# Patient Record
Sex: Female | Born: 1935
Health system: Southern US, Community
[De-identification: ages and names within clinical notes are randomized; demographics above are authoritative.]

## PROBLEM LIST (undated history)

## (undated) DIAGNOSIS — L661 Lichen planopilaris, unspecified: Secondary | ICD-10-CM

## (undated) DIAGNOSIS — I10 Essential (primary) hypertension: Secondary | ICD-10-CM

## (undated) DIAGNOSIS — Z8489 Family history of other specified conditions: Secondary | ICD-10-CM

## (undated) DIAGNOSIS — Z9889 Other specified postprocedural states: Secondary | ICD-10-CM

## (undated) DIAGNOSIS — B977 Papillomavirus as the cause of diseases classified elsewhere: Secondary | ICD-10-CM

## (undated) DIAGNOSIS — T8859XA Other complications of anesthesia, initial encounter: Secondary | ICD-10-CM

## (undated) DIAGNOSIS — Z9189 Other specified personal risk factors, not elsewhere classified: Secondary | ICD-10-CM

## (undated) DIAGNOSIS — K5909 Other constipation: Secondary | ICD-10-CM

## (undated) DIAGNOSIS — K5190 Ulcerative colitis, unspecified, without complications: Secondary | ICD-10-CM

## (undated) DIAGNOSIS — K529 Noninfective gastroenteritis and colitis, unspecified: Secondary | ICD-10-CM

## (undated) DIAGNOSIS — M549 Dorsalgia, unspecified: Secondary | ICD-10-CM

## (undated) DIAGNOSIS — R112 Nausea with vomiting, unspecified: Secondary | ICD-10-CM

## (undated) DIAGNOSIS — T4145XA Adverse effect of unspecified anesthetic, initial encounter: Secondary | ICD-10-CM

## (undated) DIAGNOSIS — M1990 Unspecified osteoarthritis, unspecified site: Secondary | ICD-10-CM

## (undated) DIAGNOSIS — M797 Fibromyalgia: Secondary | ICD-10-CM

## (undated) DIAGNOSIS — K219 Gastro-esophageal reflux disease without esophagitis: Secondary | ICD-10-CM

## (undated) HISTORY — PX: OOPHORECTOMY: SHX86

## (undated) HISTORY — DX: Essential (primary) hypertension: I10

## (undated) HISTORY — DX: Noninfective gastroenteritis and colitis, unspecified: K52.9

## (undated) HISTORY — DX: Lichen planopilaris, unspecified: L66.10

## (undated) HISTORY — PX: VAGINAL HYSTERECTOMY: SUR661

## (undated) HISTORY — PX: PELVIC LAPAROSCOPY: SHX162

## (undated) HISTORY — DX: Other specified personal risk factors, not elsewhere classified: Z91.89

## (undated) HISTORY — PX: BREAST SURGERY: SHX581

## (undated) HISTORY — DX: Dorsalgia, unspecified: M54.9

## (undated) HISTORY — PX: CATARACT EXTRACTION: SUR2

## (undated) HISTORY — PX: ABDOMINAL SURGERY: SHX537

## (undated) HISTORY — DX: Papillomavirus as the cause of diseases classified elsewhere: B97.7

## (undated) HISTORY — PX: OTHER SURGICAL HISTORY: SHX169

## (undated) HISTORY — DX: Unspecified osteoarthritis, unspecified site: M19.90

## (undated) HISTORY — DX: Ulcerative colitis, unspecified, without complications: K51.90

## (undated) HISTORY — DX: Lichen planopilaris: L66.1

---

## 1998-09-18 ENCOUNTER — Other Ambulatory Visit: Admission: RE | Admit: 1998-09-18 | Discharge: 1998-09-18 | Payer: Self-pay | Admitting: Obstetrics and Gynecology

## 2001-09-30 ENCOUNTER — Other Ambulatory Visit: Admission: RE | Admit: 2001-09-30 | Discharge: 2001-09-30 | Payer: Self-pay | Admitting: Obstetrics and Gynecology

## 2002-10-22 ENCOUNTER — Other Ambulatory Visit: Admission: RE | Admit: 2002-10-22 | Discharge: 2002-10-22 | Payer: Self-pay | Admitting: Obstetrics and Gynecology

## 2003-11-04 ENCOUNTER — Other Ambulatory Visit: Admission: RE | Admit: 2003-11-04 | Discharge: 2003-11-04 | Payer: Self-pay | Admitting: Obstetrics and Gynecology

## 2004-11-06 ENCOUNTER — Other Ambulatory Visit: Admission: RE | Admit: 2004-11-06 | Discharge: 2004-11-06 | Payer: Self-pay | Admitting: Obstetrics and Gynecology

## 2005-11-07 ENCOUNTER — Other Ambulatory Visit: Admission: RE | Admit: 2005-11-07 | Discharge: 2005-11-07 | Payer: Self-pay | Admitting: Obstetrics and Gynecology

## 2006-03-07 ENCOUNTER — Encounter: Admission: RE | Admit: 2006-03-07 | Discharge: 2006-03-07 | Payer: Self-pay | Admitting: Otolaryngology

## 2006-04-26 ENCOUNTER — Emergency Department (HOSPITAL_COMMUNITY): Admission: EM | Admit: 2006-04-26 | Discharge: 2006-04-26 | Payer: Self-pay | Admitting: Emergency Medicine

## 2006-11-10 ENCOUNTER — Other Ambulatory Visit: Admission: RE | Admit: 2006-11-10 | Discharge: 2006-11-10 | Payer: Self-pay | Admitting: Obstetrics and Gynecology

## 2007-11-18 ENCOUNTER — Other Ambulatory Visit: Admission: RE | Admit: 2007-11-18 | Discharge: 2007-11-18 | Payer: Self-pay | Admitting: Obstetrics and Gynecology

## 2008-11-21 ENCOUNTER — Ambulatory Visit: Payer: Self-pay | Admitting: Obstetrics and Gynecology

## 2008-11-21 ENCOUNTER — Other Ambulatory Visit: Admission: RE | Admit: 2008-11-21 | Discharge: 2008-11-21 | Payer: Self-pay | Admitting: Obstetrics and Gynecology

## 2008-11-21 ENCOUNTER — Encounter: Payer: Self-pay | Admitting: Obstetrics and Gynecology

## 2009-11-23 ENCOUNTER — Other Ambulatory Visit: Admission: RE | Admit: 2009-11-23 | Discharge: 2009-11-23 | Payer: Self-pay | Admitting: Obstetrics and Gynecology

## 2009-11-23 ENCOUNTER — Ambulatory Visit: Payer: Self-pay | Admitting: Obstetrics and Gynecology

## 2010-11-27 ENCOUNTER — Other Ambulatory Visit: Payer: Self-pay | Admitting: Obstetrics and Gynecology

## 2010-11-27 ENCOUNTER — Encounter (INDEPENDENT_AMBULATORY_CARE_PROVIDER_SITE_OTHER): Payer: Medicare Other | Admitting: Obstetrics and Gynecology

## 2010-11-27 ENCOUNTER — Other Ambulatory Visit (HOSPITAL_COMMUNITY)
Admission: RE | Admit: 2010-11-27 | Discharge: 2010-11-27 | Disposition: A | Discharge status: Home / Self Care | Payer: Medicare Other | Source: Ambulatory Visit | Attending: Obstetrics and Gynecology | Admitting: Obstetrics and Gynecology

## 2010-11-27 DIAGNOSIS — N766 Ulceration of vulva: Secondary | ICD-10-CM

## 2010-11-27 DIAGNOSIS — N393 Stress incontinence (female) (male): Secondary | ICD-10-CM

## 2010-11-27 DIAGNOSIS — N952 Postmenopausal atrophic vaginitis: Secondary | ICD-10-CM

## 2010-11-27 DIAGNOSIS — Z124 Encounter for screening for malignant neoplasm of cervix: Secondary | ICD-10-CM | POA: Insufficient documentation

## 2010-11-27 DIAGNOSIS — R82998 Other abnormal findings in urine: Secondary | ICD-10-CM

## 2010-12-26 ENCOUNTER — Other Ambulatory Visit: Payer: Self-pay | Admitting: Obstetrics and Gynecology

## 2011-05-16 ENCOUNTER — Other Ambulatory Visit: Payer: Self-pay | Admitting: Dermatology

## 2011-05-16 DIAGNOSIS — L659 Nonscarring hair loss, unspecified: Secondary | ICD-10-CM | POA: Diagnosis not present

## 2011-05-16 DIAGNOSIS — L253 Unspecified contact dermatitis due to other chemical products: Secondary | ICD-10-CM | POA: Diagnosis not present

## 2011-05-16 DIAGNOSIS — L259 Unspecified contact dermatitis, unspecified cause: Secondary | ICD-10-CM | POA: Diagnosis not present

## 2011-05-16 DIAGNOSIS — L219 Seborrheic dermatitis, unspecified: Secondary | ICD-10-CM | POA: Diagnosis not present

## 2011-05-30 DIAGNOSIS — L439 Lichen planus, unspecified: Secondary | ICD-10-CM | POA: Diagnosis not present

## 2011-05-30 DIAGNOSIS — Z79899 Other long term (current) drug therapy: Secondary | ICD-10-CM | POA: Diagnosis not present

## 2011-06-18 DIAGNOSIS — I8310 Varicose veins of unspecified lower extremity with inflammation: Secondary | ICD-10-CM | POA: Diagnosis not present

## 2011-08-15 DIAGNOSIS — H40019 Open angle with borderline findings, low risk, unspecified eye: Secondary | ICD-10-CM | POA: Diagnosis not present

## 2011-08-15 DIAGNOSIS — H179 Unspecified corneal scar and opacity: Secondary | ICD-10-CM | POA: Diagnosis not present

## 2011-08-15 DIAGNOSIS — H353190 Nonexudative age-related macular degeneration, unspecified eye, stage unspecified: Secondary | ICD-10-CM | POA: Diagnosis not present

## 2011-08-15 DIAGNOSIS — H43399 Other vitreous opacities, unspecified eye: Secondary | ICD-10-CM | POA: Diagnosis not present

## 2011-08-15 DIAGNOSIS — H2510 Age-related nuclear cataract, unspecified eye: Secondary | ICD-10-CM | POA: Diagnosis not present

## 2011-08-15 DIAGNOSIS — Z961 Presence of intraocular lens: Secondary | ICD-10-CM | POA: Diagnosis not present

## 2011-08-19 DIAGNOSIS — L439 Lichen planus, unspecified: Secondary | ICD-10-CM | POA: Diagnosis not present

## 2011-08-19 DIAGNOSIS — L299 Pruritus, unspecified: Secondary | ICD-10-CM | POA: Diagnosis not present

## 2011-09-04 DIAGNOSIS — M1710 Unilateral primary osteoarthritis, unspecified knee: Secondary | ICD-10-CM | POA: Diagnosis not present

## 2011-09-04 DIAGNOSIS — M76899 Other specified enthesopathies of unspecified lower limb, excluding foot: Secondary | ICD-10-CM | POA: Diagnosis not present

## 2011-11-18 DIAGNOSIS — L439 Lichen planus, unspecified: Secondary | ICD-10-CM | POA: Diagnosis not present

## 2011-11-19 ENCOUNTER — Encounter: Payer: Self-pay | Admitting: Gynecology

## 2011-11-19 DIAGNOSIS — M1990 Unspecified osteoarthritis, unspecified site: Secondary | ICD-10-CM | POA: Insufficient documentation

## 2011-11-28 DIAGNOSIS — H1045 Other chronic allergic conjunctivitis: Secondary | ICD-10-CM | POA: Diagnosis not present

## 2011-11-28 DIAGNOSIS — H40019 Open angle with borderline findings, low risk, unspecified eye: Secondary | ICD-10-CM | POA: Diagnosis not present

## 2011-11-28 DIAGNOSIS — H179 Unspecified corneal scar and opacity: Secondary | ICD-10-CM | POA: Diagnosis not present

## 2011-12-03 DIAGNOSIS — M1710 Unilateral primary osteoarthritis, unspecified knee: Secondary | ICD-10-CM | POA: Diagnosis not present

## 2011-12-09 DIAGNOSIS — Z1231 Encounter for screening mammogram for malignant neoplasm of breast: Secondary | ICD-10-CM | POA: Diagnosis not present

## 2011-12-12 ENCOUNTER — Encounter: Payer: Self-pay | Admitting: Obstetrics and Gynecology

## 2011-12-12 ENCOUNTER — Ambulatory Visit (INDEPENDENT_AMBULATORY_CARE_PROVIDER_SITE_OTHER): Payer: Medicare Other | Admitting: Obstetrics and Gynecology

## 2011-12-12 VITALS — BP 156/98 | Ht 62.0 in | Wt 174.0 lb

## 2011-12-12 DIAGNOSIS — N760 Acute vaginitis: Secondary | ICD-10-CM | POA: Diagnosis not present

## 2011-12-12 DIAGNOSIS — N9089 Other specified noninflammatory disorders of vulva and perineum: Secondary | ICD-10-CM | POA: Diagnosis not present

## 2011-12-12 DIAGNOSIS — L661 Lichen planopilaris: Secondary | ICD-10-CM | POA: Insufficient documentation

## 2011-12-12 DIAGNOSIS — N393 Stress incontinence (female) (male): Secondary | ICD-10-CM

## 2011-12-12 DIAGNOSIS — I10 Essential (primary) hypertension: Secondary | ICD-10-CM | POA: Insufficient documentation

## 2011-12-12 DIAGNOSIS — N952 Postmenopausal atrophic vaginitis: Secondary | ICD-10-CM | POA: Diagnosis not present

## 2011-12-12 MED ORDER — CLOBETASOL PROPIONATE 0.05 % EX CREA: TOPICAL_CREAM | Freq: Two times a day (BID) | CUTANEOUS | Status: DC

## 2011-12-12 NOTE — Progress Notes (Signed)
Patient came to see me today for further followup. She has recurrent episodes of vulvitis and uses Temovate ointment as needed. She is having no vaginal bleeding. She is having no pelvic pain. She is having trouble with her blood pressure and was slightly elevated today. She is working with Dr. Valentina Lucks. She does have atrophic vaginitis but does well without medication. She is not sexually active. She does have loss of urine with coughing and sneezing but not enough to require surgical intervention. She has done Kegel exercises. She took DES when she was pregnant with her daughter. She has never had an abnormal Pap smear. She had a vaginal hysterectomy for dysfunctional uterine bleeding. She has had normal mammogram. She has had normal bone densities. She is getting ready to have knee replacement surgery due to osteoarthritis.  ROS: 12 system review done. Pertinent positives above.  HEENT: Within normal limits.Kennon Portela present. Neck: No masses. Supraclavicular lymph nodes: Not enlarged. Breasts: Examined in both sitting and lying position. Symmetrical without skin changes or masses. Abdomen: Soft no masses guarding or rebound. No hernias. Pelvic: External within normal limits. BUS within normal limits. Vaginal examination shows poor  estrogen effect, no cystocele enterocele or rectocele. Cervix and uterus absent. Adnexa within normal limits. Rectovaginal confirmatory. Extremities within normal limits.  Assessment: #1. Vulvitis #2. Hypertension #3. Urinary stress incontinence #4. Atrophic vaginitis #5. DES exposure during pregnancy.  Plan: Continue Temovate ointment. Continue yearly mammogram. Hypertensive followup with Dr. Valentina Lucks.

## 2011-12-13 ENCOUNTER — Encounter: Payer: Self-pay | Admitting: Obstetrics and Gynecology

## 2011-12-30 DIAGNOSIS — I8310 Varicose veins of unspecified lower extremity with inflammation: Secondary | ICD-10-CM | POA: Diagnosis not present

## 2012-01-16 DIAGNOSIS — I8310 Varicose veins of unspecified lower extremity with inflammation: Secondary | ICD-10-CM | POA: Diagnosis not present

## 2012-01-16 DIAGNOSIS — M79609 Pain in unspecified limb: Secondary | ICD-10-CM | POA: Diagnosis not present

## 2012-01-31 DIAGNOSIS — M79609 Pain in unspecified limb: Secondary | ICD-10-CM | POA: Diagnosis not present

## 2012-02-25 DIAGNOSIS — Z23 Encounter for immunization: Secondary | ICD-10-CM | POA: Diagnosis not present

## 2012-02-27 DIAGNOSIS — M79609 Pain in unspecified limb: Secondary | ICD-10-CM | POA: Diagnosis not present

## 2012-02-27 DIAGNOSIS — I8310 Varicose veins of unspecified lower extremity with inflammation: Secondary | ICD-10-CM | POA: Diagnosis not present

## 2012-03-12 DIAGNOSIS — M79609 Pain in unspecified limb: Secondary | ICD-10-CM | POA: Diagnosis not present

## 2012-03-12 DIAGNOSIS — I8310 Varicose veins of unspecified lower extremity with inflammation: Secondary | ICD-10-CM | POA: Diagnosis not present

## 2012-03-12 DIAGNOSIS — M7981 Nontraumatic hematoma of soft tissue: Secondary | ICD-10-CM | POA: Diagnosis not present

## 2012-03-26 ENCOUNTER — Other Ambulatory Visit: Payer: Self-pay | Admitting: Orthopedic Surgery

## 2012-03-26 MED ORDER — DEXAMETHASONE SODIUM PHOSPHATE 10 MG/ML IJ SOLN: 10.0000 mg | Freq: Once | INTRAMUSCULAR | Status: DC

## 2012-03-26 MED ORDER — BUPIVACAINE 0.25 % ON-Q PUMP SINGLE CATH 300ML: 300.0000 mL | INJECTION | Status: DC

## 2012-03-26 NOTE — Progress Notes (Signed)
Preoperative surgical orders have been place into the Epic hospital system for Rock Nephew on 03/26/2012, 11:40 AM  by Patrica Duel for surgery on 05/25/12.  Preop Total Knee orders including Bupivacaine On-Q pump, IV Tylenol, and IV Decadron as long as there are no contraindications to the above medications. Avel Peace, PA-C

## 2012-04-02 DIAGNOSIS — E559 Vitamin D deficiency, unspecified: Secondary | ICD-10-CM | POA: Diagnosis not present

## 2012-04-02 DIAGNOSIS — Z0000 Encounter for general adult medical examination without abnormal findings: Secondary | ICD-10-CM | POA: Diagnosis not present

## 2012-04-02 DIAGNOSIS — K219 Gastro-esophageal reflux disease without esophagitis: Secondary | ICD-10-CM | POA: Diagnosis not present

## 2012-04-02 DIAGNOSIS — I10 Essential (primary) hypertension: Secondary | ICD-10-CM | POA: Diagnosis not present

## 2012-05-11 ENCOUNTER — Other Ambulatory Visit: Payer: Self-pay | Admitting: Orthopedic Surgery

## 2012-05-11 NOTE — H&P (Signed)
Crystal Ramsey  DOB: 10/06/1935 Married / Language: English / Race: White Female  Date of Admission:  05/25/2012  Chief Complaint:  Right Knee Pain  History of Present Illness The patient is a 76 year old female who comes in for a preoperative History and Physical. The patient is scheduled for a right total knee arthroplasty to be performed by Dr. Frank V. Aluisio, MD at [facility] on 05/25/2012. The patient is a 76 year old female who presents for follow up of their knee. The patient is being followed for their right osteoarthritis. They are now 4 month(s) out from last injection. Symptoms reported today include: pain. The patient feels that they are doing poorly and report their pain level to be mild to moderate. The patient has reported improvement of their symptoms with: conservative measures and Cortisone injections. Note for "Follow-up Knee": She is scheduled for knee replacement by Dr. Aluisio. She states that she's had multiple injections in the past with Crystal Ramsey. She's had an injection every couple of months. She said the knee is not having a tremendous amount of pain but is leading to significant dysfunction. Prior to the previous cortisone injections, she did have more pain. She's at a stage now where the knee is definitely limiting what she can and can't do. She is ready to get it fixed. They have been treated conservatively in the past for the above stated problem and despite conservative measures, they continue to have progressive pain and severe functional limitations and dysfunction. They have failed non-operative management including home exercise, medications, and injections. It is felt that they would benefit from undergoing total joint replacement. Risks and benefits of the procedure have been discussed with the patient and they elect to proceed with surgery. There are no active contraindications to surgery such as ongoing infection or rapidly  progressive neurological disease.   Problem List Osteoarthrosis NOS, lower leg (715.96). 07/13/1987   Allergies Dyes. eye exam dyes Demerol *ANALGESICS - OPIOID* Sulfa Drugs Zithromax *MACROLIDES* Dilaudid *ANALGESICS - OPIOID* Penicillins   Family History Osteoporosis. sister Cancer. mother, sister and brother Osteoarthritis. father, sister, brother and grandmother mothers side Hypertension. father and sister   Social History Living situation. live with spouse Illicit drug use. no Previously in rehab. no Pain Contract. no Exercise. Exercises rarely Children. 1 Alcohol use. current drinker; drinks wine; 8-14 per week Drug/Alcohol Rehab (Currently). no Current work status. retired Tobacco / smoke exposure. yes outdoors only Number of flights of stairs before winded. less than 1 Marital status. married Tobacco use. former smoker; smoke(d) 3 or more pack(s) per day Post-Surgical Plans. Plan is to look into skilled rehab facility. Advance Directives. Living Will, Healthcare POA   Medication History Amiloride-Hydrochlorothiazide (5-50MG Tablet, Oral) Active. Aspirin Buffered (325MG Tablet, Oral) Active. PriLOSEC (20MG Capsule DR, Oral) Active. Benadryl Allergy (12.5MG/5ML Liquid, Oral) Active. Potassium (99MG Tablet, Oral) Active. Vitamin D (1000UNIT Capsule, Oral) Active. Multivital ( Oral) Active. Senna ( Oral) Active. Tums ( Oral) Specific dose unknown - Active. Patanol (0.1% Solution, Ophthalmic) Active.   Pregnancy / Birth History Pregnant. no   Past Surgical History Breast Biopsy. right Dilation and Curettage of Uterus Cataract Surgery. left Hysterectomy. partial (non-cancerous) Tonsillectomy   Medical History Osteoarthritis Chronic Pain Autoimmune disorder Gastroesophageal Reflux Disease High blood pressure Fibromyalgia Depression Impaired Hearing Cataract Varicose veins Menopause Mumps   Review of  Systems General:Not Present- Chills, Fever, Night Sweats, Fatigue, Weight Gain, Weight Loss and Memory Loss. Skin:Not Present- Hives, Itching, Rash, Eczema and   Lesions. HEENT:Present- Hearing Loss. Not Present- Tinnitus, Headache, Double Vision, Visual Loss and Dentures. Respiratory:Present- Shortness of breath with exertion. Not Present- Shortness of breath at rest, Allergies, Coughing up blood and Chronic Cough. Cardiovascular:Not Present- Chest Pain, Racing/skipping heartbeats, Difficulty Breathing Lying Down, Murmur, Swelling and Palpitations. Gastrointestinal:Present- Heartburn and Constipation. Not Present- Bloody Stool, Abdominal Pain, Vomiting, Nausea, Diarrhea, Difficulty Swallowing, Jaundice and Loss of appetitie. Female Genitourinary:Not Present- Blood in Urine, Urinary frequency, Weak urinary stream, Discharge, Flank Pain, Incontinence, Painful Urination, Urgency, Urinary Retention and Urinating at Night. Musculoskeletal:Present- Muscle Pain, Joint Swelling, Joint Pain, Back Pain and Morning Stiffness. Not Present- Muscle Weakness and Spasms. Neurological:Not Present- Tremor, Dizziness, Blackout spells, Paralysis, Difficulty with balance and Weakness. Psychiatric:Not Present- Insomnia.   Vitals Weight: 165 lb Height: 62.5 in Body Surface Area: 1.82 m Body Mass Index: 29.7 kg/m Pulse: 72 (Regular) Resp.: 12 (Unlabored) BP: 182/88 (Sitting, Left Arm, Standard)    Physical Exam The physical exam findings are as follows:  Note: Patient is a 76 year old female with continued knee pain.   General Mental Status - Alert, cooperative and good historian. General Appearance- pleasant. Not in acute distress. Orientation- Oriented X3. Build & Nutrition- Well nourished and Well developed.   Head and Neck Head- normocephalic, atraumatic . Neck Global Assessment- supple. no bruit auscultated on the right and no bruit auscultated on the  left.   Eye Pupil- Bilateral- Regular and Round. Note: wears glasses Motion- Bilateral- EOMI.   Chest and Lung Exam Auscultation: Breath sounds:- clear at anterior chest wall and - clear at posterior chest wall. Adventitious sounds:- No Adventitious sounds.   Cardiovascular Auscultation:Rhythm- Regular rate and rhythm. Heart Sounds- S1 WNL and S2 WNL. Murmurs & Other Heart Sounds:Auscultation of the heart reveals - No Murmurs.   Abdomen Palpation/Percussion:Tenderness- Abdomen is non-tender to palpation. Rigidity (guarding)- Abdomen is soft. Auscultation:Auscultation of the abdomen reveals - Bowel sounds normal.   Female Genitourinary Not done, not pertinent to present illness  Musculoskeletal On exam, well-developed female, alert and oriented, in no apparent distress. Both hips have normal ROM with no discomfort. Her left knee has no effusion. Range is about 0-135 but no swelling, tenderness or instability.  Right knee: No effusion. Range is 10-120. Marked crepitus on ROM. Varus deformity. Tender medial greater than lateral with no instability.  Pulses, sensation and motor are intact, both lower extremities.  RADIOGRAPHS: Radiographs are reviewed from last December, AP of both knees and lateral of the right, showing severe end stage arthritis in the medial and patellofemoral compartments of the right knee.  Assessment & Plan Osteoarthrosis NOS, lower leg (715.96) Impression: Left Knee  Note: Plan is for a Right Total Knee Repalcement by Dr. Aluisio.  Plan is to look into Rehab.  PCP - Dr. John Griffin - Patient has been seen preoperatively and felt to be stable for surgery.  Signed electronically by DREW L PERKINS, PA-C  

## 2012-05-12 ENCOUNTER — Encounter (HOSPITAL_COMMUNITY): Payer: Self-pay | Admitting: Pharmacy Technician

## 2012-05-15 ENCOUNTER — Ambulatory Visit (HOSPITAL_COMMUNITY)
Admission: RE | Admit: 2012-05-15 | Discharge: 2012-05-15 | Disposition: A | Discharge status: Home / Self Care | Payer: Medicare Other | Source: Ambulatory Visit | Attending: Orthopedic Surgery | Admitting: Orthopedic Surgery

## 2012-05-15 ENCOUNTER — Encounter (HOSPITAL_COMMUNITY): Payer: Self-pay

## 2012-05-15 ENCOUNTER — Encounter (HOSPITAL_COMMUNITY)
Admission: RE | Admit: 2012-05-15 | Discharge: 2012-05-15 | Disposition: A | Discharge status: Home / Self Care | Payer: Medicare Other | Source: Ambulatory Visit | Attending: Orthopedic Surgery | Admitting: Orthopedic Surgery

## 2012-05-15 DIAGNOSIS — R918 Other nonspecific abnormal finding of lung field: Secondary | ICD-10-CM | POA: Diagnosis not present

## 2012-05-15 DIAGNOSIS — R9431 Abnormal electrocardiogram [ECG] [EKG]: Secondary | ICD-10-CM | POA: Diagnosis not present

## 2012-05-15 DIAGNOSIS — Z01810 Encounter for preprocedural cardiovascular examination: Secondary | ICD-10-CM | POA: Diagnosis not present

## 2012-05-15 DIAGNOSIS — I10 Essential (primary) hypertension: Secondary | ICD-10-CM | POA: Diagnosis not present

## 2012-05-15 DIAGNOSIS — Z87891 Personal history of nicotine dependence: Secondary | ICD-10-CM | POA: Diagnosis not present

## 2012-05-15 DIAGNOSIS — Z01818 Encounter for other preprocedural examination: Secondary | ICD-10-CM | POA: Insufficient documentation

## 2012-05-15 DIAGNOSIS — I517 Cardiomegaly: Secondary | ICD-10-CM | POA: Insufficient documentation

## 2012-05-15 DIAGNOSIS — Z01812 Encounter for preprocedural laboratory examination: Secondary | ICD-10-CM | POA: Insufficient documentation

## 2012-05-15 HISTORY — DX: Other constipation: K59.09

## 2012-05-15 HISTORY — DX: Other specified postprocedural states: R11.2

## 2012-05-15 HISTORY — DX: Adverse effect of unspecified anesthetic, initial encounter: T41.45XA

## 2012-05-15 HISTORY — DX: Gastro-esophageal reflux disease without esophagitis: K21.9

## 2012-05-15 HISTORY — DX: Other complications of anesthesia, initial encounter: T88.59XA

## 2012-05-15 HISTORY — DX: Other specified postprocedural states: Z98.890

## 2012-05-15 HISTORY — DX: Fibromyalgia: M79.7

## 2012-05-15 LAB — URINALYSIS, ROUTINE W REFLEX MICROSCOPIC
Ketones, ur: NEGATIVE mg/dL
Nitrite: NEGATIVE
Protein, ur: NEGATIVE mg/dL
Urobilinogen, UA: 0.2 mg/dL (ref 0.0–1.0)
pH: 5.5 (ref 5.0–8.0)

## 2012-05-15 LAB — CBC
MCH: 31.7 pg (ref 26.0–34.0)
MCV: 94.3 fL (ref 78.0–100.0)
Platelets: 272 10*3/uL (ref 150–400)
RBC: 4.35 MIL/uL (ref 3.87–5.11)
RDW: 13.3 % (ref 11.5–15.5)

## 2012-05-15 LAB — COMPREHENSIVE METABOLIC PANEL
AST: 20 U/L (ref 0–37)
CO2: 31 mEq/L (ref 19–32)
Calcium: 9.7 mg/dL (ref 8.4–10.5)
Creatinine, Ser: 0.65 mg/dL (ref 0.50–1.10)
GFR calc non Af Amer: 85 mL/min — ABNORMAL LOW (ref >90)

## 2012-05-15 LAB — URINE MICROSCOPIC-ADD ON

## 2012-05-15 LAB — PROTIME-INR: INR: 0.97 (ref 0.00–1.49)

## 2012-05-15 NOTE — Patient Instructions (Addendum)
Crystal Ramsey  05/15/2012                           YOUR PROCEDURE IS SCHEDULED ON:  05/25/12               PLEASE REPORT TO SHORT STAY CENTER AT :  5:15 AM               CALL THIS NUMBER IF ANY PROBLEMS THE DAY OF SURGERY :               [phone]                      REMEMBER:   Do not eat food or drink liquids AFTER MIDNIGHT   Take these medicines the morning of surgery with A SIP OF WATER:  PRILOSEC   Do not wear jewelry, make-up   Do not wear lotions, powders, or perfumes.   Do not shave legs or underarms 12 hrs. before surgery (men may shave face)  Do not bring valuables to the hospital.  Contacts, dentures or bridgework may not be worn into surgery.  Leave suitcase in the car. After surgery it may be brought to your room.  For patients admitted to the hospital more than one night, checkout time is 11:00                          The day of discharge.   Patients discharged the day of surgery will not be allowed to drive home                             If going home same day of surgery, must have someone stay with you first                           24 hrs at home and arrange for some one to drive you home from hospital.    Special Instructions:   Please read over the following fact sheets that you were given:               1. MRSA  INFORMATION                      2. [name] PREPARING FOR SURGERY SHEET               3. INCENTIVE SPIROMETER                                                X_____________________________________________________________________        Failure to follow these instructions may result in cancellation of your surgery

## 2012-05-15 NOTE — Progress Notes (Signed)
05/15/12 1128  OBSTRUCTIVE SLEEP APNEA  Have you ever been diagnosed with sleep apnea through a sleep study? No  Do you snore loudly (loud enough to be heard through closed doors)?  0  Do you often feel tired, fatigued, or sleepy during the daytime? 1  Has anyone observed you stop breathing during your sleep? 1  Do you have, or are you being treated for high blood pressure? 1  BMI more than 35 kg/m2? 0  Age over 75 years old? 1  Neck circumference greater than 40 cm/18 inches? 0  Gender: 0  Obstructive Sleep Apnea Score 4   Score 4 or greater  Results sent to PCP

## 2012-05-15 NOTE — Progress Notes (Signed)
CXR and UA faxed to Dr. Lequita Halt - confirmation recieved

## 2012-05-18 DIAGNOSIS — I10 Essential (primary) hypertension: Secondary | ICD-10-CM | POA: Diagnosis not present

## 2012-05-20 NOTE — Progress Notes (Signed)
Received order from Dr. Lequita Halt to repeat CXR the morning of surgery. Order placed into EPIC

## 2012-05-25 ENCOUNTER — Encounter (HOSPITAL_COMMUNITY)
Admission: RE | Disposition: A | Discharge status: Skilled Nursing Facility | Payer: Self-pay | Source: Ambulatory Visit | Attending: Orthopedic Surgery

## 2012-05-25 ENCOUNTER — Encounter (HOSPITAL_COMMUNITY): Payer: Self-pay | Admitting: Orthopedic Surgery

## 2012-05-25 ENCOUNTER — Inpatient Hospital Stay (HOSPITAL_COMMUNITY)
Admission: RE | Admit: 2012-05-25 | Discharge: 2012-05-28 | DRG: 470 | Disposition: A | Discharge status: Skilled Nursing Facility | Payer: Medicare Other | Source: Ambulatory Visit | Attending: Orthopedic Surgery | Admitting: Orthopedic Surgery

## 2012-05-25 ENCOUNTER — Encounter (HOSPITAL_COMMUNITY): Payer: Self-pay | Admitting: *Deleted

## 2012-05-25 ENCOUNTER — Inpatient Hospital Stay (HOSPITAL_COMMUNITY): Payer: Medicare Other

## 2012-05-25 ENCOUNTER — Inpatient Hospital Stay (HOSPITAL_COMMUNITY): Payer: Medicare Other | Admitting: *Deleted

## 2012-05-25 DIAGNOSIS — F329 Major depressive disorder, single episode, unspecified: Secondary | ICD-10-CM | POA: Diagnosis present

## 2012-05-25 DIAGNOSIS — Z471 Aftercare following joint replacement surgery: Secondary | ICD-10-CM | POA: Diagnosis not present

## 2012-05-25 DIAGNOSIS — Z79899 Other long term (current) drug therapy: Secondary | ICD-10-CM

## 2012-05-25 DIAGNOSIS — K219 Gastro-esophageal reflux disease without esophagitis: Secondary | ICD-10-CM | POA: Diagnosis present

## 2012-05-25 DIAGNOSIS — IMO0002 Reserved for concepts with insufficient information to code with codable children: Secondary | ICD-10-CM | POA: Diagnosis not present

## 2012-05-25 DIAGNOSIS — M359 Systemic involvement of connective tissue, unspecified: Secondary | ICD-10-CM | POA: Diagnosis present

## 2012-05-25 DIAGNOSIS — E871 Hypo-osmolality and hyponatremia: Secondary | ICD-10-CM

## 2012-05-25 DIAGNOSIS — Z880 Allergy status to penicillin: Secondary | ICD-10-CM | POA: Diagnosis not present

## 2012-05-25 DIAGNOSIS — R269 Unspecified abnormalities of gait and mobility: Secondary | ICD-10-CM | POA: Diagnosis not present

## 2012-05-25 DIAGNOSIS — IMO0001 Reserved for inherently not codable concepts without codable children: Secondary | ICD-10-CM | POA: Diagnosis present

## 2012-05-25 DIAGNOSIS — L439 Lichen planus, unspecified: Secondary | ICD-10-CM | POA: Diagnosis present

## 2012-05-25 DIAGNOSIS — G8929 Other chronic pain: Secondary | ICD-10-CM | POA: Diagnosis present

## 2012-05-25 DIAGNOSIS — J984 Other disorders of lung: Secondary | ICD-10-CM | POA: Diagnosis not present

## 2012-05-25 DIAGNOSIS — M179 Osteoarthritis of knee, unspecified: Secondary | ICD-10-CM

## 2012-05-25 DIAGNOSIS — M1990 Unspecified osteoarthritis, unspecified site: Secondary | ICD-10-CM | POA: Diagnosis not present

## 2012-05-25 DIAGNOSIS — D62 Acute posthemorrhagic anemia: Secondary | ICD-10-CM

## 2012-05-25 DIAGNOSIS — I10 Essential (primary) hypertension: Secondary | ICD-10-CM | POA: Diagnosis not present

## 2012-05-25 DIAGNOSIS — Z96659 Presence of unspecified artificial knee joint: Secondary | ICD-10-CM | POA: Diagnosis not present

## 2012-05-25 DIAGNOSIS — M1710 Unilateral primary osteoarthritis, unspecified knee: Secondary | ICD-10-CM | POA: Diagnosis not present

## 2012-05-25 DIAGNOSIS — E876 Hypokalemia: Secondary | ICD-10-CM

## 2012-05-25 DIAGNOSIS — Z01818 Encounter for other preprocedural examination: Secondary | ICD-10-CM | POA: Diagnosis not present

## 2012-05-25 DIAGNOSIS — M6281 Muscle weakness (generalized): Secondary | ICD-10-CM | POA: Diagnosis not present

## 2012-05-25 DIAGNOSIS — M25569 Pain in unspecified knee: Secondary | ICD-10-CM | POA: Diagnosis not present

## 2012-05-25 DIAGNOSIS — K2100 Gastro-esophageal reflux disease with esophagitis, without bleeding: Secondary | ICD-10-CM | POA: Diagnosis not present

## 2012-05-25 DIAGNOSIS — F3289 Other specified depressive episodes: Secondary | ICD-10-CM | POA: Diagnosis present

## 2012-05-25 HISTORY — PX: TOTAL KNEE ARTHROPLASTY: SHX125

## 2012-05-25 LAB — ABO/RH: ABO/RH(D): O POS

## 2012-05-25 SURGERY — ARTHROPLASTY, KNEE, TOTAL
Anesthesia: Spinal | Site: Knee | Laterality: Right | Wound class: Clean

## 2012-05-25 MED ORDER — ONDANSETRON HCL 4 MG/2ML IJ SOLN
4.0000 mg | Freq: Four times a day (QID) | INTRAMUSCULAR | Status: DC | PRN
  Administered 2012-05-25 – 2012-05-26 (×3): 4 mg via INTRAVENOUS
  Filled 2012-05-25 (×3): qty 2

## 2012-05-25 MED ORDER — DOCUSATE SODIUM 100 MG PO CAPS
100.0000 mg | ORAL_CAPSULE | Freq: Two times a day (BID) | ORAL | Status: DC
  Administered 2012-05-25 – 2012-05-28 (×5): 100 mg via ORAL

## 2012-05-25 MED ORDER — OXYCODONE HCL 5 MG PO TABS
5.0000 mg | ORAL_TABLET | ORAL | Status: DC | PRN
  Administered 2012-05-25 – 2012-05-26 (×4): 10 mg via ORAL
  Filled 2012-05-25 (×4): qty 2

## 2012-05-25 MED ORDER — RIVAROXABAN 10 MG PO TABS
10.0000 mg | ORAL_TABLET | Freq: Every day | ORAL | Status: DC
  Administered 2012-05-26 – 2012-05-28 (×3): 10 mg via ORAL
  Filled 2012-05-25 (×5): qty 1

## 2012-05-25 MED ORDER — BUPIVACAINE LIPOSOME 1.3 % IJ SUSP
20.0000 mL | Freq: Once | INTRAMUSCULAR | Status: AC
  Administered 2012-05-25: 20 mL
  Filled 2012-05-25: qty 20

## 2012-05-25 MED ORDER — PROMETHAZINE HCL 25 MG/ML IJ SOLN: 6.2500 mg | INTRAMUSCULAR | Status: DC | PRN

## 2012-05-25 MED ORDER — DEXAMETHASONE SODIUM PHOSPHATE 10 MG/ML IJ SOLN: 10.0000 mg | Freq: Once | INTRAMUSCULAR | Status: AC

## 2012-05-25 MED ORDER — METOCLOPRAMIDE HCL 10 MG PO TABS: 5.0000 mg | ORAL_TABLET | Freq: Three times a day (TID) | ORAL | Status: DC | PRN

## 2012-05-25 MED ORDER — CLOBETASOL PROPIONATE 0.05 % EX CREA
TOPICAL_CREAM | Freq: Two times a day (BID) | CUTANEOUS | Status: DC
  Filled 2012-05-25: qty 15

## 2012-05-25 MED ORDER — FENTANYL CITRATE 0.05 MG/ML IJ SOLN
INTRAMUSCULAR | Status: DC | PRN
  Administered 2012-05-25 (×2): 50 ug via INTRAVENOUS

## 2012-05-25 MED ORDER — OLOPATADINE HCL 0.1 % OP SOLN
1.0000 [drp] | Freq: Two times a day (BID) | OPHTHALMIC | Status: DC
  Filled 2012-05-25: qty 5

## 2012-05-25 MED ORDER — POLYETHYLENE GLYCOL 3350 17 G PO PACK: 17.0000 g | PACK | Freq: Every day | ORAL | Status: DC | PRN

## 2012-05-25 MED ORDER — MORPHINE SULFATE 2 MG/ML IJ SOLN
INTRAMUSCULAR | Status: AC
  Administered 2012-05-25: 1 mg via INTRAVENOUS
  Filled 2012-05-25: qty 1

## 2012-05-25 MED ORDER — LACTATED RINGERS IV SOLN: INTRAVENOUS | Status: DC

## 2012-05-25 MED ORDER — MORPHINE SULFATE 2 MG/ML IJ SOLN
1.0000 mg | INTRAMUSCULAR | Status: DC | PRN
  Administered 2012-05-25: 1 mg via INTRAVENOUS
  Administered 2012-05-25: 2 mg via INTRAVENOUS
  Filled 2012-05-25: qty 1

## 2012-05-25 MED ORDER — CHLORHEXIDINE GLUCONATE 4 % EX LIQD
60.0000 mL | Freq: Once | CUTANEOUS | Status: DC
  Filled 2012-05-25: qty 60

## 2012-05-25 MED ORDER — KCL IN DEXTROSE-NACL 20-5-0.9 MEQ/L-%-% IV SOLN
INTRAVENOUS | Status: DC
  Administered 2012-05-25 – 2012-05-26 (×2): via INTRAVENOUS
  Filled 2012-05-25 (×3): qty 1000

## 2012-05-25 MED ORDER — OMEPRAZOLE MAGNESIUM 20 MG PO TBEC: 20.0000 mg | DELAYED_RELEASE_TABLET | ORAL | Status: DC

## 2012-05-25 MED ORDER — BISACODYL 10 MG RE SUPP: 10.0000 mg | Freq: Every day | RECTAL | Status: DC | PRN

## 2012-05-25 MED ORDER — DEXAMETHASONE 6 MG PO TABS
10.0000 mg | ORAL_TABLET | Freq: Once | ORAL | Status: AC
  Administered 2012-05-26: 10 mg via ORAL
  Filled 2012-05-25: qty 1

## 2012-05-25 MED ORDER — METHOCARBAMOL 500 MG PO TABS
500.0000 mg | ORAL_TABLET | Freq: Four times a day (QID) | ORAL | Status: DC | PRN
  Administered 2012-05-25 – 2012-05-26 (×3): 500 mg via ORAL
  Filled 2012-05-25 (×5): qty 1

## 2012-05-25 MED ORDER — ACETAMINOPHEN 650 MG RE SUPP: 650.0000 mg | Freq: Four times a day (QID) | RECTAL | Status: DC | PRN

## 2012-05-25 MED ORDER — POTASSIUM CHLORIDE 20 MEQ PO PACK: 20.0000 meq | PACK | Freq: Every day | ORAL | Status: DC

## 2012-05-25 MED ORDER — SODIUM CHLORIDE 0.9 % IR SOLN
Status: DC | PRN
  Administered 2012-05-25: 1000 mL

## 2012-05-25 MED ORDER — ACETAMINOPHEN 10 MG/ML IV SOLN
1000.0000 mg | Freq: Four times a day (QID) | INTRAVENOUS | Status: AC
  Administered 2012-05-25 – 2012-05-26 (×4): 1000 mg via INTRAVENOUS
  Filled 2012-05-25 (×8): qty 100

## 2012-05-25 MED ORDER — POTASSIUM CHLORIDE CRYS ER 20 MEQ PO TBCR
20.0000 meq | EXTENDED_RELEASE_TABLET | Freq: Every day | ORAL | Status: DC
  Administered 2012-05-28: 20 meq via ORAL
  Filled 2012-05-25 (×4): qty 1

## 2012-05-25 MED ORDER — AMILORIDE-HYDROCHLOROTHIAZIDE 5-50 MG PO TABS
0.5000 | ORAL_TABLET | Freq: Every day | ORAL | Status: DC
  Administered 2012-05-26 – 2012-05-27 (×2): 0.5 via ORAL

## 2012-05-25 MED ORDER — METOCLOPRAMIDE HCL 5 MG/ML IJ SOLN
5.0000 mg | Freq: Three times a day (TID) | INTRAMUSCULAR | Status: DC | PRN
  Administered 2012-05-25: 10 mg via INTRAVENOUS
  Filled 2012-05-25: qty 2

## 2012-05-25 MED ORDER — ONDANSETRON HCL 4 MG/2ML IJ SOLN
INTRAMUSCULAR | Status: DC | PRN
  Administered 2012-05-25: 4 mg via INTRAVENOUS

## 2012-05-25 MED ORDER — VANCOMYCIN HCL IN DEXTROSE 1-5 GM/200ML-% IV SOLN
1000.0000 mg | Freq: Once | INTRAVENOUS | Status: AC
  Administered 2012-05-25: 1000 mg via INTRAVENOUS
  Filled 2012-05-25: qty 200

## 2012-05-25 MED ORDER — ACETAMINOPHEN 10 MG/ML IV SOLN: INTRAVENOUS | Status: DC | PRN

## 2012-05-25 MED ORDER — FLEET ENEMA 7-19 GM/118ML RE ENEM: 1.0000 | ENEMA | Freq: Once | RECTAL | Status: AC | PRN

## 2012-05-25 MED ORDER — ACETAMINOPHEN 10 MG/ML IV SOLN
1000.0000 mg | Freq: Once | INTRAVENOUS | Status: AC
  Administered 2012-05-25: 1000 mg via INTRAVENOUS

## 2012-05-25 MED ORDER — ONDANSETRON HCL 4 MG PO TABS: 4.0000 mg | ORAL_TABLET | Freq: Four times a day (QID) | ORAL | Status: DC | PRN

## 2012-05-25 MED ORDER — METHOCARBAMOL 100 MG/ML IJ SOLN
500.0000 mg | Freq: Four times a day (QID) | INTRAVENOUS | Status: DC | PRN
  Administered 2012-05-25: 500 mg via INTRAVENOUS
  Filled 2012-05-25: qty 5

## 2012-05-25 MED ORDER — DIPHENHYDRAMINE HCL 12.5 MG/5ML PO ELIX: 12.5000 mg | ORAL_SOLUTION | ORAL | Status: DC | PRN

## 2012-05-25 MED ORDER — BUPIVACAINE IN DEXTROSE 0.75-8.25 % IT SOLN
INTRATHECAL | Status: DC | PRN
  Administered 2012-05-25: 1.5 mL via INTRATHECAL

## 2012-05-25 MED ORDER — SODIUM CHLORIDE 0.9 % IV SOLN: INTRAVENOUS | Status: DC

## 2012-05-25 MED ORDER — MIDAZOLAM HCL 5 MG/5ML IJ SOLN
INTRAMUSCULAR | Status: DC | PRN
  Administered 2012-05-25: 2 mg via INTRAVENOUS

## 2012-05-25 MED ORDER — LACTATED RINGERS IV SOLN
INTRAVENOUS | Status: DC | PRN
  Administered 2012-05-25 (×2): via INTRAVENOUS

## 2012-05-25 MED ORDER — SODIUM CHLORIDE 0.9 % IV SOLN
1500.0000 mg | INTRAVENOUS | Status: AC
  Administered 2012-05-25: 1500 mg via INTRAVENOUS
  Filled 2012-05-25: qty 1500

## 2012-05-25 MED ORDER — TRIAMTERENE-HCTZ 75-50 MG PO TABS
1.0000 | ORAL_TABLET | Freq: Every day | ORAL | Status: DC
  Filled 2012-05-25: qty 1

## 2012-05-25 MED ORDER — TRAMADOL HCL 50 MG PO TABS
50.0000 mg | ORAL_TABLET | Freq: Four times a day (QID) | ORAL | Status: DC | PRN
  Administered 2012-05-26 (×2): 50 mg via ORAL
  Filled 2012-05-25 (×3): qty 1

## 2012-05-25 MED ORDER — ACETAMINOPHEN 325 MG PO TABS: 650.0000 mg | ORAL_TABLET | Freq: Four times a day (QID) | ORAL | Status: DC | PRN

## 2012-05-25 MED ORDER — SODIUM CHLORIDE 0.9 % IJ SOLN
INTRAMUSCULAR | Status: DC | PRN
  Administered 2012-05-25: 50 mL

## 2012-05-25 MED ORDER — BUPIVACAINE ON-Q PAIN PUMP (FOR ORDER SET NO CHG): INJECTION | Status: DC

## 2012-05-25 MED ORDER — PROPOFOL 10 MG/ML IV EMUL
INTRAVENOUS | Status: DC | PRN
  Administered 2012-05-25: 100 ug/kg/min via INTRAVENOUS

## 2012-05-25 MED ORDER — KETAMINE HCL 10 MG/ML IJ SOLN
INTRAMUSCULAR | Status: DC | PRN
  Administered 2012-05-25 (×2): 10 mg via INTRAVENOUS

## 2012-05-25 MED ORDER — PANTOPRAZOLE SODIUM 40 MG PO TBEC
40.0000 mg | DELAYED_RELEASE_TABLET | Freq: Every day | ORAL | Status: DC
  Filled 2012-05-25 (×2): qty 1

## 2012-05-25 MED ORDER — MENTHOL 3 MG MT LOZG: 1.0000 | LOZENGE | OROMUCOSAL | Status: DC | PRN

## 2012-05-25 MED ORDER — 0.9 % SODIUM CHLORIDE (POUR BTL) OPTIME
TOPICAL | Status: DC | PRN
  Administered 2012-05-25: 1000 mL

## 2012-05-25 MED ORDER — PHENOL 1.4 % MT LIQD: 1.0000 | OROMUCOSAL | Status: DC | PRN

## 2012-05-25 SURGICAL SUPPLY — 59 items
BAG SPEC THK2 15X12 ZIP CLS (MISCELLANEOUS) ×1
BAG ZIPLOCK 12X15 (MISCELLANEOUS) ×2 IMPLANT
BANDAGE ELASTIC 6 VELCRO ST LF (GAUZE/BANDAGES/DRESSINGS) ×2 IMPLANT
BANDAGE ESMARK 6X9 LF (GAUZE/BANDAGES/DRESSINGS) ×1 IMPLANT
BLADE SAG 18X100X1.27 (BLADE) ×2 IMPLANT
BLADE SAW SGTL 11.0X1.19X90.0M (BLADE) ×2 IMPLANT
BNDG CMPR 9X6 STRL LF SNTH (GAUZE/BANDAGES/DRESSINGS) ×1
BNDG ESMARK 6X9 LF (GAUZE/BANDAGES/DRESSINGS) ×2
BOWL SMART MIX CTS (DISPOSABLE) ×1 IMPLANT
CATH KIT ON-Q SILVERSOAK 5 (CATHETERS) ×1 IMPLANT
CATH KIT ON-Q SILVERSOAK 5IN (CATHETERS) IMPLANT
CEMENT HV SMART SET (Cement) ×4 IMPLANT
CLOTH BEACON ORANGE TIMEOUT ST (SAFETY) ×2 IMPLANT
CUFF TOURN SGL QUICK 34 (TOURNIQUET CUFF) ×2
CUFF TRNQT CYL 34X4X40X1 (TOURNIQUET CUFF) ×1 IMPLANT
DRAPE EXTREMITY T 121X128X90 (DRAPE) ×2 IMPLANT
DRAPE POUCH INSTRU U-SHP 10X18 (DRAPES) ×2 IMPLANT
DRAPE U-SHAPE 47X51 STRL (DRAPES) ×2 IMPLANT
DRSG ADAPTIC 3X8 NADH LF (GAUZE/BANDAGES/DRESSINGS) ×2 IMPLANT
DURAPREP 26ML APPLICATOR (WOUND CARE) ×2 IMPLANT
ELECT REM PT RETURN 9FT ADLT (ELECTROSURGICAL) ×2
ELECTRODE REM PT RTRN 9FT ADLT (ELECTROSURGICAL) ×1 IMPLANT
EVACUATOR 1/8 PVC DRAIN (DRAIN) ×2 IMPLANT
FACESHIELD LNG OPTICON STERILE (SAFETY) ×10 IMPLANT
GLOVE BIO SURGEON STRL SZ8 (GLOVE) ×2 IMPLANT
GLOVE BIOGEL PI IND STRL 6.5 (GLOVE) IMPLANT
GLOVE BIOGEL PI IND STRL 8 (GLOVE) ×2 IMPLANT
GLOVE BIOGEL PI INDICATOR 6.5 (GLOVE) ×2
GLOVE BIOGEL PI INDICATOR 8 (GLOVE) ×1
GLOVE ECLIPSE 8.0 STRL XLNG CF (GLOVE) ×1 IMPLANT
GLOVE SURG SS PI 6.5 STRL IVOR (GLOVE) ×6 IMPLANT
GOWN STRL NON-REIN LRG LVL3 (GOWN DISPOSABLE) ×4 IMPLANT
GOWN STRL REIN XL XLG (GOWN DISPOSABLE) ×2 IMPLANT
HANDPIECE INTERPULSE COAX TIP (DISPOSABLE) ×2
IMMOBILIZER KNEE 20 (SOFTGOODS) ×2
IMMOBILIZER KNEE 20 THIGH 36 (SOFTGOODS) ×1 IMPLANT
KIT BASIN OR (CUSTOM PROCEDURE TRAY) ×2 IMPLANT
MANIFOLD NEPTUNE II (INSTRUMENTS) ×2 IMPLANT
NEEDLE HYPO 22GX1.5 SAFETY (NEEDLE) ×1 IMPLANT
NS IRRIG 1000ML POUR BTL (IV SOLUTION) ×2 IMPLANT
PACK TOTAL JOINT (CUSTOM PROCEDURE TRAY) ×2 IMPLANT
PAD ABD 7.5X8 STRL (GAUZE/BANDAGES/DRESSINGS) ×2 IMPLANT
PAD CAST 4YDX4 CTTN HI CHSV (CAST SUPPLIES) IMPLANT
PADDING CAST COTTON 4X4 STRL (CAST SUPPLIES) ×6
PADDING CAST COTTON 6X4 STRL (CAST SUPPLIES) ×3 IMPLANT
POSITIONER SURGICAL ARM (MISCELLANEOUS) ×2 IMPLANT
SET HNDPC FAN SPRY TIP SCT (DISPOSABLE) ×1 IMPLANT
SPONGE GAUZE 4X4 12PLY (GAUZE/BANDAGES/DRESSINGS) ×2 IMPLANT
STRIP CLOSURE SKIN 1/2X4 (GAUZE/BANDAGES/DRESSINGS) ×3 IMPLANT
SUCTION FRAZIER 12FR DISP (SUCTIONS) ×2 IMPLANT
SUT MNCRL AB 4-0 PS2 18 (SUTURE) ×2 IMPLANT
SUT VIC AB 2-0 CT1 27 (SUTURE) ×6
SUT VIC AB 2-0 CT1 TAPERPNT 27 (SUTURE) ×3 IMPLANT
SUT VLOC 180 0 24IN GS25 (SUTURE) ×2 IMPLANT
SYR 20CC LL (SYRINGE) ×1 IMPLANT
TOWEL OR 17X26 10 PK STRL BLUE (TOWEL DISPOSABLE) ×4 IMPLANT
TRAY FOLEY CATH 14FRSI W/METER (CATHETERS) ×2 IMPLANT
WATER STERILE IRR 1500ML POUR (IV SOLUTION) ×3 IMPLANT
WRAP KNEE MAXI GEL POST OP (GAUZE/BANDAGES/DRESSINGS) ×3 IMPLANT

## 2012-05-25 NOTE — Interval H&P Note (Signed)
History and Physical Interval Note:  05/25/2012 6:59 AM  Crystal Ramsey  has presented today for surgery, with the diagnosis of Osteoarthritis of the Right knee  The various methods of treatment have been discussed with the patient and family. After consideration of risks, benefits and other options for treatment, the patient has consented to  Procedure(s) (LRB) with comments: TOTAL KNEE ARTHROPLASTY (Right) as a surgical intervention .  The patient's history has been reviewed, patient examined, no change in status, stable for surgery.  I have reviewed the patient's chart and labs.  Questions were answered to the patient's satisfaction.     Loanne Drilling

## 2012-05-25 NOTE — Anesthesia Preprocedure Evaluation (Signed)
Anesthesia Evaluation  Patient identified by MRN, date of birth, ID band Patient awake    Reviewed: Allergy & Precautions, H&P , NPO status , Patient's Chart, lab work & pertinent test results  Airway Mallampati: II TM Distance: >3 FB Neck ROM: Full    Dental No notable dental hx.    Pulmonary neg pulmonary ROS,  breath sounds clear to auscultation  Pulmonary exam normal       Cardiovascular hypertension, Pt. on medications Rhythm:Regular Rate:Normal     Neuro/Psych negative neurological ROS  negative psych ROS   GI/Hepatic Neg liver ROS, GERD-  Medicated,  Endo/Other  negative endocrine ROS  Renal/GU negative Renal ROS  negative genitourinary   Musculoskeletal negative musculoskeletal ROS (+)   Abdominal   Peds negative pediatric ROS (+)  Hematology negative hematology ROS (+)   Anesthesia Other Findings   Reproductive/Obstetrics negative OB ROS                           Anesthesia Physical Anesthesia Plan  ASA: II  Anesthesia Plan: Spinal   Post-op Pain Management:    Induction:   Airway Management Planned: Simple Face Mask  Additional Equipment:   Intra-op Plan:   Post-operative Plan:   Informed Consent: I have reviewed the patients History and Physical, chart, labs and discussed the procedure including the risks, benefits and alternatives for the proposed anesthesia with the patient or authorized representative who has indicated his/her understanding and acceptance.     Plan Discussed with: CRNA and Surgeon  Anesthesia Plan Comments:         Anesthesia Quick Evaluation

## 2012-05-25 NOTE — Evaluation (Signed)
Physical Therapy Evaluation Patient Details Name: Crystal Ramsey MRN: [id number] DOB: 1935/07/01 Today's Date: 05/25/2012 Time: 0865-7846 PT Time Calculation (min): 26 min  PT Assessment / Plan / Recommendation Clinical Impression  pt is s/p right TKA POD # 0 today and will benefit from continued PT to maximize independence for transition to skilled rehab post acute    PT Assessment  Patient needs continued PT services    Follow Up Recommendations  SNF    Does the patient have the potential to tolerate intense rehabilitation      Barriers to Discharge        Equipment Recommendations  Rolling walker with 5" wheels    Recommendations for Other Services     Frequency 7X/week    Precautions / Restrictions Precautions Precautions: Knee Required Braces or Orthoses: Knee Immobilizer - Right Knee Immobilizer - Right: Discontinue once straight leg raise with < 10 degree lag Restrictions RLE Weight Bearing: Weight bearing as tolerated   Pertinent Vitals/Pain       Mobility  Bed Mobility Bed Mobility: Supine to Sit;Sit to Supine;Sitting - Scoot to Edge of Bed Supine to Sit: 4: Min assist Sitting - Scoot to [place] of Bed: 4: Min assist Sit to Supine: 4: Min assist Details for Bed Mobility Assistance: pt with n/v on EOB; uable to transfer to chair;  Transfers Transfers: Not assessed (due to n/v)    Shoulder Instructions     Exercises Total Joint Exercises Ankle Circles/Pumps: AROM;Both;10 reps Quad Sets: AROM;Right;5 reps   PT Diagnosis: Difficulty walking  PT Problem List: Decreased strength;Decreased range of motion;Decreased activity tolerance;Decreased mobility;Decreased knowledge of use of DME PT Treatment Interventions: DME instruction;Gait training;Functional mobility training;Therapeutic activities;Therapeutic exercise;Patient/family education   PT Goals Acute Rehab PT Goals PT Goal Formulation: With patient Time For Goal Achievement: 06/01/12 Potential to  Achieve Goals: Good Pt will go Supine/Side to Sit: with supervision PT Goal: Supine/Side to Sit - Progress: Goal set today Pt will go Sit to Supine/Side: with supervision PT Goal: Sit to Supine/Side - Progress: Goal set today Pt will go Sit to Stand: with supervision PT Goal: Sit to Stand - Progress: Goal set today Pt will go Stand to Sit: with supervision Pt will Ambulate: 51 - 150 feet;with supervision;with rolling walker PT Goal: Ambulate - Progress: Goal set today Pt will Perform Home Exercise Program: with supervision, verbal cues required/provided PT Goal: Perform Home Exercise Program - Progress: Goal set today  Visit Information  Last PT Received On: 05/25/12 Assistance Needed: +1    Subjective Data  Subjective: i ambetter Patient Stated Goal: to [employer]   Prior Functioning  Home Living Available Help at Discharge: Skilled Nursing Facility Home Adaptive Equipment: None Prior Function Level of Independence: Independent Driving: Yes    Cognition  Overall Cognitive Status: Appears within functional limits for tasks assessed/performed Arousal/Alertness: Awake/alert Orientation Level: Appears intact for tasks assessed Behavior During Session: [facility] for tasks performed    Extremity/Trunk Assessment Right Upper Extremity Assessment RUE ROM/Strength/Tone: [facility] for tasks assessed Left Upper Extremity Assessment LUE ROM/Strength/Tone: [facility] for tasks assessed Right Lower Extremity Assessment RLE ROM/Strength/Tone: Deficits RLE ROM/Strength/Tone Deficits: able to assist with SLR; ankle grossly WFL; knee flexed to ~35 degrees; encouraaged ext and ankle pumps Left Lower Extremity Assessment LLE ROM/Strength/Tone: [facility] for tasks assessed   Balance Static Sitting Balance Static Sitting - Balance Support: No upper extremity supported;Feet supported Static Sitting - Level of Assistance: 5: Stand by assistance  End of Session PT - End of  Session Equipment Utilized During  Treatment: Right knee immobilizer Activity Tolerance: Treatment limited secondary to medical complications (Comment) Patient left: in bed;with call bell/phone within reach;with nursing in room Nurse Communication: Other (comment) (n/v)  GP     [facility] 05/25/2012, 5:12 PM

## 2012-05-25 NOTE — Addendum Note (Signed)
Addendum  created 05/25/12 1610 by Bevelyn Buckles, CRNA   Modules edited:Charges VN

## 2012-05-25 NOTE — Anesthesia Postprocedure Evaluation (Signed)
  Anesthesia Post-op Note  Patient: Crystal Ramsey  Procedure(s) Performed: Procedure(s) (LRB): TOTAL KNEE ARTHROPLASTY (Right)  Patient Location: PACU  Anesthesia Type: Spinal  Level of Consciousness: awake and alert   Airway and Oxygen Therapy: Patient Spontanous Breathing  Post-op Pain: mild  Post-op Assessment: Post-op Vital signs reviewed, Patient's Cardiovascular Status Stable, Respiratory Function Stable, Patent Airway and No signs of Nausea or vomiting  Last Vitals:  Filed Vitals:   05/25/12 0522  BP: 169/73  Pulse: 93  Temp: 36.8 C  Resp: 18    Post-op Vital Signs: stable   Complications: No apparent anesthesia complications

## 2012-05-25 NOTE — Progress Notes (Signed)
Pt became itchy this morning from CHG soap and scratched her Rt knee. Note posted on front of chart for Dr. Lequita Halt to examine in holding room

## 2012-05-25 NOTE — Transfer of Care (Signed)
Immediate Anesthesia Transfer of Care Note  Patient: Crystal Ramsey  Procedure(s) Performed: Procedure(s) (LRB) with comments: TOTAL KNEE ARTHROPLASTY (Right)  Patient Location: PACU  Anesthesia Type:Regional  Level of Consciousness: awake, alert  and oriented  Airway & Oxygen Therapy: Patient Spontanous Breathing and Patient connected to face mask oxygen  Post-op Assessment: Report given to PACU RN and Post -op Vital signs reviewed and stable  Post vital signs: Reviewed and stable  Complications: No apparent anesthesia complications

## 2012-05-25 NOTE — Progress Notes (Signed)
Clinical Social Work Department BRIEF PSYCHOSOCIAL ASSESSMENT 05/25/2012  Patient:  Crystal Ramsey, Crystal Ramsey     Account Number:  [id number]     Admit date:  05/25/2012  Clinical Social Worker: Cori Razor, CLINICAL SOCIAL WORKER  Date/Time:  05/25/2012 04:03 PM  Referred by:  Physician  Date Referred:  05/25/2012 Referred for  SNF Placement   Other Referral:   Interview type:  Patient Other interview type:    PSYCHOSOCIAL DATA Living Status:  HUSBAND Admitted from facility:   Level of care:  Skilled Nursing Facility Primary support name:  Sammantha Mehlhaff Primary support relationship to patient:  SPOUSE Degree of support available:   supportive    CURRENT CONCERNS Current Concerns  Post-Acute Placement   Other Concerns:    SOCIAL WORK ASSESSMENT / PLAN Pt is a 77 yr old female living at home prior to hospitalization. CSW met with pt and spouse to assist with d/c planning. Pt has made prior arrangements to have ST Rehab at [facility] following hospital d/c. CSW contacted SNF and d/c plan has been confirmed. Will follow to assist with d/c planning to SNF.   Assessment/plan status:  Psychosocial Support/Ongoing Assessment of Needs Other assessment/ plan:   Information/referral to community resources:   None needed at this time.    PATIENT'S/FAMILY'S RESPONSE TO PLAN OF CARE: Pt is looking forward to having ST Rehab at [facility].   Cori Razor LCSW [phone]

## 2012-05-25 NOTE — Anesthesia Procedure Notes (Signed)
Spinal  Patient location during procedure: OR End time: 05/25/2012 7:15 AM Staffing CRNA/Resident: Enriqueta Shutter Performed by: anesthesiologist and resident/CRNA  Preanesthetic Checklist Completed: patient identified, site marked, surgical consent, pre-op evaluation, timeout performed, IV checked, risks and benefits discussed and monitors and equipment checked Spinal Block Patient position: sitting Prep: Betadine Patient monitoring: heart rate, continuous pulse ox and blood pressure Approach: midline Location: L3-4 Injection technique: single-shot Needle Needle type: Sprotte  Needle gauge: 24 G Needle length: 9 cm Assessment Sensory level: T6 Additional Notes Expiration date of kit checked and confirmed. Patient tolerated procedure well, without complications.

## 2012-05-25 NOTE — H&P (View-Only) (Signed)
Crystal Ramsey  DOB: 1936-01-16 Married / Language: English / Race: White Female  Date of Admission:  05/25/2012  Chief Complaint:  Right Knee Pain  History of Present Illness The patient is a 77 year old female who comes in for a preoperative History and Physical. The patient is scheduled for a right total knee arthroplasty to be performed by Dr. Gus Rankin. Aluisio, MD at [facility] on 05/25/2012. The patient is a 77 year old female who presents for follow up of their knee. The patient is being followed for their right osteoarthritis. They are now 4 month(s) out from last injection. Symptoms reported today include: pain. The patient feels that they are doing poorly and report their pain level to be mild to moderate. The patient has reported improvement of their symptoms with: conservative measures and Cortisone injections. Note for "Follow-up Knee": She is scheduled for knee replacement by Dr. Lequita Halt. She states that she's had multiple injections in the past with Dr. Penni Bombard. She's had an injection every couple of months. She said the knee is not having a tremendous amount of pain but is leading to significant dysfunction. Prior to the previous cortisone injections, she did have more pain. She's at a stage now where the knee is definitely limiting what she can and can't do. She is ready to get it fixed. They have been treated conservatively in the past for the above stated problem and despite conservative measures, they continue to have progressive pain and severe functional limitations and dysfunction. They have failed non-operative management including home exercise, medications, and injections. It is felt that they would benefit from undergoing total joint replacement. Risks and benefits of the procedure have been discussed with the patient and they elect to proceed with surgery. There are no active contraindications to surgery such as ongoing infection or rapidly  progressive neurological disease.   Problem List Osteoarthrosis NOS, lower leg (715.96). 07/13/1987   Allergies Dyes. eye exam dyes Demerol *ANALGESICS - OPIOID* Sulfa Drugs Zithromax *MACROLIDES* Dilaudid *ANALGESICS - OPIOID* Penicillins   Family History Osteoporosis. sister Cancer. mother, sister and brother Osteoarthritis. father, sister, brother and grandmother mothers side Hypertension. father and sister   Social History Living situation. live with spouse Illicit drug use. no Previously in rehab. no Pain Contract. no Exercise. Exercises rarely Children. 1 Alcohol use. current drinker; drinks wine; 8-14 per week Drug/Alcohol Rehab (Currently). no Current work status. retired Tobacco / smoke exposure. yes outdoors only Number of flights of stairs before winded. less than 1 Marital status. married Tobacco use. former smoker; smoke(d) 3 or more pack(s) per day Post-Surgical Plans. Plan is to look into skilled rehab facility. Advance Directives. Living Will, Healthcare POA   Medication History Amiloride-Hydrochlorothiazide (5-50MG  Tablet, Oral) Active. Aspirin Buffered (325MG  Tablet, Oral) Active. PriLOSEC (20MG  Capsule DR, Oral) Active. Benadryl Allergy (12.5MG /5ML Liquid, Oral) Active. Potassium (99MG  Tablet, Oral) Active. Vitamin D (1000UNIT Capsule, Oral) Active. Multivital ( Oral) Active. Senna ( Oral) Active. Tums ( Oral) Specific dose unknown - Active. Patanol (0.1% Solution, Ophthalmic) Active.   Pregnancy / Birth History Pregnant. no   Past Surgical History Breast Biopsy. right Dilation and Curettage of Uterus Cataract Surgery. left Hysterectomy. partial (non-cancerous) Tonsillectomy   Medical History Osteoarthritis Chronic Pain Autoimmune disorder Gastroesophageal Reflux Disease High blood pressure Fibromyalgia Depression Impaired Hearing Cataract Varicose veins Menopause Mumps   Review of  Systems General:Not Present- Chills, Fever, Night Sweats, Fatigue, Weight Gain, Weight Loss and Memory Loss. Skin:Not Present- Hives, Itching, Rash, Eczema and  Lesions. HEENT:Present- Hearing Loss. Not Present- Tinnitus, Headache, Double Vision, Visual Loss and Dentures. Respiratory:Present- Shortness of breath with exertion. Not Present- Shortness of breath at rest, Allergies, Coughing up blood and Chronic Cough. Cardiovascular:Not Present- Chest Pain, Racing/skipping heartbeats, Difficulty Breathing Lying Down, Murmur, Swelling and Palpitations. Gastrointestinal:Present- Heartburn and Constipation. Not Present- Bloody Stool, Abdominal Pain, Vomiting, Nausea, Diarrhea, Difficulty Swallowing, Jaundice and Loss of appetitie. Female Genitourinary:Not Present- Blood in Urine, Urinary frequency, Weak urinary stream, Discharge, Flank Pain, Incontinence, Painful Urination, Urgency, Urinary Retention and Urinating at Night. Musculoskeletal:Present- Muscle Pain, Joint Swelling, Joint Pain, Back Pain and Morning Stiffness. Not Present- Muscle Weakness and Spasms. Neurological:Not Present- Tremor, Dizziness, Blackout spells, Paralysis, Difficulty with balance and Weakness. Psychiatric:Not Present- Insomnia.   Vitals Weight: 165 lb Height: 62.5 in Body Surface Area: 1.82 m Body Mass Index: 29.7 kg/m Pulse: 72 (Regular) Resp.: 12 (Unlabored) BP: 182/88 (Sitting, Left Arm, Standard)    Physical Exam The physical exam findings are as follows:  Note: Patient is a 77 year old female with continued knee pain.   General Mental Status - Alert, cooperative and good historian. General Appearance- pleasant. Not in acute distress. Orientation- Oriented X3. Build & Nutrition- Well nourished and Well developed.   Head and Neck Head- normocephalic, atraumatic . Neck Global Assessment- supple. no bruit auscultated on the right and no bruit auscultated on the  left.   Eye Pupil- Bilateral- Regular and Round. Note: wears glasses Motion- Bilateral- EOMI.   Chest and Lung Exam Auscultation: Breath sounds:- clear at anterior chest wall and - clear at posterior chest wall. Adventitious sounds:- No Adventitious sounds.   Cardiovascular Auscultation:Rhythm- Regular rate and rhythm. Heart Sounds- S1 WNL and S2 WNL. Murmurs & Other Heart Sounds:Auscultation of the heart reveals - No Murmurs.   Abdomen Palpation/Percussion:Tenderness- Abdomen is non-tender to palpation. Rigidity (guarding)- Abdomen is soft. Auscultation:Auscultation of the abdomen reveals - Bowel sounds normal.   Female Genitourinary Not done, not pertinent to present illness  Musculoskeletal On exam, well-developed female, alert and oriented, in no apparent distress. Both hips have normal ROM with no discomfort. Her left knee has no effusion. Range is about 0-135 but no swelling, tenderness or instability.  Right knee: No effusion. Range is 10-120. Marked crepitus on ROM. Varus deformity. Tender medial greater than lateral with no instability.  Pulses, sensation and motor are intact, both lower extremities.  RADIOGRAPHS: Radiographs are reviewed from last December, AP of both knees and lateral of the right, showing severe end stage arthritis in the medial and patellofemoral compartments of the right knee.  Assessment & Plan Osteoarthrosis NOS, lower leg (715.96) Impression: Left Knee  Note: Plan is for a Right Total Knee Repalcement by Dr. Lequita Halt.  Plan is to look into Rehab.  PCP - Dr. Kirby Funk - Patient has been seen preoperatively and felt to be stable for surgery.  Signed electronically by Roberts Gaudy, PA-C

## 2012-05-25 NOTE — Op Note (Addendum)
Pre-operative diagnosis- Osteoarthritis  Right knee(s)  Post-operative diagnosis- Osteoarthritis Right knee(s)  Procedure-  Right  Total Knee Arthroplasty  Surgeon- Gus Rankin. Suleika Donavan, MD  Assistant- Dimitri Ped, PA-C   Anesthesia-  Spinal EBL-* No blood loss amount entered *  Drains Hemovac  Tourniquet time-  Total Tourniquet Time Documented: Thigh (Right) - 35 minutes   Complications- None  Condition-PACU - hemodynamically stable.   Brief Clinical Note  Crystal Ramsey is a 77 y.o. year old female with end stage OA of her right knee with progressively worsening pain and dysfunction. She has constant pain, with activity and at rest and significant functional deficits with difficulties even with ADLs. She has had extensive non-op management including analgesics, injections of cortisone, and home exercise program, but remains in significant pain with significant dysfunction.Radiographs show bone on bone arthritis medial and patellofemoral with large varus deformity. She presents now for right Total Knee Arthroplasty.    Procedure in detail---   The patient is brought into the operating room and positioned supine on the operating table. After successful administration of  Spinal,   a tourniquet is placed high on the  Right thigh(s) and the lower extremity is prepped and draped in the usual sterile fashion. Time out is performed by the operating team and then the  Right lower extremity is wrapped in Esmarch, knee flexed and the tourniquet inflated to 300 mmHg.       A midline incision is made with a ten blade through the subcutaneous tissue to the level of the extensor mechanism. A fresh blade is used to make a medial parapatellar arthrotomy. Soft tissue over the proximal medial tibia is subperiosteally elevated to the joint line with a knife and into the semimembranosus bursa with a Cobb elevator. Soft tissue over the proximal lateral tibia is elevated with attention being paid to  avoiding the patellar tendon on the tibial tubercle. The patella is everted, knee flexed 90 degrees and the ACL and PCL are removed. Findings are bone on bone medial and patellofemoral with large global osteophytes.        The drill is used to create a starting hole in the distal femur and the canal is thoroughly irrigated with sterile saline to remove the fatty contents. The 5 degree Right  valgus alignment guide is placed into the femoral canal and the distal femoral cutting block is pinned to remove 10 mm off the distal femur. Resection is made with an oscillating saw.      The tibia is subluxed forward and the menisci are removed. The extramedullary alignment guide is placed referencing proximally at the medial aspect of the tibial tubercle and distally along the second metatarsal axis and tibial crest. The block is pinned to remove 2mm off the more deficient medial  side. Resection is made with an oscillating saw. Size 2.5is the most appropriate size for the tibia and the proximal tibia is prepared with the modular drill and keel punch for that size.      The femoral sizing guide is placed and size 3 is most appropriate. Rotation is marked off the epicondylar axis and confirmed by creating a rectangular flexion gap at 90 degrees. The size 3 cutting block is pinned in this rotation and the anterior, posterior and chamfer cuts are made with the oscillating saw. The intercondylar block is then placed and that cut is made.      Trial size 2.5 tibial component, trial size 3 posterior stabilized femur and a 12.5  mm posterior stabilized rotating platform insert trial is placed. Full extension is achieved with excellent varus/valgus and anterior/posterior balance throughout full range of motion. The patella is everted and thickness measured to be 22  mm. Free hand resection is taken to 12 mm, a 35 template is placed, lug holes are drilled, trial patella is placed, and it tracks normally. Osteophytes are removed  off the posterior femur with the trial in place. All trials are removed and the cut bone surfaces prepared with pulsatile lavage. Cement is mixed and once ready for implantation, the size 2.5 tibial implant, size  3 posterior stabilized femoral component, and the size 35 patella are cemented in place and the patella is held with the clamp. The trial insert is placed and the knee held in full extension.The Exparel (20 ml mixed with 50 ml saline) is injected into the extensor mechanism, posterior capsule, medial and lateral gutters and subcutaneous tissues All extruded cement is removed and once the cement is hard the permanent 12.5 mm posterior stabilized rotating platform insert is placed into the tibial tray.      The wound is copiously irrigated with saline solution and the extensor mechanism closed over a hemovac drain with #1 PDS suture. The tourniquet is released for a total tourniquet time of 34  minutes. Flexion against gravity is 140 degrees and the patella tracks normally. Subcutaneous tissue is closed with 2.0 vicryl and subcuticular with running 4.0 Monocryl. The incision is cleaned and dried and steri-strips and a bulky sterile dressing are applied. The limb is placed into a knee immobilizer and the patient is awakened and transported to recovery in stable condition.      Please note that a surgical assistant was a medical necessity for this procedure in order to perform it in a safe and expeditious manner. Surgical assistant was necessary to retract the ligaments and vital neurovascular structures to prevent injury to them and also necessary for proper positioning of the limb to allow for anatomic placement of the prosthesis.   Gus Rankin Crystal Postell, MD    05/25/2012, 8:11 AM

## 2012-05-26 ENCOUNTER — Encounter (HOSPITAL_COMMUNITY): Payer: Self-pay | Admitting: Orthopedic Surgery

## 2012-05-26 DIAGNOSIS — E871 Hypo-osmolality and hyponatremia: Secondary | ICD-10-CM

## 2012-05-26 DIAGNOSIS — E876 Hypokalemia: Secondary | ICD-10-CM

## 2012-05-26 LAB — BASIC METABOLIC PANEL
BUN: 11 mg/dL (ref 6–23)
Calcium: 8.3 mg/dL — ABNORMAL LOW (ref 8.4–10.5)
GFR calc Af Amer: >90 mL/min (ref >90)
GFR calc non Af Amer: 89 mL/min — ABNORMAL LOW (ref >90)
Potassium: 3.2 mEq/L — ABNORMAL LOW (ref 3.5–5.1)

## 2012-05-26 LAB — CBC
HCT: 29.4 % — ABNORMAL LOW (ref 36.0–46.0)
MCH: 31.4 pg (ref 26.0–34.0)
MCHC: 33.7 g/dL (ref 30.0–36.0)
RDW: 13.3 % (ref 11.5–15.5)

## 2012-05-26 MED ORDER — OMEPRAZOLE 20 MG PO CPDR
20.0000 mg | DELAYED_RELEASE_CAPSULE | Freq: Every day | ORAL | Status: DC
  Administered 2012-05-27: 20 mg via ORAL
  Filled 2012-05-26 (×3): qty 1

## 2012-05-26 MED ORDER — NON FORMULARY: 20.0000 mg | Freq: Every day | Status: DC

## 2012-05-26 MED ORDER — HYDROMORPHONE HCL 2 MG PO TABS: 2.0000 mg | ORAL_TABLET | ORAL | Status: DC | PRN

## 2012-05-26 MED ORDER — HYDROCODONE-ACETAMINOPHEN 5-325 MG PO TABS
1.0000 | ORAL_TABLET | ORAL | Status: DC | PRN
  Administered 2012-05-26 – 2012-05-28 (×10): 1 via ORAL
  Filled 2012-05-26: qty 1
  Filled 2012-05-26: qty 2
  Filled 2012-05-26 (×7): qty 1
  Filled 2012-05-26: qty 2

## 2012-05-26 MED ORDER — CLONIDINE HCL 0.1 MG PO TABS
0.1000 mg | ORAL_TABLET | Freq: Once | ORAL | Status: AC
  Administered 2012-05-26: 0.1 mg via ORAL
  Filled 2012-05-26 (×2): qty 1

## 2012-05-26 NOTE — Progress Notes (Signed)
Clinical Social Work Department CLINICAL SOCIAL WORK PLACEMENT NOTE 05/26/2012  Patient:  Crystal Ramsey, Crystal Ramsey  Account Number:  [id number] Admit date:  05/25/2012  Clinical Social Worker:  Cori Razor, LCSW  Date/time:  05/25/2012 04:11 PM  Clinical Social Work is seeking post-discharge placement for this patient at the following level of care:   SKILLED NURSING   (*CSW will update this form in Epic as items are completed)   05/25/2012  Patient/family provided with [facility] Department of Clinical Social Work's list of facilities offering this level of care within the geographic area requested by the patient (or if unable, by the patient's family).    Patient/family informed of their freedom to choose among providers that offer the needed level of care, that participate in Medicare, Medicaid or managed care program needed by the patient, have an available bed and are willing to accept the patient.    Patient/family informed of [facility]' ownership interest in [facility], as well as of the fact that they are under no obligation to receive care at this facility.  PASARR submitted to EDS on 05/26/2012 PASARR number received from EDS on   FL2 transmitted to all facilities in geographic area requested by pt/family on  05/26/2012 FL2 transmitted to all facilities within larger geographic area on   Patient informed that his/her managed care company has contracts with or will negotiate with  certain facilities, including the following:     Patient/family informed of bed offers received:  05/26/2012 Patient [facility] bed [facility] Physician recommends and patient chooses bed at    Patient to be transferred to [facility] on   Patient to be transferred to facility by   The following physician request were entered in Epic:   Additional Comments:  Cori Razor LCSW [phone]

## 2012-05-26 NOTE — Progress Notes (Signed)
Physical Therapy Treatment Patient Details Name: Crystal Ramsey MRN: [id number] DOB: May 28, 1935 Today's Date: 05/26/2012 Time: 4098-1191 PT Time Calculation (min): 12 min  PT Assessment / Plan / Recommendation Comments on Treatment Session  POD # 1 pm session R TKR.  Pt anxious to get back into bed "I have been sitting up for 3 hours". Applied KI and assisted back to bed.  Pt stated she was unable to tolerate any further sctivity and requested to rest. Pt plans to D/C to [facility] for ST Rehab.    Follow Up Recommendations  SNF     Does the patient have the potential to tolerate intense rehabilitation     Barriers to Discharge        Equipment Recommendations  Rolling walker with 5" wheels    Recommendations for Other Services    Frequency 7X/week   Plan Discharge plan remains appropriate    Precautions / Restrictions Precautions Precautions: Knee Precaution Comments: Instructed pt on KI use for amb Required Braces or Orthoses: Knee Immobilizer - Right Knee Immobilizer - Right: Discontinue once straight leg raise with < 10 degree lag Restrictions Weight Bearing Restrictions: No RLE Weight Bearing: Weight bearing as tolerated   Pertinent Vitals/Pain C/o max faitigue   Mobility  Bed Mobility Bed Mobility: Sit to Supine Supine to Sit: 4: Min assist Sit to Supine: 4: Min assist;3: Mod assist Details for Bed Mobility Assistance: increased time to position to comfort Transfers Transfers: Sit to Stand;Stand to Sit Sit to Stand: From chair/3-in-1;3: Mod assist Stand to Sit: 4: Min assist;3: Mod assist;To bed Details for Transfer Assistance: 50% VC's on safety and proper tech esp to complete turn prior to sit. Ambulation/Gait Ambulation/Gait Assistance: 4: Min assist Ambulation Distance (Feet): 2 Feet Assistive device: Rolling walker Ambulation/Gait Assistance Details: 50% VC's on proper sequencing and increased time  Pt c/o 8/10 R knee pain during stance. Gait Pattern:  Step-to pattern;Decreased stance time - right Gait velocity: decreased    Exercises Total Joint Exercises Ankle Circles/Pumps: AROM;Both;10 reps;Supine Quad Sets: AROM;Both;10 reps;Supine Gluteal Sets: AROM;Both;10 reps;Supine Towel Squeeze: AROM;Both;10 reps;Supine Heel Slides: AAROM;Right;10 reps;Supine Hip ABduction/ADduction: AAROM;Right;10 reps;Supine Straight Leg Raises: AAROM;Right;10 reps;Supine   PT Goals                                               Progressing slowly    Visit Information  Last PT Received On: 05/26/12 Assistance Needed: +1    Subjective Data  Subjective: I need to go back to bed Patient Stated Goal: n/a   Cognition       Balance     End of Session PT - End of Session Equipment Utilized During Treatment: Gait belt;Right knee immobilizer Activity Tolerance: Patient limited by fatigue Patient left: in bed;with call bell/phone within reach   Felecia Shelling  PTA [facility]  Acute  Rehab Pager     [phone]

## 2012-05-26 NOTE — Progress Notes (Signed)
   Subjective: 1 Day Post-Op Procedure(s) (LRB): TOTAL KNEE ARTHROPLASTY (Right) Patient reports pain as mild.   Patient seen in rounds with Dr. Lequita Halt. Patient is well, and has had no acute complaints or problems We will start therapy today.  Plan is to go Skilled nursing facility after hospital stay.  Objective: Vital signs in last 24 hours: Temp:  [94.6 F (34.8 C)-98.7 F (37.1 C)] 98.7 F (37.1 C) (01/14 0118) Pulse Rate:  [48-88] 75  (01/14 0118) Resp:  [8-17] 15  (01/14 0400) BP: (115-161)/(60-86) 156/72 mmHg (01/14 0118) SpO2:  [95 %-100 %] 95 % (01/14 0400) Weight:  [75.751 kg (167 lb)] 75.751 kg (167 lb) (01/13 0958)  Intake/Output from previous day:  Intake/Output Summary (Last 24 hours) at 05/26/12 0709 Last data filed at 05/26/12 0600  Gross per 24 hour  Intake   3821 ml  Output   2260 ml  Net   1561 ml    Intake/Output this shift: UOP 400 since MN +1411  Labs:  [facility] 05/26/12 0444  HGB 9.9*    Basename 05/26/12 0444  WBC 5.6  RBC 3.15*  HCT 29.4*  PLT 202    Basename 05/26/12 0444  NA 133*  K 3.2*  CL 97  CO2 29  BUN 11  CREATININE 0.58  GLUCOSE 133*  CALCIUM 8.3*   No results found for this basename: LABPT:2,INR:2 in the last 72 hours  EXAM General - Patient is Alert, Appropriate and Oriented Extremity - Neurovascular intact Sensation intact distally Dorsiflexion/Plantar flexion intact Dressing - dressing C/D/I Motor Function - intact, moving foot and toes well on exam.  Hemovac pulled without difficulty.  Past Medical History  Diagnosis Date  . Arthritis   . DUB (dysfunctional uterine bleeding)   . Ovarian cyst   . Hypertension   . Lichen plano-pilaris   . Complication of anesthesia   . PONV (postoperative nausea and vomiting)   . Fibromyalgia     PT THINKS SHE HAS FIBROMYALGIA  . Rash     RECURRENT  . GERD (gastroesophageal reflux disease)   . Constipation, chronic     Assessment/Plan: 1 Day Post-Op  Procedure(s) (LRB): TOTAL KNEE ARTHROPLASTY (Right) Principal Problem:  *OA (osteoarthritis) of knee Active Problems:  Postop Acute blood loss anemia  Postop Hyponatremia  Postop Hypokalemia  Estimated Body mass index is 30.06 kg/(m^2) as calculated from the following:   Height as of this encounter: 5' 2.5"(1.588 m).   Weight as of this encounter: 167 lb(75.751 kg). Advance diet Up with therapy Discharge to SNF - [facility]  DVT Prophylaxis - Xarelto, ASA 325 mg on hold Weight-Bearing as tolerated to right leg No vaccines. D/C O2 and Pulse OX and try on Room Air  PERKINS, ALEXZANDREW 05/26/2012, 7:09 AM

## 2012-05-26 NOTE — Care Management Note (Signed)
    Page 1 of 1   05/26/2012     2:27:59 PM   CARE MANAGEMENT NOTE 05/26/2012  Patient:  Crystal Ramsey, Crystal Ramsey   Account Number:  [id number]  Date Initiated:  05/26/2012  Documentation initiated by:  Colleen Can  Subjective/Objective Assessment:   DX OSTEOARTHRITIS RIGHT KNEE; TOTAL KNEE REPLACEMNT ON DAY OF ADMISSION     Action/Plan:   CURRENT PLANS ARE FOR SNF REHAB   Anticipated DC Date:  05/28/2012   Anticipated DC Plan:  SKILLED NURSING FACILITY  In-house referral  Clinical Social Worker      DC Planning Services  CM consult      Choice offered to / List presented to:             Status of service:  Completed, signed off Medicare Important Message given?  NA - LOS <3 / Initial given by admissions (If response is "NO", the following Medicare IM given date fields will be blank) Date Medicare IM given:   Date Additional Medicare IM given:    Discharge Disposition:    Per UR Regulation:    If discussed at Long Length of Stay Meetings, dates discussed:    Comments:

## 2012-05-26 NOTE — Progress Notes (Signed)
OT Screen  Patient Details Name: Crystal Ramsey MRN: [id number] DOB: Sep 06, 1935   Cancelled Treatment:    Reason Eval/Treat Not Completed: Other (comment) (screen:  will defer OT to SNF)  [facility], OTR/L [phone] 1/14/20141/14/2014, 2:02 PM

## 2012-05-26 NOTE — Progress Notes (Signed)
Physical Therapy Treatment Patient Details Name: Crystal Ramsey MRN: [id number] DOB: 01-02-1936 Today's Date: 05/26/2012 Time: 1308-6578 PT Time Calculation (min): 39 min  PT Assessment / Plan / Recommendation Comments on Treatment Session  POD # 1 am session R TKR.  Pt required increased time and max encouragement to get OOB as she demon mild anxiety/fear about getting nausea.  Pt stated she did vomit this am. Pt only agreed to get in to recliner "for now", then performed TKR TE's. Pt plans to D/C to [facility] for [facility] Rehab.    Follow Up Recommendations  SNF     Does the patient have the potential to tolerate intense rehabilitation     Barriers to Discharge        Equipment Recommendations  Rolling walker with 5" wheels    Recommendations for Other Services    Frequency 7X/week   Plan Discharge plan remains appropriate    Precautions / Restrictions Precautions Precautions: Knee Precaution Comments: Instructed pt on KI use for amb Required Braces or Orthoses: Knee Immobilizer - Right Knee Immobilizer - Right: Discontinue once straight leg raise with < 10 degree lag Restrictions Weight Bearing Restrictions: No RLE Weight Bearing: Weight bearing as tolerated   Pertinent Vitals/Pain C/o 3/10 R knee pain with act ICE applied    Mobility  Bed Mobility Bed Mobility: Supine to Sit Supine to Sit: 4: Min assist Details for Bed Mobility Assistance: min assist to support R LE off bed and increased time Transfers Transfers: Sit to Stand;Stand to Sit Sit to Stand: 4: Min assist;3: Mod assist;From bed;From elevated surface Stand to Sit: 4: Min assist;To chair/3-in-1 Details for Transfer Assistance: increased time due to fear of nausea and 25% VC's on proper hand placement Ambulation/Gait Ambulation/Gait Assistance: 4: Min assist Ambulation Distance (Feet): 2 Feet Assistive device: Rolling walker Ambulation/Gait Assistance Details: 50% VC's on proper sequencing and increased  time with no c/o nasea but max fear of nausea.  Pt only agreed to amb to recliner this am. Gait Pattern: Step-to pattern;Decreased stride length;Trunk flexed Gait velocity: decreased    Exercises Total Joint Exercises Ankle Circles/Pumps: AROM;Both;10 reps;Supine Quad Sets: AROM;Both;10 reps;Supine Gluteal Sets: AROM;Both;10 reps;Supine Towel Squeeze: AROM;Both;10 reps;Supine Heel Slides: AAROM;Right;10 reps;Supine Hip ABduction/ADduction: AAROM;Right;10 reps;Supine Straight Leg Raises: AAROM;Right;10 reps;Supine   PT Goals                                        progressing    Visit Information  Last PT Received On: 05/26/12 Assistance Needed: +1    Subjective Data  Subjective: I won't be able to walk Patient Stated Goal: n/a   Cognition       Balance     End of Session PT - End of Session Equipment Utilized During Treatment: Gait belt;Right knee immobilizer Activity Tolerance: Patient limited by fatigue Patient left: in chair;with call bell/phone within reach (ICE to R knee)   Felecia Shelling  PTA [facility]  Acute  Rehab Pager     [phone]

## 2012-05-27 ENCOUNTER — Ambulatory Visit (HOSPITAL_COMMUNITY): Payer: Medicare Other

## 2012-05-27 DIAGNOSIS — J984 Other disorders of lung: Secondary | ICD-10-CM | POA: Diagnosis not present

## 2012-05-27 LAB — CBC
Hemoglobin: 9.7 g/dL — ABNORMAL LOW (ref 12.0–15.0)
MCH: 31.9 pg (ref 26.0–34.0)
MCV: 92.4 fL (ref 78.0–100.0)
RBC: 3.04 MIL/uL — ABNORMAL LOW (ref 3.87–5.11)
WBC: 8.3 10*3/uL (ref 4.0–10.5)

## 2012-05-27 LAB — BASIC METABOLIC PANEL
CO2: 28 mEq/L (ref 19–32)
Calcium: 8.9 mg/dL (ref 8.4–10.5)
Chloride: 96 mEq/L (ref 96–112)
Glucose, Bld: 112 mg/dL — ABNORMAL HIGH (ref 70–99)
Sodium: 132 mEq/L — ABNORMAL LOW (ref 135–145)

## 2012-05-27 MED ORDER — POTASSIUM CHLORIDE CRYS ER 20 MEQ PO TBCR
40.0000 meq | EXTENDED_RELEASE_TABLET | Freq: Three times a day (TID) | ORAL | Status: AC
  Administered 2012-05-27 (×3): 40 meq via ORAL
  Filled 2012-05-27 (×3): qty 2

## 2012-05-27 NOTE — Progress Notes (Signed)
   Subjective: 2 Days Post-Op Procedure(s) (LRB): TOTAL KNEE ARTHROPLASTY (Right) Patient reports pain as mild.   Patient seen in rounds by Dr. Lequita Halt. Patient is well, and has had no acute complaints or problems Plan is to go Skilled nursing facility after hospital stay.  Objective: Vital signs in last 24 hours: Temp:  [98 F (36.7 C)-98.9 F (37.2 C)] 98.6 F (37 C) (01/15 1300) Pulse Rate:  [69-89] 89  (01/15 1300) Resp:  [16] 16  (01/15 1300) BP: (122-192)/(67-88) 132/67 mmHg (01/15 1300) SpO2:  [95 %-96 %] 96 % (01/15 1300)  Intake/Output from previous day:  Intake/Output Summary (Last 24 hours) at 05/27/12 1332 Last data filed at 05/27/12 1300  Gross per 24 hour  Intake 1046.08 ml  Output   3000 ml  Net -1953.92 ml    Intake/Output this shift: Total I/O In: 480 [P.O.:480] Out: 1500 [Urine:1500]  Labs:  [facility] 05/27/12 0420 05/26/12 0444  HGB 9.7* 9.9*    Basename 05/27/12 0420 05/26/12 0444  WBC 8.3 5.6  RBC 3.04* 3.15*  HCT 28.1* 29.4*  PLT 191 202    Basename 05/27/12 0420 05/26/12 0444  NA 132* 133*  K 3.0* 3.2*  CL 96 97  CO2 28 29  BUN 8 11  CREATININE 0.52 0.58  GLUCOSE 112* 133*  CALCIUM 8.9 8.3*   No results found for this basename: LABPT:2,INR:2 in the last 72 hours  EXAM General - Patient is Alert, Appropriate and Oriented Extremity - Neurovascular intact Sensation intact distally Dorsiflexion/Plantar flexion intact No cellulitis present Dressing/Incision - clean, dry, no drainage, healing Motor Function - intact, moving foot and toes well on exam.   Past Medical History  Diagnosis Date  . Arthritis   . DUB (dysfunctional uterine bleeding)   . Ovarian cyst   . Hypertension   . Lichen plano-pilaris   . Complication of anesthesia   . PONV (postoperative nausea and vomiting)   . Fibromyalgia     PT THINKS SHE HAS FIBROMYALGIA  . Rash     RECURRENT  . GERD (gastroesophageal reflux disease)   . Constipation, chronic      Assessment/Plan: 2 Days Post-Op Procedure(s) (LRB): TOTAL KNEE ARTHROPLASTY (Right) Principal Problem:  *OA (osteoarthritis) of knee Active Problems:  Postop Acute blood loss anemia  Postop Hyponatremia  Postop Hypokalemia  Estimated Body mass index is 30.06 kg/(m^2) as calculated from the following:   Height as of this encounter: 5' 2.5"(1.588 m).   Weight as of this encounter: 167 lb(75.751 kg). Up with therapy Plan for discharge tomorrow Discharge to SNF  DVT Prophylaxis - Xarelto, ASA 325 mg on hold Weight-Bearing as tolerated to right leg  PERKINS, ALEXZANDREW 05/27/2012, 1:32 PM

## 2012-05-27 NOTE — Progress Notes (Signed)
Physical Therapy Treatment Patient Details Name: Crystal Ramsey MRN: [id number] DOB: 02-02-1936 Today's Date: 05/27/2012 Time: 1478-2956 PT Time Calculation (min): 38 min  PT Assessment / Plan / Recommendation Comments on Treatment Session  POD # 2 am session R TKR.  Pt states she slept better.  Assisted pt OOB to amb in hallway then performed TKR TE's and applied ICE.    Follow Up Recommendations  SNF     Does the patient have the potential to tolerate intense rehabilitation     Barriers to Discharge        Equipment Recommendations  Rolling walker with 5" wheels    Recommendations for Other Services    Frequency 7X/week   Plan Discharge plan remains appropriate    Precautions / Restrictions Precautions Precautions: Knee Precaution Comments: Instructed pt on KI use for amb and when to D/C Required Braces or Orthoses: Knee Immobilizer - Right Knee Immobilizer - Right: Discontinue once straight leg raise with < 10 degree lag Restrictions Weight Bearing Restrictions: No RLE Weight Bearing: Weight bearing as tolerated   Pertinent Vitals/Pain C/o 4/10 R knee pain during gait Pre medicated ICE applied    Mobility  Bed Mobility Bed Mobility: Supine to Sit Supine to Sit: 4: Min guard Details for Bed Mobility Assistance: Min Guard assist to support R LE off bed and increased time Transfers Transfers: Sit to Stand;Stand to [employer] to Stand: 4: Min assist;From bed Stand to Sit: 4: Min guard;To chair/3-in-1 Details for Transfer Assistance: 50% VC's on safety and proper tech esp to complete turn prior to sit.  Ambulation/Gait Ambulation/Gait Assistance: 4: Min guard Ambulation Distance (Feet): 26 Feet Assistive device: Rolling walker Ambulation/Gait Assistance Details: 25% VC's on proper sequencing and increased time.  VC's to increase heel strike. Gait Pattern: Step-to pattern;Decreased stance time - right Gait velocity: decreased    Exercises Total Joint  Exercises Ankle Circles/Pumps: AROM;Both;10 reps;Supine Quad Sets: AROM;Both;10 reps;Supine Gluteal Sets: AROM;Both;10 reps;Supine Towel Squeeze: AROM;Both;10 reps;Supine Heel Slides: AAROM;Right;10 reps;Supine Hip ABduction/ADduction: AAROM;Right;10 reps;Supine Straight Leg Raises: AAROM;Right;10 reps;Supine    PT Goals                                progressing    Visit Information  Last PT Received On: 05/27/12 Assistance Needed: +1    Subjective Data      Cognition       Balance     End of Session PT - End of Session Equipment Utilized During Treatment: Gait belt;Right knee immobilizer Activity Tolerance: Patient tolerated treatment well Patient left: in chair;with call bell/phone within reach (ICE to R knee) CPM Right Knee CPM Right Knee: Off   Felecia Shelling  PTA [facility]  Acute  Rehab Pager     [phone]

## 2012-05-28 DIAGNOSIS — M6281 Muscle weakness (generalized): Secondary | ICD-10-CM | POA: Diagnosis not present

## 2012-05-28 DIAGNOSIS — D62 Acute posthemorrhagic anemia: Secondary | ICD-10-CM | POA: Diagnosis not present

## 2012-05-28 DIAGNOSIS — M25569 Pain in unspecified knee: Secondary | ICD-10-CM | POA: Diagnosis not present

## 2012-05-28 DIAGNOSIS — E876 Hypokalemia: Secondary | ICD-10-CM | POA: Diagnosis not present

## 2012-05-28 DIAGNOSIS — I10 Essential (primary) hypertension: Secondary | ICD-10-CM | POA: Diagnosis not present

## 2012-05-28 DIAGNOSIS — M1990 Unspecified osteoarthritis, unspecified site: Secondary | ICD-10-CM | POA: Diagnosis not present

## 2012-05-28 DIAGNOSIS — Z96659 Presence of unspecified artificial knee joint: Secondary | ICD-10-CM | POA: Diagnosis not present

## 2012-05-28 DIAGNOSIS — L439 Lichen planus, unspecified: Secondary | ICD-10-CM | POA: Diagnosis not present

## 2012-05-28 DIAGNOSIS — Z471 Aftercare following joint replacement surgery: Secondary | ICD-10-CM | POA: Diagnosis not present

## 2012-05-28 DIAGNOSIS — R269 Unspecified abnormalities of gait and mobility: Secondary | ICD-10-CM | POA: Diagnosis not present

## 2012-05-28 LAB — CBC
HCT: 28 % — ABNORMAL LOW (ref 36.0–46.0)
MCH: 31.6 pg (ref 26.0–34.0)
MCV: 93 fL (ref 78.0–100.0)
Platelets: 230 10*3/uL (ref 150–400)
RBC: 3.01 MIL/uL — ABNORMAL LOW (ref 3.87–5.11)

## 2012-05-28 LAB — BASIC METABOLIC PANEL
BUN: 12 mg/dL (ref 6–23)
CO2: 30 mEq/L (ref 19–32)
Calcium: 9 mg/dL (ref 8.4–10.5)
Chloride: 98 mEq/L (ref 96–112)
Creatinine, Ser: 0.6 mg/dL (ref 0.50–1.10)
Glucose, Bld: 98 mg/dL (ref 70–99)

## 2012-05-28 MED ORDER — ONDANSETRON HCL 4 MG PO TABS: 4.0000 mg | ORAL_TABLET | Freq: Four times a day (QID) | ORAL | Status: DC | PRN

## 2012-05-28 MED ORDER — BISACODYL 10 MG RE SUPP: 10.0000 mg | Freq: Every day | RECTAL | Status: DC | PRN

## 2012-05-28 MED ORDER — RIVAROXABAN 10 MG PO TABS: 10.0000 mg | ORAL_TABLET | Freq: Every day | ORAL | Status: DC

## 2012-05-28 MED ORDER — METHOCARBAMOL 500 MG PO TABS: 500.0000 mg | ORAL_TABLET | Freq: Four times a day (QID) | ORAL | Status: DC | PRN

## 2012-05-28 MED ORDER — ACETAMINOPHEN 325 MG PO TABS: 650.0000 mg | ORAL_TABLET | Freq: Four times a day (QID) | ORAL | Status: DC | PRN

## 2012-05-28 MED ORDER — HYDROCODONE-ACETAMINOPHEN 5-325 MG PO TABS
1.0000 | ORAL_TABLET | ORAL | Status: DC | PRN
Start: 2012-05-28 — End: 2014-09-10

## 2012-05-28 MED ORDER — POLYETHYLENE GLYCOL 3350 17 G PO PACK: 17.0000 g | PACK | Freq: Every day | ORAL | Status: DC | PRN

## 2012-05-28 MED ORDER — DSS 100 MG PO CAPS: 100.0000 mg | ORAL_CAPSULE | Freq: Two times a day (BID) | ORAL | Status: DC

## 2012-05-28 NOTE — Progress Notes (Signed)
   Subjective: 3 Days Post-Op Procedure(s) (LRB): TOTAL KNEE ARTHROPLASTY (Right) Patient reports pain as mild.   Patient seen in rounds by Dr. Lequita Halt. Patient is well, and has had no acute complaints or problems Patient is ready to go to the SNF - [facility].  Objective: Vital signs in last 24 hours: Temp:  [97.7 F (36.5 C)-99 F (37.2 C)] 97.7 F (36.5 C) (01/16 4098) Pulse Rate:  [89-90] 90  (01/16 0613) Resp:  [16] 16  (01/16 0613) BP: (132-175)/(64-76) 175/76 mmHg (01/16 0613) SpO2:  [95 %-96 %] 95 % (01/16 1191)  Intake/Output from previous day:  Intake/Output Summary (Last 24 hours) at 05/28/12 0810 Last data filed at 05/27/12 1739  Gross per 24 hour  Intake    480 ml  Output   2650 ml  Net  -2170 ml    Intake/Output this shift:    Labs:  Basename 05/28/12 0415 05/27/12 0420 05/26/12 0444  HGB 9.5* 9.7* 9.9*    Basename 05/28/12 0415 05/27/12 0420  WBC 6.3 8.3  RBC 3.01* 3.04*  HCT 28.0* 28.1*  PLT 230 191    Basename 05/28/12 0415 05/27/12 0420  NA 134* 132*  K 3.9 3.0*  CL 98 96  CO2 30 28  BUN 12 8  CREATININE 0.60 0.52  GLUCOSE 98 112*  CALCIUM 9.0 8.9   No results found for this basename: LABPT:2,INR:2 in the last 72 hours  EXAM: General - Patient is Alert, Appropriate and Oriented Extremity - Neurovascular intact Sensation intact distally Dorsiflexion/Plantar flexion intact No cellulitis present Incision - clean, dry, no drainage, healing Motor Function - intact, moving foot and toes well on exam.   Assessment/Plan: 3 Days Post-Op Procedure(s) (LRB): TOTAL KNEE ARTHROPLASTY (Right) Procedure(s) (LRB): TOTAL KNEE ARTHROPLASTY (Right) Past Medical History  Diagnosis Date  . Arthritis   . DUB (dysfunctional uterine bleeding)   . Ovarian cyst   . Hypertension   . Lichen plano-pilaris   . Complication of anesthesia   . PONV (postoperative nausea and vomiting)   . Fibromyalgia     PT THINKS SHE HAS FIBROMYALGIA  . Rash    RECURRENT  . GERD (gastroesophageal reflux disease)   . Constipation, chronic    Principal Problem:  *OA (osteoarthritis) of knee Active Problems:  Postop Acute blood loss anemia  Postop Hyponatremia  Postop Hypokalemia  Estimated Body mass index is 30.06 kg/(m^2) as calculated from the following:   Height as of this encounter: 5' 2.5"(1.588 m).   Weight as of this encounter: 167 lb(75.751 kg). Up with therapy Discharge to SNF Diet - Cardiac diet Follow up - in 2 weeks on Tuesday the 28th Activity - WBAT Disposition - Skilled nursing facility Condition Upon Discharge - Good D/C Meds - See DC Summary DVT Prophylaxis - Xarelto, ASA 325 mg on hold   PERKINS, ALEXZANDREW 05/28/2012, 8:10 AM

## 2012-05-28 NOTE — Discharge Summary (Signed)
Physician Discharge Summary   Patient ID: Crystal Ramsey MRN: [id number] DOB/AGE: 1936-01-17 77 y.o.  Admit date: 05/25/2012 Discharge date: 05/28/2012  Primary Diagnosis: Osteoarthritis Right knee   Admission Diagnoses:  Past Medical History  Diagnosis Date  . Arthritis   . DUB (dysfunctional uterine bleeding)   . Ovarian cyst   . Hypertension   . Lichen plano-pilaris   . Complication of anesthesia   . PONV (postoperative nausea and vomiting)   . Fibromyalgia     PT THINKS SHE HAS FIBROMYALGIA  . Rash     RECURRENT  . GERD (gastroesophageal reflux disease)   . Constipation, chronic    Discharge Diagnoses:   Principal Problem:  *OA (osteoarthritis) of knee Active Problems:  Postop Acute blood loss anemia  Postop Hyponatremia  Postop Hypokalemia  Estimated Body mass index is 30.06 kg/(m^2) as calculated from the following:   Height as of this encounter: 5' 2.5"(1.588 m).   Weight as of this encounter: 167 lb(75.751 kg).  Classification of overweight in adults according to BMI (WHO, 1998)   Procedure:  Procedure(s) (LRB): TOTAL KNEE ARTHROPLASTY (Right)   Consults: None  HPI: Crystal Ramsey is a 77 y.o. year old female with end stage OA of her right knee with progressively worsening pain and dysfunction. She has constant pain, with activity and at rest and significant functional deficits with difficulties even with ADLs. She has had extensive non-op management including analgesics, injections of cortisone, and home exercise program, but remains in significant pain with significant dysfunction.Radiographs show bone on bone arthritis medial and patellofemoral with large varus deformity. She presents now for right Total Knee Arthroplasty.   Laboratory Data: Admission on 05/25/2012  Component Date Value Range Status  . ABO/RH(D) 05/25/2012 O POS   Final  . Antibody Screen 05/25/2012 NEG   Final  . Sample Expiration 05/25/2012 05/28/2012   Final  . ABO/RH(D)  05/25/2012 O POS   Final  . WBC 05/26/2012 5.6  4.0 - 10.5 K/uL Final  . RBC 05/26/2012 3.15* 3.87 - 5.11 MIL/uL Final  . Hemoglobin 05/26/2012 9.9* 12.0 - 15.0 g/dL Final  . HCT 40/98/1191 29.4* 36.0 - 46.0 % Final  . MCV 05/26/2012 93.3  78.0 - 100.0 fL Final  . MCH 05/26/2012 31.4  26.0 - 34.0 pg Final  . MCHC 05/26/2012 33.7  30.0 - 36.0 g/dL Final  . RDW 47/82/9562 13.3  11.5 - 15.5 % Final  . Platelets 05/26/2012 202  150 - 400 K/uL Final  . Sodium 05/26/2012 133* 135 - 145 mEq/L Final  . Potassium 05/26/2012 3.2* 3.5 - 5.1 mEq/L Final  . Chloride 05/26/2012 97  96 - 112 mEq/L Final  . CO2 05/26/2012 29  19 - 32 mEq/L Final  . Glucose, Bld 05/26/2012 133* 70 - 99 mg/dL Final  . BUN 13/12/6576 11  6 - 23 mg/dL Final  . Creatinine, Ser 05/26/2012 0.58  0.50 - 1.10 mg/dL Final  . Calcium 46/96/2952 8.3* 8.4 - 10.5 mg/dL Final  . GFR calc non Af Amer 05/26/2012 89* >90 mL/min Final  . GFR calc Af Amer 05/26/2012 >90  >90 mL/min Final   Comment:                                 The eGFR has been calculated  using the CKD EPI equation.                          This calculation has not been                          validated in all clinical                          situations.                          eGFR's persistently                          <90 mL/min signify                          possible Chronic Kidney Disease.  . WBC 05/27/2012 8.3  4.0 - 10.5 K/uL Final  . RBC 05/27/2012 3.04* 3.87 - 5.11 MIL/uL Final  . Hemoglobin 05/27/2012 9.7* 12.0 - 15.0 g/dL Final  . HCT 78/29/5621 28.1* 36.0 - 46.0 % Final  . MCV 05/27/2012 92.4  78.0 - 100.0 fL Final  . MCH 05/27/2012 31.9  26.0 - 34.0 pg Final  . MCHC 05/27/2012 34.5  30.0 - 36.0 g/dL Final  . RDW 30/86/5784 12.8  11.5 - 15.5 % Final  . Platelets 05/27/2012 191  150 - 400 K/uL Final  . Sodium 05/27/2012 132* 135 - 145 mEq/L Final  . Potassium 05/27/2012 3.0* 3.5 - 5.1 mEq/L Final  . Chloride  05/27/2012 96  96 - 112 mEq/L Final  . CO2 05/27/2012 28  19 - 32 mEq/L Final  . Glucose, Bld 05/27/2012 112* 70 - 99 mg/dL Final  . BUN 69/62/9528 8  6 - 23 mg/dL Final  . Creatinine, Ser 05/27/2012 0.52  0.50 - 1.10 mg/dL Final  . Calcium 41/32/4401 8.9  8.4 - 10.5 mg/dL Final  . GFR calc non Af Amer 05/27/2012 >90  >90 mL/min Final  . GFR calc Af Amer 05/27/2012 >90  >90 mL/min Final   Comment:                                 The eGFR has been calculated                          using the CKD EPI equation.                          This calculation has not been                          validated in all clinical                          situations.                          eGFR's persistently                          <90 mL/min signify  possible Chronic Kidney Disease.  . WBC 05/28/2012 6.3  4.0 - 10.5 K/uL Final  . RBC 05/28/2012 3.01* 3.87 - 5.11 MIL/uL Final  . Hemoglobin 05/28/2012 9.5* 12.0 - 15.0 g/dL Final  . HCT 16/02/9603 28.0* 36.0 - 46.0 % Final  . MCV 05/28/2012 93.0  78.0 - 100.0 fL Final  . MCH 05/28/2012 31.6  26.0 - 34.0 pg Final  . MCHC 05/28/2012 33.9  30.0 - 36.0 g/dL Final  . RDW 54/01/8118 13.0  11.5 - 15.5 % Final  . Platelets 05/28/2012 230  150 - 400 K/uL Final  . Sodium 05/28/2012 134* 135 - 145 mEq/L Final  . Potassium 05/28/2012 3.9  3.5 - 5.1 mEq/L Final   Comment: DELTA CHECK NOTED                          NO VISIBLE HEMOLYSIS                          REPEATED TO VERIFY  . Chloride 05/28/2012 98  96 - 112 mEq/L Final  . CO2 05/28/2012 30  19 - 32 mEq/L Final  . Glucose, Bld 05/28/2012 98  70 - 99 mg/dL Final  . BUN 14/78/2956 12  6 - 23 mg/dL Final  . Creatinine, Ser 05/28/2012 0.60  0.50 - 1.10 mg/dL Final  . Calcium 21/30/8657 9.0  8.4 - 10.5 mg/dL Final  . GFR calc non Af Amer 05/28/2012 88* >90 mL/min Final  . GFR calc Af Amer 05/28/2012 >90  >90 mL/min Final   Comment:                                 The eGFR has been  calculated                          using the CKD EPI equation.                          This calculation has not been                          validated in all clinical                          situations.                          eGFR's persistently                          <90 mL/min signify                          possible Chronic Kidney Disease.  Hospital Outpatient Visit on 05/15/2012  Component Date Value Range Status  . MRSA, PCR 05/15/2012 NEGATIVE  NEGATIVE Final  . Staphylococcus aureus 05/15/2012 NEGATIVE  NEGATIVE Final   Comment:                                 The Xpert SA Assay (FDA  approved for NASAL specimens                          in patients over 28 years of age),                          is one component of                          a comprehensive surveillance                          program.  Test performance has                          been validated by [employer] for patients greater                          than or equal to 89 year old.                          It is not intended                          to diagnose infection nor to                          guide or monitor treatment.  [name] aPTT 05/15/2012 35  24 - 37 seconds Final  . WBC 05/15/2012 4.7  4.0 - 10.5 K/uL Final  . RBC 05/15/2012 4.35  3.87 - 5.11 MIL/uL Final  . Hemoglobin 05/15/2012 13.8  12.0 - 15.0 g/dL Final  . HCT 16/02/9603 41.0  36.0 - 46.0 % Final  . MCV 05/15/2012 94.3  78.0 - 100.0 fL Final  . MCH 05/15/2012 31.7  26.0 - 34.0 pg Final  . MCHC 05/15/2012 33.7  30.0 - 36.0 g/dL Final  . RDW 54/01/8118 13.3  11.5 - 15.5 % Final  . Platelets 05/15/2012 272  150 - 400 K/uL Final  . Sodium 05/15/2012 134* 135 - 145 mEq/L Final  . Potassium 05/15/2012 3.4* 3.5 - 5.1 mEq/L Final  . Chloride 05/15/2012 96  96 - 112 mEq/L Final  . CO2 05/15/2012 31  19 - 32 mEq/L Final  . Glucose, Bld 05/15/2012 96  70 - 99 mg/dL Final  . BUN  14/78/2956 15  6 - 23 mg/dL Final  . Creatinine, Ser 05/15/2012 0.65  0.50 - 1.10 mg/dL Final  . Calcium 21/30/8657 9.7  8.4 - 10.5 mg/dL Final  . Total Protein 05/15/2012 7.3  6.0 - 8.3 g/dL Final  . Albumin 84/69/6295 4.0  3.5 - 5.2 g/dL Final  . AST 28/41/3244 20  0 - 37 U/L Final  . ALT 05/15/2012 19  0 - 35 U/L Final  . Alkaline Phosphatase 05/15/2012 95  39 - 117 U/L Final  . Total Bilirubin 05/15/2012 0.3  0.3 - 1.2 mg/dL Final  . GFR calc non Af Amer 05/15/2012 85* >90 mL/min Final  . GFR calc Af Amer 05/15/2012 >90  >90 mL/min Final   Comment:  The eGFR has been calculated                          using the CKD EPI equation.                          This calculation has not been                          validated in all clinical                          situations.                          eGFR's persistently                          <90 mL/min signify                          possible Chronic Kidney Disease.  [name] Prothrombin Time 05/15/2012 12.8  11.6 - 15.2 seconds Final  . INR 05/15/2012 0.97  0.00 - 1.49 Final  . Color, Urine 05/15/2012 YELLOW  YELLOW Final  . APPearance 05/15/2012 CLEAR  CLEAR Final  . Specific Gravity, Urine 05/15/2012 1.013  1.005 - 1.030 Final  . pH 05/15/2012 5.5  5.0 - 8.0 Final  . Glucose, UA 05/15/2012 NEGATIVE  NEGATIVE mg/dL Final  . Hgb urine dipstick 05/15/2012 NEGATIVE  NEGATIVE Final  . Bilirubin Urine 05/15/2012 NEGATIVE  NEGATIVE Final  . Ketones, ur 05/15/2012 NEGATIVE  NEGATIVE mg/dL Final  . Protein, ur 69/62/9528 NEGATIVE  NEGATIVE mg/dL Final  . Urobilinogen, UA 05/15/2012 0.2  0.0 - 1.0 mg/dL Final  . Nitrite 41/32/4401 NEGATIVE  NEGATIVE Final  . Leukocytes, UA 05/15/2012 TRACE* NEGATIVE Final  . Squamous Epithelial / LPF 05/15/2012 FEW* RARE Final  . WBC, UA 05/15/2012 0-2  <3 WBC/hpf Final  . Bacteria, UA 05/15/2012 FEW* RARE Final     X-Rays:Dg Chest 2 View  05/25/2012  *RADIOLOGY REPORT*   Clinical Data: Preoperative for knee surgery.  Hypertension. Former smoker.  CHEST - 2 VIEW  Comparison: 05/15/2012  Findings: The heart size and pulmonary vascularity are normal. The lungs appear clear and expanded without focal air space disease or consolidation. No blunting of the costophrenic angles.  Tortuous aorta.  No pneumothorax.  Mediastinal contours appear intact. Vague nodular opacity in the left upper lung medially is stable since previous study.  Degenerative changes in the spine and shoulders.  IMPRESSION: No evidence of active pulmonary disease.  No significant change since previous study.   Original Report Authenticated By: Burman Nieves, M.D.    Dg Chest 2 View  05/15/2012  *RADIOLOGY REPORT*  Clinical Data: Preop for right total knee replacement, hypertension, former smoking history  CHEST - 2 VIEW  Comparison: None.  Findings: No active infiltrate or effusion is seen.  There is a nodular opacity noted medially in the left upper lung field of questionable significance.  This may be bony in origin, but attention to this area on follow-up chest x-ray is recommended. Mediastinal contours appear normal.  The heart is borderline enlarged. There are degenerative changes throughout the thoracic spine.  IMPRESSION:  1.  No active lung disease. 2.  Nodular opacity in the medial  left upper lung field of questionable significance.  Recommend attention to this area on follow-up chest x-ray.   Original Report Authenticated By: Dwyane Dee, M.D.    Ct Chest Wo Contrast  05/27/2012  *RADIOLOGY REPORT*  Clinical Data: Follow-up possible lung nodule on preoperative chest radiograph.  CT CHEST WITHOUT CONTRAST  Technique:  Multidetector CT imaging of the chest was performed following the standard protocol without IV contrast.  Comparison: Chest radiograph 05/25/2012.  Findings: No pathologically enlarged mediastinal, hilar or axillary lymph nodes.  Heart size normal.  No pericardial effusion.  Small hiatal  hernia.  There are a few scattered small pulmonary nodules measuring 4 mm or less in size (example, right upper lobe, image 27).  Minimal subsegmental atelectasis or scarring in the lower lobes dependently.  No pleural fluid.  Airway is unremarkable.  Esophagus is mildly dilated inferiorly.  In the upper chest, there are rounded low attenuation lesions adjacent to the upper thoracic spine, measuring up to 1.6 cm on the right (image 7).  Incidental imaging of the upper abdomen shows low attenuation lesions in the liver measuring up to 8.7 cm on the right.  These are likely cysts.  No worrisome lytic or sclerotic lesions.  IMPRESSION:  No worrisome pulmonary nodules.  Scattered tiny nonspecific pulmonary nodules.   Given risk factors for bronchogenic carcinoma, follow-up chest CT at 1 year is recommended.  This recommendation follows the consensus statement:  Guidelines for Management of Small Pulmonary Nodules Detected on CT Scans:  A Statement from the Fleischner Society as published in Radiology 2005; 237:395-400. 2.  Possible small neurogenic tumors along the upper thoracic spine.  This is considered an incidental finding in an asymptomatic patient.   Original Report Authenticated By: Leanna Battles, M.D.     EKG: Orders placed during the hospital encounter of 05/15/12  . EKG 12-LEAD  . EKG 12-LEAD     Hospital Course: Crystal Ramsey is a 77 y.o. who was admitted to [facility]. They were brought to the operating room on 05/25/2012 and underwent Procedure(s): TOTAL KNEE ARTHROPLASTY.  Patient tolerated the procedure well and was later transferred to the recovery room and then to the orthopaedic floor for postoperative care.  They were given PO and IV analgesics for pain control following their surgery.  They were given 24 hours of postoperative antibiotics of  Anti-infectives     Start     Dose/Rate Route Frequency Ordered Stop   05/25/12 1900   vancomycin (VANCOCIN) IVPB 1000 mg/200 mL  premix        1,000 mg 200 mL/hr over 60 Minutes Intravenous  Once 05/25/12 0814 05/25/12 1847   05/25/12 0556   vancomycin (VANCOCIN) 1,500 mg in sodium chloride 0.9 % 500 mL IVPB        1,500 mg 250 mL/hr over 120 Minutes Intravenous 120 min pre-op 05/25/12 0556 05/25/12 0703         and started on DVT prophylaxis in the form of Xarelto.   PT and OT were ordered for total joint protocol.  Discharge planning consulted to help with postop disposition and equipment needs.  Patient had a decent night on the evening of surgery and started to get up OOB with therapy on day one. Hemovac drain was pulled without difficulty.  Continued to work with therapy into day two.  Dressing was changed on day two and the incision was healing well.  By day three, the patient had progressed with therapy and meeting their goals.  Incision was healing well.  Patient was seen in rounds and was ready to go tot he SNF.   Discharge Medications: Prior to Admission medications   Medication Sig Start Date End Date Taking? Authorizing Provider  amiloride-hydrochlorothiazide (MODURETIC) 5-50 MG tablet Take 0.5 tablets by mouth every evening. Takes 1/2 tablet   Yes Historical Provider, MD  clobetasol cream (TEMOVATE) 0.05 % Apply topically 2 (two) times daily. 12/12/11  Yes Trellis Paganini, MD  diphenhydrAMINE (BENADRYL) 25 MG tablet Take 12.5 mg by mouth at bedtime. Takes 1/2 tablet   Yes Historical Provider, MD  hydrocortisone cream 1 % Apply topically 2 (two) times daily as needed.   Yes Historical Provider, MD  olopatadine (PATANOL) 0.1 % ophthalmic solution Place 1 drop into both eyes 2 (two) times daily.   Yes Historical Provider, MD  omeprazole (PRILOSEC OTC) 20 MG tablet Take 20 mg by mouth every other day.   Yes Historical Provider, MD  potassium chloride (KLOR-CON) 20 MEQ packet Take 20 mEq by mouth daily.   Yes Historical Provider, MD  acetaminophen (TYLENOL) 325 MG tablet Take 2 tablets (650 mg total) by mouth  every 6 (six) hours as needed (or Fever >/= 101). 05/28/12   Jaekwon Mcclune Julien Girt, PA  bisacodyl (DULCOLAX) 10 MG suppository Place 1 suppository (10 mg total) rectally daily as needed. 05/28/12   Aleyna Cueva, PA  docusate sodium 100 MG CAPS Take 100 mg by mouth 2 (two) times daily. 05/28/12   Valerio Pinard Julien Girt, PA  HYDROcodone-acetaminophen (NORCO/VICODIN) 5-325 MG per tablet Take 1-2 tablets by mouth every 4 (four) hours as needed. 05/28/12   Cailin Gebel Julien Girt, PA  methocarbamol (ROBAXIN) 500 MG tablet Take 1 tablet (500 mg total) by mouth every 6 (six) hours as needed. 05/28/12   Ester Hilley, PA  ondansetron (ZOFRAN) 4 MG tablet Take 1 tablet (4 mg total) by mouth every 6 (six) hours as needed for nausea. 05/28/12   Yasmina Chico, PA  polyethylene glycol (MIRALAX / GLYCOLAX) packet Take 17 g by mouth daily as needed. 05/28/12   Keoni Risinger Julien Girt, PA  rivaroxaban (XARELTO) 10 MG TABS tablet Take 1 tablet (10 mg total) by mouth daily with breakfast. Take Xarelto for two and a half more weeks, then discontinue Xarelto. Once the patient has completed the Xarelto, they may resume the 325 mg Aspirin. 05/28/12   Zynia Wojtowicz Julien Girt, PA    Diet: Cardiac diet Activity:WBAT Follow-up:in 2 weeks  On Tuesday the 28th Disposition - Skilled nursing facility - [facility] Discharged Condition: good   Discharge Orders    Future Orders Please Complete By Expires   Diet - low sodium heart healthy      Call MD / Call 911      Comments:   If you experience chest pain or shortness of breath, CALL 911 and be transported to the hospital emergency room.  If you develope a fever above 101 F, pus (white drainage) or increased drainage or redness at the wound, or calf pain, call your surgeon's office.   Discharge instructions      Comments:   Pick up stool softner and laxative for home. Do not submerge incision under water. May shower. Continue to use ice for pain and swelling from  surgery.  Take Xarelto for two and a half more weeks, then discontinue Xarelto. Once the patient has completed the Xarelto, they may resume the 325 mg Aspirin.  When discharged from the skilled rehab facility, please have the facility set up the patient's Home Health  Physical Therapy prior to being released.  Also provide the patient with their medications at time of release from the facility to include their pain medication, the muscle relaxants, and their blood thinner medication.  If the patient is still at the rehab facility at time of follow up appointment, please also assist the patient in arranging follow up appointment in our office and any transportation needs.   Constipation Prevention      Comments:   Drink plenty of fluids.  Prune juice may be helpful.  You may use a stool softener, such as Colace (over the counter) 100 mg twice a day.  Use MiraLax (over the counter) for constipation as needed.   Increase activity slowly as tolerated      Patient may shower      Comments:   You may shower without a dressing once there is no drainage.  Do not wash over the wound.  If drainage remains, do not shower until drainage stops.   Weight bearing as tolerated      Driving restrictions      Comments:   No driving until released by the physician.   Lifting restrictions      Comments:   No lifting until released by the physician.   TED hose      Comments:   Use stockings (TED hose) for 3 weeks on both leg(s).  You may remove them at night for sleeping.   Change dressing      Comments:   Change dressing daily with sterile 4 x 4 inch gauze dressing and apply TED hose. Do not submerge the incision under water.   Do not put a pillow under the knee. Place it under the heel.      Do not sit on low chairs, stoools or toilet seats, as it may be difficult to get up from low surfaces          Medication List     As of 05/28/2012  8:17 AM    STOP taking these medications         aspirin 325 MG  tablet      cholecalciferol 1000 UNITS tablet   Commonly known as: VITAMIN D      TUMS 500 MG chewable tablet   Generic drug: calcium carbonate      TAKE these medications         acetaminophen 325 MG tablet   Commonly known as: TYLENOL   Take 2 tablets (650 mg total) by mouth every 6 (six) hours as needed (or Fever >/= 101).      amiloride-hydrochlorothiazide 5-50 MG tablet   Commonly known as: MODURETIC   Take 0.5 tablets by mouth every evening. Takes 1/2 tablet      bisacodyl 10 MG suppository   Commonly known as: DULCOLAX   Place 1 suppository (10 mg total) rectally daily as needed.      clobetasol cream 0.05 %   Commonly known as: TEMOVATE   Apply topically 2 (two) times daily.      diphenhydrAMINE 25 MG tablet   Commonly known as: BENADRYL   Take 12.5 mg by mouth at bedtime. Takes 1/2 tablet      DSS 100 MG Caps   Take 100 mg by mouth 2 (two) times daily.      HYDROcodone-acetaminophen 5-325 MG per tablet   Commonly known as: NORCO/VICODIN   Take 1-2 tablets by mouth every 4 (four) hours as needed.      hydrocortisone cream  1 %   Apply topically 2 (two) times daily as needed.      methocarbamol 500 MG tablet   Commonly known as: ROBAXIN   Take 1 tablet (500 mg total) by mouth every 6 (six) hours as needed.      olopatadine 0.1 % ophthalmic solution   Commonly known as: PATANOL   Place 1 drop into both eyes 2 (two) times daily.      omeprazole 20 MG tablet   Commonly known as: PRILOSEC OTC   Take 20 mg by mouth every other day.      ondansetron 4 MG tablet   Commonly known as: ZOFRAN   Take 1 tablet (4 mg total) by mouth every 6 (six) hours as needed for nausea.      polyethylene glycol packet   Commonly known as: MIRALAX / GLYCOLAX   Take 17 g by mouth daily as needed.      potassium chloride 20 MEQ packet   Commonly known as: KLOR-CON   Take 20 mEq by mouth daily.      rivaroxaban 10 MG Tabs tablet   Commonly known as: XARELTO   Take 1 tablet  (10 mg total) by mouth daily with breakfast. Take Xarelto for two and a half more weeks, then discontinue Xarelto.  Once the patient has completed the Xarelto, they may resume the 325 mg Aspirin.           Follow-up Information    Follow up with Loanne Drilling, MD. Schedule an appointment as soon as possible for a visit on 06/09/2012.   Contact information:   [address] [place] [address] [phone]          Signed: Patrica Duel 05/28/2012, 8:17 AM

## 2012-05-29 NOTE — Progress Notes (Signed)
Clinical Social Work Department CLINICAL SOCIAL WORK PLACEMENT NOTE 05/29/2012  Patient:  YELENA, METZER  Account Number:  [id number] Admit date:  05/25/2012  Clinical Social Worker:  Cori Razor, LCSW  Date/time:  05/25/2012 04:11 PM  Clinical Social Work is seeking post-discharge placement for this patient at the following level of care:   SKILLED NURSING   (*CSW will update this form in Epic as items are completed)   05/25/2012  Patient/family provided with [facility] Department of Clinical Social Work's list of facilities offering this level of care within the geographic area requested by the patient (or if unable, by the patient's family).    Patient/family informed of their freedom to choose among providers that offer the needed level of care, that participate in Medicare, Medicaid or managed care program needed by the patient, have an available bed and are willing to accept the patient.    Patient/family informed of [facility]' ownership interest in [facility], as well as of the fact that they are under no obligation to receive care at this facility.  PASARR submitted to [employer] on 05/26/2012 PASARR number received from [employer] on   FL2 transmitted to all facilities in geographic area requested by pt/family on  05/26/2012 FL2 transmitted to all facilities within larger geographic area on   Patient informed that his/her managed care company has contracts with or will negotiate with  certain facilities, including the following:     Patient/family informed of bed offers received:  05/26/2012 Patient chooses bed at [facility] Physician recommends and patient chooses bed at    Patient to be transferred to [facility] on  05/28/2012 Patient to be transferred to facility by [facility]  The following physician request were entered in Epic:   Additional Comments:  amie Andrzej Scully LCSW [phone]

## 2012-06-03 DIAGNOSIS — I10 Essential (primary) hypertension: Secondary | ICD-10-CM | POA: Diagnosis not present

## 2012-06-03 DIAGNOSIS — D62 Acute posthemorrhagic anemia: Secondary | ICD-10-CM | POA: Diagnosis not present

## 2012-06-03 DIAGNOSIS — E876 Hypokalemia: Secondary | ICD-10-CM | POA: Diagnosis not present

## 2012-06-08 DIAGNOSIS — M1710 Unilateral primary osteoarthritis, unspecified knee: Secondary | ICD-10-CM | POA: Diagnosis not present

## 2012-06-08 DIAGNOSIS — IMO0001 Reserved for inherently not codable concepts without codable children: Secondary | ICD-10-CM | POA: Diagnosis not present

## 2012-06-08 DIAGNOSIS — Z4801 Encounter for change or removal of surgical wound dressing: Secondary | ICD-10-CM | POA: Diagnosis not present

## 2012-06-08 DIAGNOSIS — Z96659 Presence of unspecified artificial knee joint: Secondary | ICD-10-CM | POA: Diagnosis not present

## 2012-06-08 DIAGNOSIS — I10 Essential (primary) hypertension: Secondary | ICD-10-CM | POA: Diagnosis not present

## 2012-06-08 DIAGNOSIS — Z471 Aftercare following joint replacement surgery: Secondary | ICD-10-CM | POA: Diagnosis not present

## 2012-06-10 DIAGNOSIS — IMO0001 Reserved for inherently not codable concepts without codable children: Secondary | ICD-10-CM | POA: Diagnosis not present

## 2012-06-10 DIAGNOSIS — I10 Essential (primary) hypertension: Secondary | ICD-10-CM | POA: Diagnosis not present

## 2012-06-10 DIAGNOSIS — Z4801 Encounter for change or removal of surgical wound dressing: Secondary | ICD-10-CM | POA: Diagnosis not present

## 2012-06-10 DIAGNOSIS — Z96659 Presence of unspecified artificial knee joint: Secondary | ICD-10-CM | POA: Diagnosis not present

## 2012-06-10 DIAGNOSIS — Z471 Aftercare following joint replacement surgery: Secondary | ICD-10-CM | POA: Diagnosis not present

## 2012-06-12 DIAGNOSIS — I10 Essential (primary) hypertension: Secondary | ICD-10-CM | POA: Diagnosis not present

## 2012-06-12 DIAGNOSIS — Z471 Aftercare following joint replacement surgery: Secondary | ICD-10-CM | POA: Diagnosis not present

## 2012-06-12 DIAGNOSIS — Z96659 Presence of unspecified artificial knee joint: Secondary | ICD-10-CM | POA: Diagnosis not present

## 2012-06-12 DIAGNOSIS — Z4801 Encounter for change or removal of surgical wound dressing: Secondary | ICD-10-CM | POA: Diagnosis not present

## 2012-06-12 DIAGNOSIS — IMO0001 Reserved for inherently not codable concepts without codable children: Secondary | ICD-10-CM | POA: Diagnosis not present

## 2012-06-14 DIAGNOSIS — Z96659 Presence of unspecified artificial knee joint: Secondary | ICD-10-CM | POA: Diagnosis not present

## 2012-06-14 DIAGNOSIS — Z4801 Encounter for change or removal of surgical wound dressing: Secondary | ICD-10-CM | POA: Diagnosis not present

## 2012-06-14 DIAGNOSIS — IMO0001 Reserved for inherently not codable concepts without codable children: Secondary | ICD-10-CM | POA: Diagnosis not present

## 2012-06-14 DIAGNOSIS — I10 Essential (primary) hypertension: Secondary | ICD-10-CM | POA: Diagnosis not present

## 2012-06-14 DIAGNOSIS — Z471 Aftercare following joint replacement surgery: Secondary | ICD-10-CM | POA: Diagnosis not present

## 2012-06-15 DIAGNOSIS — Z96659 Presence of unspecified artificial knee joint: Secondary | ICD-10-CM | POA: Diagnosis not present

## 2012-06-17 DIAGNOSIS — M1710 Unilateral primary osteoarthritis, unspecified knee: Secondary | ICD-10-CM | POA: Diagnosis not present

## 2012-06-19 DIAGNOSIS — M1710 Unilateral primary osteoarthritis, unspecified knee: Secondary | ICD-10-CM | POA: Diagnosis not present

## 2012-06-22 DIAGNOSIS — M1710 Unilateral primary osteoarthritis, unspecified knee: Secondary | ICD-10-CM | POA: Diagnosis not present

## 2012-06-26 DIAGNOSIS — M1710 Unilateral primary osteoarthritis, unspecified knee: Secondary | ICD-10-CM | POA: Diagnosis not present

## 2012-06-29 DIAGNOSIS — M1710 Unilateral primary osteoarthritis, unspecified knee: Secondary | ICD-10-CM | POA: Diagnosis not present

## 2012-07-01 DIAGNOSIS — M1710 Unilateral primary osteoarthritis, unspecified knee: Secondary | ICD-10-CM | POA: Diagnosis not present

## 2012-07-02 DIAGNOSIS — Z96659 Presence of unspecified artificial knee joint: Secondary | ICD-10-CM | POA: Diagnosis not present

## 2012-07-03 DIAGNOSIS — M1710 Unilateral primary osteoarthritis, unspecified knee: Secondary | ICD-10-CM | POA: Diagnosis not present

## 2012-07-06 DIAGNOSIS — M1710 Unilateral primary osteoarthritis, unspecified knee: Secondary | ICD-10-CM | POA: Diagnosis not present

## 2012-07-08 DIAGNOSIS — M1710 Unilateral primary osteoarthritis, unspecified knee: Secondary | ICD-10-CM | POA: Diagnosis not present

## 2012-07-10 DIAGNOSIS — M1710 Unilateral primary osteoarthritis, unspecified knee: Secondary | ICD-10-CM | POA: Diagnosis not present

## 2012-07-13 DIAGNOSIS — M1710 Unilateral primary osteoarthritis, unspecified knee: Secondary | ICD-10-CM | POA: Diagnosis not present

## 2012-07-15 DIAGNOSIS — M1710 Unilateral primary osteoarthritis, unspecified knee: Secondary | ICD-10-CM | POA: Diagnosis not present

## 2012-07-20 DIAGNOSIS — L089 Local infection of the skin and subcutaneous tissue, unspecified: Secondary | ICD-10-CM | POA: Diagnosis not present

## 2012-07-20 DIAGNOSIS — L439 Lichen planus, unspecified: Secondary | ICD-10-CM | POA: Diagnosis not present

## 2012-07-21 DIAGNOSIS — M1710 Unilateral primary osteoarthritis, unspecified knee: Secondary | ICD-10-CM | POA: Diagnosis not present

## 2012-07-22 DIAGNOSIS — M1710 Unilateral primary osteoarthritis, unspecified knee: Secondary | ICD-10-CM | POA: Diagnosis not present

## 2012-07-24 DIAGNOSIS — M1710 Unilateral primary osteoarthritis, unspecified knee: Secondary | ICD-10-CM | POA: Diagnosis not present

## 2012-07-27 DIAGNOSIS — M1710 Unilateral primary osteoarthritis, unspecified knee: Secondary | ICD-10-CM | POA: Diagnosis not present

## 2012-08-04 DIAGNOSIS — M1710 Unilateral primary osteoarthritis, unspecified knee: Secondary | ICD-10-CM | POA: Diagnosis not present

## 2012-08-19 DIAGNOSIS — L819 Disorder of pigmentation, unspecified: Secondary | ICD-10-CM | POA: Diagnosis not present

## 2012-08-19 DIAGNOSIS — L439 Lichen planus, unspecified: Secondary | ICD-10-CM | POA: Diagnosis not present

## 2012-08-19 DIAGNOSIS — L821 Other seborrheic keratosis: Secondary | ICD-10-CM | POA: Diagnosis not present

## 2012-08-19 DIAGNOSIS — L820 Inflamed seborrheic keratosis: Secondary | ICD-10-CM | POA: Diagnosis not present

## 2012-08-19 DIAGNOSIS — D1801 Hemangioma of skin and subcutaneous tissue: Secondary | ICD-10-CM | POA: Diagnosis not present

## 2012-08-19 DIAGNOSIS — D239 Other benign neoplasm of skin, unspecified: Secondary | ICD-10-CM | POA: Diagnosis not present

## 2012-08-20 DIAGNOSIS — H353190 Nonexudative age-related macular degeneration, unspecified eye, stage unspecified: Secondary | ICD-10-CM | POA: Diagnosis not present

## 2012-08-20 DIAGNOSIS — Z961 Presence of intraocular lens: Secondary | ICD-10-CM | POA: Diagnosis not present

## 2012-08-20 DIAGNOSIS — H2510 Age-related nuclear cataract, unspecified eye: Secondary | ICD-10-CM | POA: Diagnosis not present

## 2012-08-20 DIAGNOSIS — H43399 Other vitreous opacities, unspecified eye: Secondary | ICD-10-CM | POA: Diagnosis not present

## 2012-08-20 DIAGNOSIS — H40019 Open angle with borderline findings, low risk, unspecified eye: Secondary | ICD-10-CM | POA: Diagnosis not present

## 2012-09-15 DIAGNOSIS — I10 Essential (primary) hypertension: Secondary | ICD-10-CM | POA: Diagnosis not present

## 2012-10-08 DIAGNOSIS — M5126 Other intervertebral disc displacement, lumbar region: Secondary | ICD-10-CM | POA: Diagnosis not present

## 2012-10-08 DIAGNOSIS — M25559 Pain in unspecified hip: Secondary | ICD-10-CM | POA: Diagnosis not present

## 2012-10-08 DIAGNOSIS — M545 Low back pain: Secondary | ICD-10-CM | POA: Diagnosis not present

## 2012-10-21 DIAGNOSIS — M545 Low back pain: Secondary | ICD-10-CM | POA: Diagnosis not present

## 2012-10-26 DIAGNOSIS — H6980 Other specified disorders of Eustachian tube, unspecified ear: Secondary | ICD-10-CM | POA: Diagnosis not present

## 2012-10-26 DIAGNOSIS — H6120 Impacted cerumen, unspecified ear: Secondary | ICD-10-CM | POA: Diagnosis not present

## 2012-10-26 DIAGNOSIS — H903 Sensorineural hearing loss, bilateral: Secondary | ICD-10-CM | POA: Diagnosis not present

## 2012-10-28 DIAGNOSIS — M545 Low back pain: Secondary | ICD-10-CM | POA: Diagnosis not present

## 2012-11-03 DIAGNOSIS — Z471 Aftercare following joint replacement surgery: Secondary | ICD-10-CM | POA: Diagnosis not present

## 2012-11-03 DIAGNOSIS — M1710 Unilateral primary osteoarthritis, unspecified knee: Secondary | ICD-10-CM | POA: Diagnosis not present

## 2012-11-03 DIAGNOSIS — Z96659 Presence of unspecified artificial knee joint: Secondary | ICD-10-CM | POA: Diagnosis not present

## 2012-11-05 DIAGNOSIS — M545 Low back pain: Secondary | ICD-10-CM | POA: Diagnosis not present

## 2012-11-11 DIAGNOSIS — M545 Low back pain: Secondary | ICD-10-CM | POA: Diagnosis not present

## 2012-11-18 DIAGNOSIS — M545 Low back pain: Secondary | ICD-10-CM | POA: Diagnosis not present

## 2012-11-25 DIAGNOSIS — M5126 Other intervertebral disc displacement, lumbar region: Secondary | ICD-10-CM | POA: Diagnosis not present

## 2012-11-25 DIAGNOSIS — M545 Low back pain: Secondary | ICD-10-CM | POA: Diagnosis not present

## 2012-12-11 DIAGNOSIS — B977 Papillomavirus as the cause of diseases classified elsewhere: Secondary | ICD-10-CM

## 2012-12-11 HISTORY — DX: Papillomavirus as the cause of diseases classified elsewhere: B97.7

## 2012-12-17 ENCOUNTER — Ambulatory Visit (INDEPENDENT_AMBULATORY_CARE_PROVIDER_SITE_OTHER): Payer: Medicare Other | Admitting: Gynecology

## 2012-12-17 ENCOUNTER — Other Ambulatory Visit (HOSPITAL_COMMUNITY)
Admission: RE | Admit: 2012-12-17 | Discharge: 2012-12-17 | Disposition: A | Discharge status: Home / Self Care | Payer: Medicare Other | Source: Ambulatory Visit | Attending: Gynecology | Admitting: Gynecology

## 2012-12-17 ENCOUNTER — Encounter: Payer: Self-pay | Admitting: Gynecology

## 2012-12-17 VITALS — BP 124/78 | Ht 62.0 in | Wt 164.0 lb

## 2012-12-17 DIAGNOSIS — N952 Postmenopausal atrophic vaginitis: Secondary | ICD-10-CM | POA: Diagnosis not present

## 2012-12-17 DIAGNOSIS — R82998 Other abnormal findings in urine: Secondary | ICD-10-CM | POA: Diagnosis not present

## 2012-12-17 DIAGNOSIS — Z780 Asymptomatic menopausal state: Secondary | ICD-10-CM

## 2012-12-17 DIAGNOSIS — N760 Acute vaginitis: Secondary | ICD-10-CM

## 2012-12-17 DIAGNOSIS — Z1151 Encounter for screening for human papillomavirus (HPV): Secondary | ICD-10-CM | POA: Insufficient documentation

## 2012-12-17 DIAGNOSIS — Z01419 Encounter for gynecological examination (general) (routine) without abnormal findings: Secondary | ICD-10-CM | POA: Insufficient documentation

## 2012-12-17 DIAGNOSIS — R87810 Cervical high risk human papillomavirus (HPV) DNA test positive: Secondary | ICD-10-CM | POA: Insufficient documentation

## 2012-12-17 DIAGNOSIS — N762 Acute vulvitis: Secondary | ICD-10-CM

## 2012-12-17 NOTE — Patient Instructions (Signed)
Apply 1% hydrocortisone cream to the irritated skin of the breast. It persists let me know and I will have a dermatologist take a look. Followup in one year for annual exam.

## 2012-12-17 NOTE — Addendum Note (Signed)
Addended by: Dayna Barker on: 12/17/2012 10:58 AM   Modules accepted: Orders

## 2012-12-17 NOTE — Progress Notes (Signed)
Crystal Ramsey July 15, 1935 [id number]        77 y.o.  G2P1011 for followup exam.  Several issues noted below.  Past medical history,surgical history, medications, allergies, family history and social history were all reviewed and documented in the EPIC chart.  ROS:  Performed and pertinent positives and negatives are included in the history, assessment and plan .  Exam: Crystal Ramsey Filed Vitals:   12/17/12 1016  BP: 124/78  Height: 5\' 2"  (1.575 m)  Weight: 164 lb (74.39 kg)   General appearance  Normal Skin grossly normal Head/Neck normal with no cervical or supraclavicular adenopathy thyroid normal Lungs  clear Cardiac RR, without RMG Abdominal  soft, nontender, without masses, organomegaly or hernia Breasts  examined lying and sitting without masses, retractions, discharge or axillary adenopathy. Area of scaly skin right tail of Spence consistent with dry skin. Pelvic  Ext/BUS/vagina  normal with atrophic changes Pap of cuff done  Adnexa  Without masses or tenderness    Anus and perineum  normal   Rectovaginal  normal sphincter tone without palpated masses or tenderness.    Assessment/Plan:  77 y.o. G70P1011 female for followup exam, history of vaginal hysterectomy and left salpingo-oophorectomy.   1. History of vulvitis using Temovate 0.05% cream occasionally. Patient has a supply at home will call she needs more. Exam today is normal with atrophic changes no overt evidence to suggest lichen sclerosus. Is not having issues with chronic dryness or dyspareunia. 2. Scaly skin right breast of the last 2 days. Consistent with dry skin. No evidence of an inflammatory process. Will l apply hydrocortisone cream 1%. It persists then will have dermatology assess. 3. History of taking DES during pregnancy. Pap done today Reviewed with patient whether long-term effects in patients who took the DES versus their in utero children. Regardless we'll continue with screening her vaginal cuff.  Issue has to do it every year versus less frequent reviewed and we'll readdress on an annual basis. 4. Mammography. Patient is due for mammography now. She asked my opinion about 3D and I recommended she go ahead and get this. SBE monthly reviewed. 5. DEXA 2007 normal. Recommended repeat now is baseline. Increase calcium vitamin D reviewed. 6. Colonoscopy 2006. Repeat at their recommended interval. 7. Health maintenance. Note blood work done as this is all done through her primary physician's office. Followup one year, sooner as needed.  Note: This document was prepared with digital dictation and possible smart phrase technology. Any transcriptional errors that result from this process are unintentional.   Dara Lords MD, 10:47 AM 12/17/2012

## 2012-12-18 ENCOUNTER — Encounter: Payer: Self-pay | Admitting: Obstetrics and Gynecology

## 2012-12-18 LAB — URINALYSIS W MICROSCOPIC + REFLEX CULTURE
Casts: NONE SEEN
Crystals: NONE SEEN
Ketones, ur: NEGATIVE mg/dL
Nitrite: NEGATIVE
Specific Gravity, Urine: 1.022 (ref 1.005–1.030)
Urobilinogen, UA: 0.2 mg/dL (ref 0.0–1.0)
pH: 6 (ref 5.0–8.0)

## 2012-12-21 ENCOUNTER — Other Ambulatory Visit: Payer: Self-pay | Admitting: Gynecology

## 2012-12-21 LAB — URINE CULTURE

## 2012-12-21 MED ORDER — CIPROFLOXACIN HCL 250 MG PO TABS: 250.0000 mg | ORAL_TABLET | Freq: Two times a day (BID) | ORAL | Status: DC

## 2012-12-24 ENCOUNTER — Encounter: Payer: Self-pay | Admitting: Gynecology

## 2012-12-25 DIAGNOSIS — Z1231 Encounter for screening mammogram for malignant neoplasm of breast: Secondary | ICD-10-CM | POA: Diagnosis not present

## 2012-12-25 DIAGNOSIS — Z803 Family history of malignant neoplasm of breast: Secondary | ICD-10-CM | POA: Diagnosis not present

## 2012-12-28 ENCOUNTER — Encounter: Payer: Self-pay | Admitting: Gynecology

## 2013-01-19 DIAGNOSIS — M5126 Other intervertebral disc displacement, lumbar region: Secondary | ICD-10-CM | POA: Diagnosis not present

## 2013-01-22 DIAGNOSIS — M5126 Other intervertebral disc displacement, lumbar region: Secondary | ICD-10-CM | POA: Diagnosis not present

## 2013-01-29 DIAGNOSIS — M5126 Other intervertebral disc displacement, lumbar region: Secondary | ICD-10-CM | POA: Diagnosis not present

## 2013-02-10 DIAGNOSIS — L723 Sebaceous cyst: Secondary | ICD-10-CM | POA: Diagnosis not present

## 2013-02-10 DIAGNOSIS — L439 Lichen planus, unspecified: Secondary | ICD-10-CM | POA: Diagnosis not present

## 2013-02-18 ENCOUNTER — Other Ambulatory Visit: Payer: Self-pay | Admitting: Internal Medicine

## 2013-02-18 DIAGNOSIS — R911 Solitary pulmonary nodule: Secondary | ICD-10-CM

## 2013-02-21 DIAGNOSIS — Z23 Encounter for immunization: Secondary | ICD-10-CM | POA: Diagnosis not present

## 2013-02-24 DIAGNOSIS — M5126 Other intervertebral disc displacement, lumbar region: Secondary | ICD-10-CM | POA: Diagnosis not present

## 2013-03-10 DIAGNOSIS — M25559 Pain in unspecified hip: Secondary | ICD-10-CM | POA: Diagnosis not present

## 2013-03-18 ENCOUNTER — Other Ambulatory Visit: Payer: Self-pay

## 2013-04-13 ENCOUNTER — Ambulatory Visit
Admission: RE | Admit: 2013-04-13 | Discharge: 2013-04-13 | Disposition: A | Discharge status: Home / Self Care | Payer: Medicare Other | Source: Ambulatory Visit | Attending: Internal Medicine | Admitting: Internal Medicine

## 2013-04-13 DIAGNOSIS — K219 Gastro-esophageal reflux disease without esophagitis: Secondary | ICD-10-CM | POA: Diagnosis not present

## 2013-04-13 DIAGNOSIS — E876 Hypokalemia: Secondary | ICD-10-CM | POA: Diagnosis not present

## 2013-04-13 DIAGNOSIS — J984 Other disorders of lung: Secondary | ICD-10-CM | POA: Diagnosis not present

## 2013-04-13 DIAGNOSIS — I10 Essential (primary) hypertension: Secondary | ICD-10-CM | POA: Diagnosis not present

## 2013-04-13 DIAGNOSIS — E559 Vitamin D deficiency, unspecified: Secondary | ICD-10-CM | POA: Diagnosis not present

## 2013-04-13 DIAGNOSIS — R911 Solitary pulmonary nodule: Secondary | ICD-10-CM

## 2013-04-13 DIAGNOSIS — Z0000 Encounter for general adult medical examination without abnormal findings: Secondary | ICD-10-CM | POA: Diagnosis not present

## 2013-04-13 DIAGNOSIS — Z1331 Encounter for screening for depression: Secondary | ICD-10-CM | POA: Diagnosis not present

## 2013-04-28 DIAGNOSIS — E876 Hypokalemia: Secondary | ICD-10-CM | POA: Diagnosis not present

## 2013-06-01 DIAGNOSIS — Z471 Aftercare following joint replacement surgery: Secondary | ICD-10-CM | POA: Diagnosis not present

## 2013-06-01 DIAGNOSIS — M1710 Unilateral primary osteoarthritis, unspecified knee: Secondary | ICD-10-CM | POA: Diagnosis not present

## 2013-06-01 DIAGNOSIS — Z96659 Presence of unspecified artificial knee joint: Secondary | ICD-10-CM | POA: Diagnosis not present

## 2013-06-01 DIAGNOSIS — IMO0002 Reserved for concepts with insufficient information to code with codable children: Secondary | ICD-10-CM | POA: Diagnosis not present

## 2013-06-26 ENCOUNTER — Emergency Department (HOSPITAL_COMMUNITY): Payer: Medicare Other

## 2013-06-26 ENCOUNTER — Emergency Department (HOSPITAL_COMMUNITY)
Admission: EM | Admit: 2013-06-26 | Discharge: 2013-06-26 | Disposition: A | Discharge status: Home / Self Care | Payer: Medicare Other | Attending: Emergency Medicine | Admitting: Emergency Medicine

## 2013-06-26 ENCOUNTER — Encounter (HOSPITAL_COMMUNITY): Payer: Self-pay | Admitting: Emergency Medicine

## 2013-06-26 DIAGNOSIS — S0181XA Laceration without foreign body of other part of head, initial encounter: Secondary | ICD-10-CM

## 2013-06-26 DIAGNOSIS — Z880 Allergy status to penicillin: Secondary | ICD-10-CM | POA: Insufficient documentation

## 2013-06-26 DIAGNOSIS — Z87891 Personal history of nicotine dependence: Secondary | ICD-10-CM | POA: Insufficient documentation

## 2013-06-26 DIAGNOSIS — Y9301 Activity, walking, marching and hiking: Secondary | ICD-10-CM | POA: Insufficient documentation

## 2013-06-26 DIAGNOSIS — S0083XA Contusion of other part of head, initial encounter: Secondary | ICD-10-CM

## 2013-06-26 DIAGNOSIS — R296 Repeated falls: Secondary | ICD-10-CM | POA: Insufficient documentation

## 2013-06-26 DIAGNOSIS — S0990XA Unspecified injury of head, initial encounter: Secondary | ICD-10-CM | POA: Diagnosis not present

## 2013-06-26 DIAGNOSIS — IMO0001 Reserved for inherently not codable concepts without codable children: Secondary | ICD-10-CM | POA: Diagnosis not present

## 2013-06-26 DIAGNOSIS — S0180XA Unspecified open wound of other part of head, initial encounter: Secondary | ICD-10-CM | POA: Insufficient documentation

## 2013-06-26 DIAGNOSIS — Y929 Unspecified place or not applicable: Secondary | ICD-10-CM | POA: Insufficient documentation

## 2013-06-26 DIAGNOSIS — K219 Gastro-esophageal reflux disease without esophagitis: Secondary | ICD-10-CM | POA: Insufficient documentation

## 2013-06-26 DIAGNOSIS — S0100XA Unspecified open wound of scalp, initial encounter: Secondary | ICD-10-CM | POA: Diagnosis not present

## 2013-06-26 DIAGNOSIS — Z79899 Other long term (current) drug therapy: Secondary | ICD-10-CM | POA: Diagnosis not present

## 2013-06-26 DIAGNOSIS — Z8619 Personal history of other infectious and parasitic diseases: Secondary | ICD-10-CM | POA: Insufficient documentation

## 2013-06-26 DIAGNOSIS — S0003XA Contusion of scalp, initial encounter: Secondary | ICD-10-CM | POA: Diagnosis not present

## 2013-06-26 DIAGNOSIS — I10 Essential (primary) hypertension: Secondary | ICD-10-CM | POA: Diagnosis not present

## 2013-06-26 DIAGNOSIS — Z7982 Long term (current) use of aspirin: Secondary | ICD-10-CM | POA: Diagnosis not present

## 2013-06-26 DIAGNOSIS — M129 Arthropathy, unspecified: Secondary | ICD-10-CM | POA: Insufficient documentation

## 2013-06-26 DIAGNOSIS — S1093XA Contusion of unspecified part of neck, initial encounter: Secondary | ICD-10-CM | POA: Diagnosis not present

## 2013-06-26 NOTE — Discharge Instructions (Signed)
Please read and follow all provided instructions.  Your diagnoses today include:  1. Facial laceration   2. Facial contusion   3. Head injury     Tests performed today include:  CT of brain that did not show any serious injury or bleeding in the head  Vital signs. See below for your results today.   Medications prescribed:   None  Take any prescribed medications only as directed.   Home care instructions:  Follow any educational materials and wound care instructions contained in this packet.   Keep affected area above the level of your heart when possible to minimize swelling. Wash area gently twice a day with warm soapy water. Do not apply alcohol or hydrogen peroxide. Cover the area if it draining or weeping.   Follow-up instructions: Suture Removal: Return to the Emergency Department or see your primary care care doctor in 5 days for a recheck of your wound and removal of your sutures or staples.    If you do not have a primary care doctor -- see below for referral information.   Return instructions:  Return to the Emergency Department if you have:  Fever  Worsening pain  Worsening swelling of the wound  Pus draining from the wound  Redness of the skin that moves away from the wound, especially if it streaks away from the affected area   Any other emergent concerns  Your vital signs today were: BP 186/72   Pulse 85   Temp(Src) 97.6 F (36.4 C) (Oral)   Resp 14   SpO2 96% If your blood pressure (BP) was elevated above 135/85 this visit, please have this repeated by your doctor within one month. --------------

## 2013-06-26 NOTE — ED Provider Notes (Signed)
CSN: [id number]     Arrival date & time 06/26/13  1611 History   First MD Initiated Contact with Patient 06/26/13 1625     Chief Complaint  Patient presents with  . Fall  . Facial Laceration     (Consider location/radiation/quality/duration/timing/severity/associated sxs/prior Treatment) HPI Comments: Patient with h/o arthritis -- presents with c/o fall while walking on uneven ground. She denies feeling lightheaded or losing consciousness before or after the fall. Patient's daughter is present and she can corroborate this. Patient currently denies any pain other than a mild headache. She sustained laceration above L eye. No neck pain or trouble moving her neck. No blurry vision, vomiting, weakness/numbness/tingling in her arms or her legs. No treatments prior to arrival other than applying pressure. The onset of this condition was acute. The course is constant. Aggravating factors: none. Alleviating factors: none.    The history is provided by the patient and a relative.    Past Medical History  Diagnosis Date  . Arthritis   . Hypertension   . Lichen plano-pilaris   . Complication of anesthesia   . PONV (postoperative nausea and vomiting)   . Fibromyalgia     PT THINKS SHE HAS FIBROMYALGIA  . Rash     RECURRENT  . GERD (gastroesophageal reflux disease)   . Constipation, chronic   . High risk HPV infection 12/2012    Pap smear cytology normal with positive HR HPV   Past Surgical History  Procedure Laterality Date  . Vaginal hysterectomy    . Cataract extraction    . Varicose vein ligation    . Breast surgery      Biopsy-benign  . Pelvic laparoscopy      DL  . Abdominal surgery      Laparotomy  . Oophorectomy      LSO  . Total knee arthroplasty  05/25/2012    Procedure: TOTAL KNEE ARTHROPLASTY;  Surgeon: Gearlean Alf, MD;  Location: [facility];  Service: Orthopedics;  Laterality: Right;   Family History  Problem Relation Age of Onset  . Uterine cancer Mother   .  Hypertension Father   . Hypertension Sister   . Breast cancer Sister     Age 61  . Lung cancer Brother   . Breast cancer Paternal Aunt     Age 70  . Stomach cancer Maternal Uncle   . Lung cancer Paternal Aunt    History  Substance Use Topics  . Smoking status: Former Smoker    Quit date: 05/16/1983  . Smokeless tobacco: Not on file  . Alcohol Use: 5.0 oz/week    10 drink(s) per week     Comment: 1OR 2 GLASSES WINE PER NIGHT   OB History   Grav Para Term Preterm Abortions TAB SAB Ect Mult Living   2 1 1  1     1      Review of Systems  Constitutional: Negative for fatigue.  HENT: Negative for tinnitus.   Eyes: Negative for photophobia, pain and visual disturbance.  Respiratory: Negative for shortness of breath.   Cardiovascular: Negative for chest pain.  Gastrointestinal: Negative for nausea and vomiting.  Musculoskeletal: Negative for back pain, gait problem and neck pain.  Skin: Positive for wound.  Neurological: Positive for headaches. Negative for dizziness, weakness, light-headedness and numbness.  Psychiatric/Behavioral: Negative for confusion and decreased concentration.      Allergies  Other; Demerol; Dilaudid; Macrodantin; Penicillins; Sulfa antibiotics; and Zithromax  Home Medications   Current Outpatient Rx  Name  Route  Sig  Dispense  Refill  . amiloride-hydrochlorothiazide (MODURETIC) 5-50 MG tablet   Oral   Take 0.5 tablets by mouth every evening. Takes 1/2 tablet         . aspirin 325 MG buffered tablet   Oral   Take 325 mg by mouth daily.         . calcium carbonate (TUMS - DOSED IN MG ELEMENTAL CALCIUM) 500 MG chewable tablet   Oral   Chew 1 tablet by mouth 3 (three) times daily.          . clobetasol cream (TEMOVATE) 0.05 %   Topical   Apply topically 2 (two) times daily.   30 g   11   . HYDROcodone-acetaminophen (NORCO/VICODIN) 5-325 MG per tablet   Oral   Take 1-2 tablets by mouth every 4 (four) hours as needed.   80 tablet    0   . olopatadine (PATANOL) 0.1 % ophthalmic solution   Both Eyes   Place 1 drop into both eyes 2 (two) times daily.         [name] omeprazole (PRILOSEC OTC) 20 MG tablet   Oral   Take 20 mg by mouth every other day.         . potassium chloride (KLOR-CON) 20 MEQ packet   Oral   Take 20 mEq by mouth daily.          BP 186/72  Pulse 85  Temp(Src) 97.6 F (36.4 C) (Oral)  Resp 14  SpO2 96% Physical Exam  Nursing note and vitals reviewed. Constitutional: She is oriented to person, place, and time. She appears well-developed and well-nourished.  HENT:  Head: Normocephalic. Head is without raccoon's eyes and without Battle's sign.  Right Ear: Tympanic membrane, external ear and ear canal normal. No hemotympanum.  Left Ear: Tympanic membrane, external ear and ear canal normal. No hemotympanum.  Nose: Nose normal. No nasal septal hematoma.  Mouth/Throat: Uvula is midline, oropharynx is clear and moist and mucous membranes are normal.  3cm linear, hemostatic laceration, appearing clean, above L eyebrow.   Eyes: Conjunctivae, EOM and lids are normal. Pupils are equal, round, and reactive to light. Right eye exhibits no nystagmus. Left eye exhibits no nystagmus.  No visible hyphema noted. No pain with movement of eyes.   Neck: Normal range of motion. Neck supple.  Cardiovascular: Normal rate and regular rhythm.   Pulmonary/Chest: Effort normal and breath sounds normal.  Abdominal: Soft. There is no tenderness.  Musculoskeletal:       Cervical back: She exhibits normal range of motion, no tenderness and no bony tenderness.       Thoracic back: She exhibits no tenderness and no bony tenderness.       Lumbar back: She exhibits no tenderness and no bony tenderness.  Neurological: She is alert and oriented to person, place, and time. She has normal strength and normal reflexes. No cranial nerve deficit or sensory deficit. Coordination normal. GCS eye subscore is 4. GCS verbal subscore is  5. GCS motor subscore is 6.  Skin: Skin is warm and dry.  Psychiatric: She has a normal mood and affect.    ED Course  Procedures (including critical care time) Labs Review Labs Reviewed - No data to display Imaging Review Ct Head Wo Contrast  06/26/2013   CLINICAL DATA:  Fall.  Facial laceration.  EXAM: CT HEAD WITHOUT CONTRAST  TECHNIQUE: Contiguous axial images were obtained from the base of the skull through  the vertex without intravenous contrast.  COMPARISON:  No priors.  FINDINGS: Small amount of left-sided periorbital soft tissue swelling. The left globe and retro-orbital soft tissues are grossly normal in appearance. No acute displaced skull fractures are identified. No acute intracranial abnormality. Specifically, no evidence of acute post-traumatic intracranial hemorrhage, no definite regions of acute/subacute cerebral ischemia, no focal mass, mass effect, hydrocephalus or abnormal intra or extra-axial fluid collections. The visualized paranasal sinuses and mastoids are well pneumatized.  IMPRESSION: 1. Small amount of left periorbital soft tissue swelling without evidence of displaced skull fracture or significant acute traumatic finding to the brain.   Electronically Signed   By: Vinnie Langton M.D.   On: 06/26/2013 17:29    EKG Interpretation   None      4:31 PM Patient seen and examined. Work-up initiated. Will need sutures. D/w Dr. Rogene Houston who will see.    Vital signs reviewed and are as follows: Filed Vitals:   06/26/13 1617  BP: 186/72  Pulse: 85  Temp: 97.6 F (36.4 C)  Resp: 14   CT results neg. Pt and family informed.   LACERATION REPAIR Performed by: Faustino Congress Authorized by: Faustino Congress Consent: Verbal consent obtained. Risks and benefits: risks, benefits and alternatives were discussed Consent given by: patient Patient identity confirmed: provided demographic data Prepped and Draped in normal sterile fashion Wound explored  Laceration  Location: L forehead  Laceration Length: 3cm  No Foreign Bodies seen or palpated  Anesthesia: local infiltration  Local anesthetic: lidocaine 2% with epinephrine  Anesthetic total: 4 ml  Irrigation method: skin scrub with saline Amount of cleaning: standard  Skin closure: 5-0 Ethilon  Number of sutures: 3  Technique: simple interrupted  Patient tolerance: Patient tolerated the procedure well with no immediate complications.  Patient thinks she has had tetanus in past 5 years. Offered update. She would rather check with PCP and update if needed.   Patient counseled on wound care. Patient counseled on need to return or see PCP/urgent care for suture removal in 5 days. Patient was urged to return to the Emergency Department urgently with worsening pain, swelling, expanding erythema especially if it streaks away from the affected area, fever, or if they have any other concerns. Patient verbalized understanding.   Patient was counseled on head injury precautions and symptoms that should indicate their return to the ED.  These include severe worsening headache, vision changes, confusion, loss of consciousness, trouble walking, nausea & vomiting, or weakness/tingling in extremities.      MDM   Final diagnoses:  Facial laceration  Facial contusion  Head injury   Patient with obvious mechanical fall and head injury. CT neg. Laceration repaired. PCP f/u regarding tetanus. She has pain medicine at home.   No dangerous or life-threatening conditions suspected or identified by history, physical exam, and by work-up. No indications for hospitalization identified.      Carlisle Cater, PA-C 06/26/13 [phone]

## 2013-06-26 NOTE — ED Provider Notes (Addendum)
Medical screening examination/treatment/procedure(s) were conducted as a shared visit with non-physician practitioner(s) and myself.  I personally evaluated the patient during the encounter.  EKG Interpretation   None         Mervin Kung, MD 06/26/13 1751   Results for orders placed in visit on 12/17/12  URINE CULTURE      Result Value Ref Range   Colony Count >=100,000 COLONIES/ML     Organism ID, Bacteria PROTEUS MIRABILIS     Organism ID, Bacteria ESCHERICHIA COLI    URINALYSIS W MICROSCOPIC + REFLEX CULTURE      Result Value Ref Range   Color, Urine YELLOW  YELLOW   APPearance CLEAR  CLEAR   Specific Gravity, Urine 1.022  1.005 - 1.030   pH 6.0  5.0 - 8.0   Glucose, UA NEG  NEG mg/dL   Bilirubin Urine NEG  NEG   Ketones, ur NEG  NEG mg/dL   Hgb urine dipstick NEG  NEG   Protein, ur NEG  NEG mg/dL   Urobilinogen, UA 0.2  0.0 - 1.0 mg/dL   Nitrite NEG  NEG   Leukocytes, UA SMALL (*) NEG   Squamous Epithelial / LPF FEW  RARE   Crystals NONE SEEN  NONE SEEN   Casts NONE SEEN  NONE SEEN   WBC, UA 3-6 (*) <3 WBC/hpf   RBC / HPF 0-2  <3 RBC/hpf   Bacteria, UA NONE SEEN  RARE   Ct Head Wo Contrast  06/26/2013   CLINICAL DATA:  Fall.  Facial laceration.  EXAM: CT HEAD WITHOUT CONTRAST  TECHNIQUE: Contiguous axial images were obtained from the base of the skull through the vertex without intravenous contrast.  COMPARISON:  No priors.  FINDINGS: Small amount of left-sided periorbital soft tissue swelling. The left globe and retro-orbital soft tissues are grossly normal in appearance. No acute displaced skull fractures are identified. No acute intracranial abnormality. Specifically, no evidence of acute post-traumatic intracranial hemorrhage, no definite regions of acute/subacute cerebral ischemia, no focal mass, mass effect, hydrocephalus or abnormal intra or extra-axial fluid collections. The visualized paranasal sinuses and mastoids are well pneumatized.  IMPRESSION: 1.  Small amount of left periorbital soft tissue swelling without evidence of displaced skull fracture or significant acute traumatic finding to the brain.   Electronically Signed   By: Vinnie Langton M.D.   On: 06/26/2013 17:29    Patient status post fall no loss of consciousness. Head CT without any acute trauma. Patient laceration repair above the left thigh well done bleeding controlled. Patient nontoxic no acute distress. Patient stable for discharge home. Patient has her structures for wound care and suture removal. The laceration above the left eye measures approximately 4 cm. Also has mild contusion and slight abrasion to the chin area. No other significant injuries.  Mervin Kung, MD 06/26/13 [phone]

## 2013-06-26 NOTE — ED Notes (Signed)
Pt presents with c/o fall and facial laceration. Pt was walking on a concrete walkway and fell onto her face, has an approx 1-1.5 inch laceration above her left eye on her brow bone. Bleeding controlled, some minor swelling to that area as well. Pt denies any LOC when she fell. Hypertensive in triage.

## 2013-06-26 NOTE — ED Notes (Signed)
Patient transported to CT 

## 2013-07-01 DIAGNOSIS — S0180XA Unspecified open wound of other part of head, initial encounter: Secondary | ICD-10-CM | POA: Diagnosis not present

## 2013-08-24 DIAGNOSIS — M5137 Other intervertebral disc degeneration, lumbosacral region: Secondary | ICD-10-CM | POA: Diagnosis not present

## 2013-08-25 DIAGNOSIS — L819 Disorder of pigmentation, unspecified: Secondary | ICD-10-CM | POA: Diagnosis not present

## 2013-08-25 DIAGNOSIS — D1801 Hemangioma of skin and subcutaneous tissue: Secondary | ICD-10-CM | POA: Diagnosis not present

## 2013-08-25 DIAGNOSIS — L439 Lichen planus, unspecified: Secondary | ICD-10-CM | POA: Diagnosis not present

## 2013-08-25 DIAGNOSIS — D239 Other benign neoplasm of skin, unspecified: Secondary | ICD-10-CM | POA: Diagnosis not present

## 2013-08-25 DIAGNOSIS — L821 Other seborrheic keratosis: Secondary | ICD-10-CM | POA: Diagnosis not present

## 2013-09-08 DIAGNOSIS — H2510 Age-related nuclear cataract, unspecified eye: Secondary | ICD-10-CM | POA: Diagnosis not present

## 2013-09-08 DIAGNOSIS — Z961 Presence of intraocular lens: Secondary | ICD-10-CM | POA: Diagnosis not present

## 2013-09-08 DIAGNOSIS — H25019 Cortical age-related cataract, unspecified eye: Secondary | ICD-10-CM | POA: Diagnosis not present

## 2013-09-21 DIAGNOSIS — M47817 Spondylosis without myelopathy or radiculopathy, lumbosacral region: Secondary | ICD-10-CM | POA: Diagnosis not present

## 2013-09-21 DIAGNOSIS — M5137 Other intervertebral disc degeneration, lumbosacral region: Secondary | ICD-10-CM | POA: Diagnosis not present

## 2013-10-12 DIAGNOSIS — E876 Hypokalemia: Secondary | ICD-10-CM | POA: Diagnosis not present

## 2013-10-12 DIAGNOSIS — Z23 Encounter for immunization: Secondary | ICD-10-CM | POA: Diagnosis not present

## 2013-10-12 DIAGNOSIS — I10 Essential (primary) hypertension: Secondary | ICD-10-CM | POA: Diagnosis not present

## 2013-10-12 DIAGNOSIS — M549 Dorsalgia, unspecified: Secondary | ICD-10-CM | POA: Diagnosis not present

## 2013-10-12 DIAGNOSIS — K219 Gastro-esophageal reflux disease without esophagitis: Secondary | ICD-10-CM | POA: Diagnosis not present

## 2013-10-13 DIAGNOSIS — M5137 Other intervertebral disc degeneration, lumbosacral region: Secondary | ICD-10-CM | POA: Diagnosis not present

## 2013-10-13 DIAGNOSIS — M47817 Spondylosis without myelopathy or radiculopathy, lumbosacral region: Secondary | ICD-10-CM | POA: Diagnosis not present

## 2013-10-25 DIAGNOSIS — H903 Sensorineural hearing loss, bilateral: Secondary | ICD-10-CM | POA: Diagnosis not present

## 2013-11-02 IMAGING — CR DG CHEST 2V
2 series · 2 of 2 positions shown · non-contrast
Comparison: None.

CLINICAL DATA: Preop for right total knee replacement,
hypertension, former smoking history

CHEST - 2 VIEW

[w chest pa]
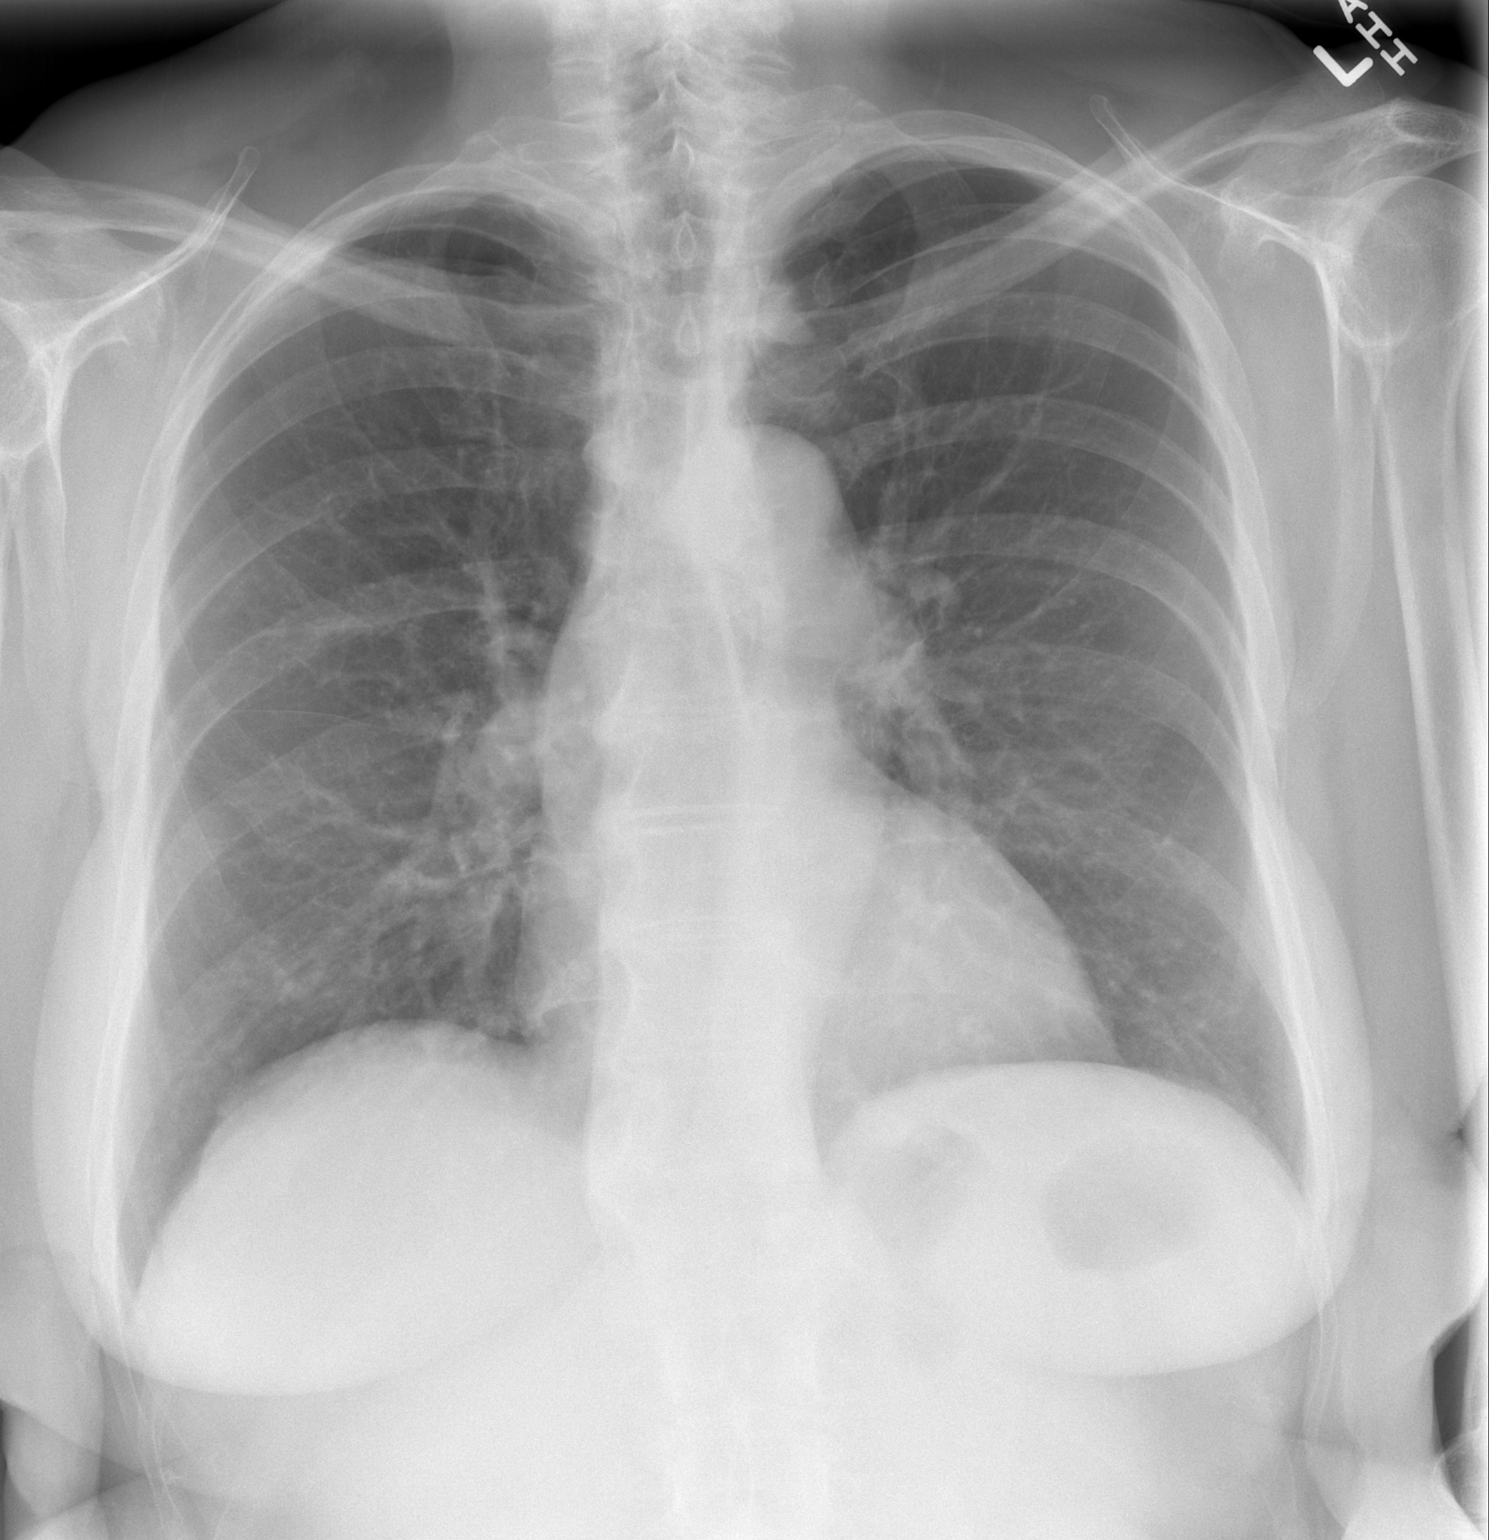

[w chest lat]
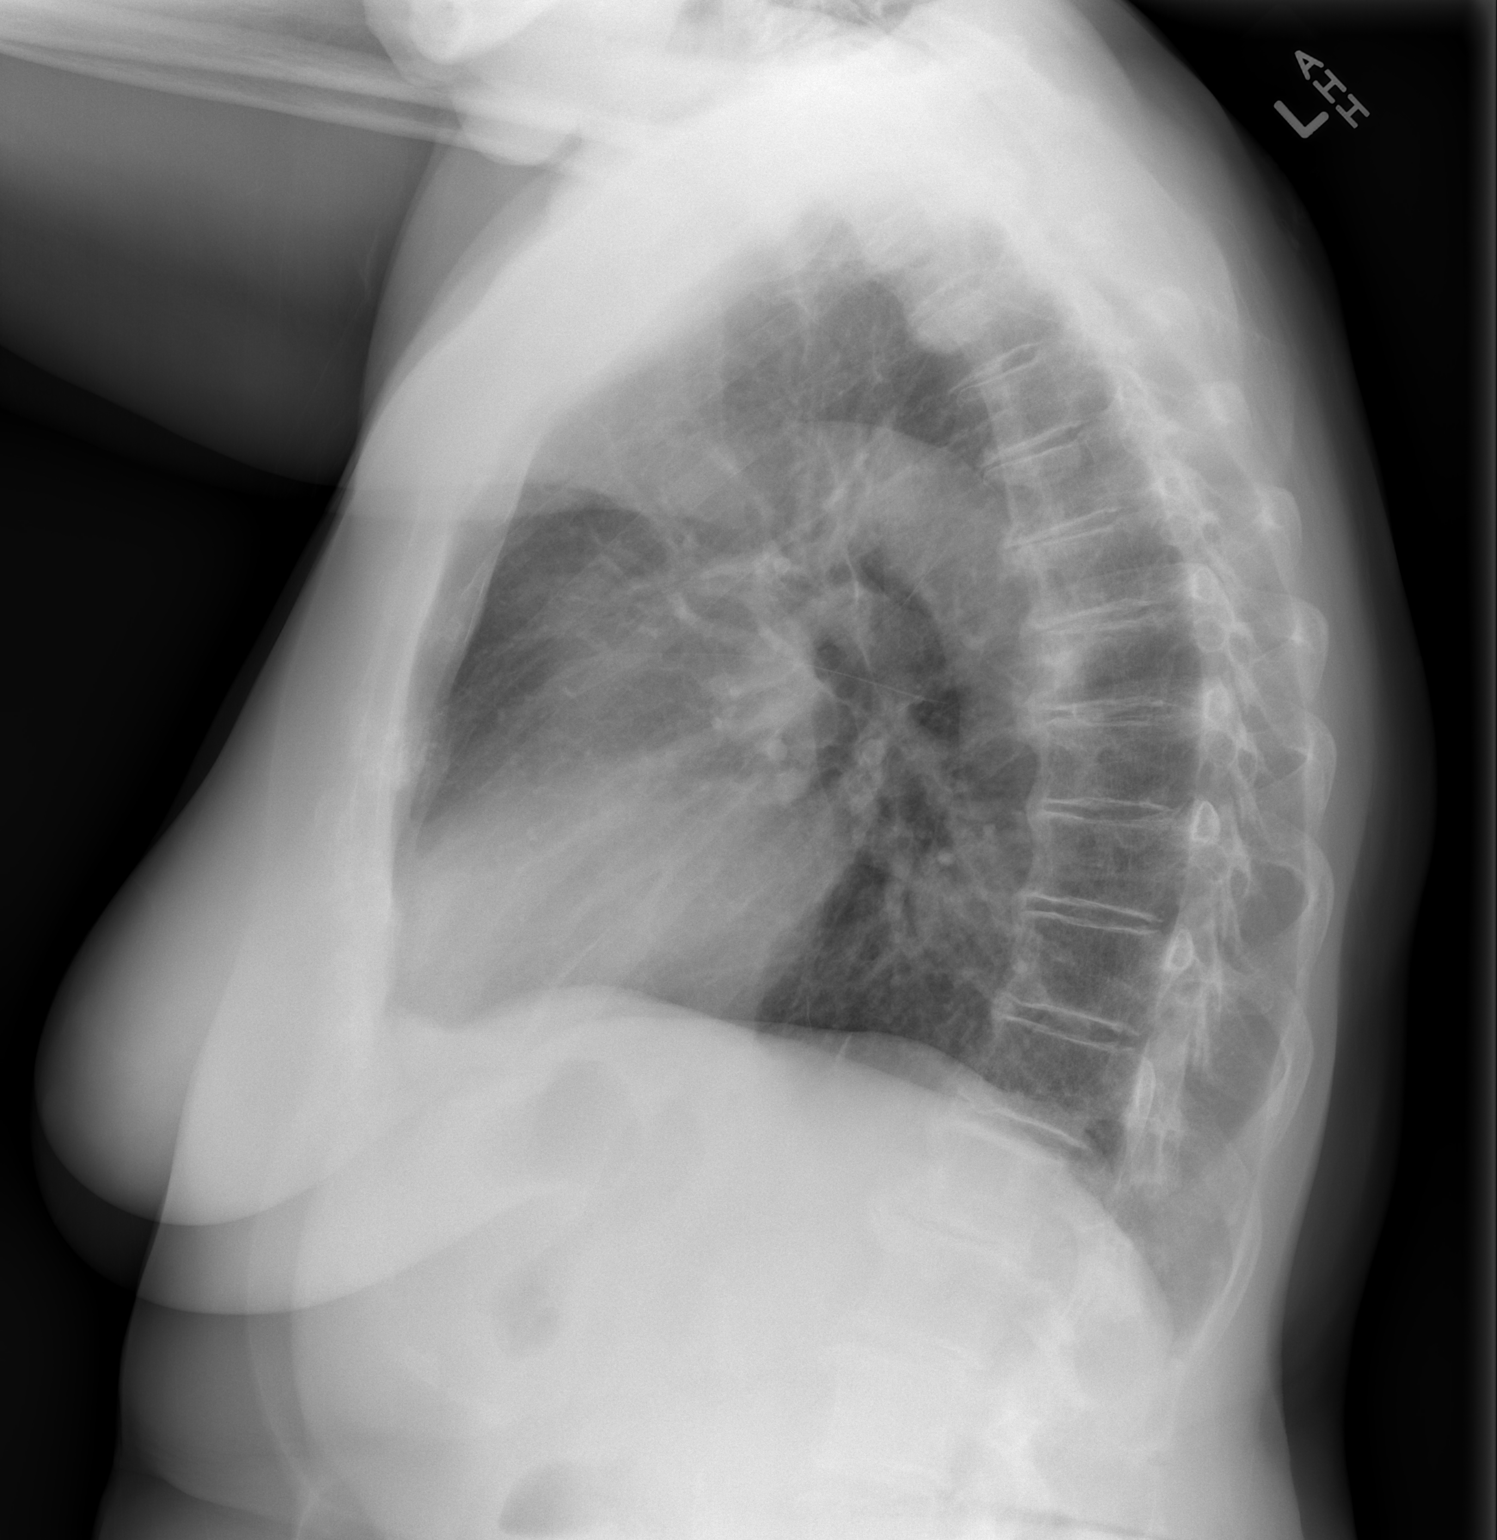

[2 of 2 positions shown; findings below may reference images not displayed]

FINDINGS: No active infiltrate or effusion is seen.  There is a
nodular opacity noted medially in the left upper lung field of
questionable significance.  This may be bony in origin, but
attention to this area on follow-up chest x-ray is recommended.
Mediastinal contours appear normal.  The heart is borderline
enlarged. There are degenerative changes throughout the thoracic
spine.
IMPRESSION: 1.  No active lung disease.
2.  Nodular opacity in the medial left upper lung field of
questionable significance.  Recommend attention to this area on
follow-up chest x-ray.

## 2013-11-08 DIAGNOSIS — M5137 Other intervertebral disc degeneration, lumbosacral region: Secondary | ICD-10-CM | POA: Diagnosis not present

## 2013-11-08 DIAGNOSIS — M5126 Other intervertebral disc displacement, lumbar region: Secondary | ICD-10-CM | POA: Diagnosis not present

## 2013-11-08 DIAGNOSIS — M47817 Spondylosis without myelopathy or radiculopathy, lumbosacral region: Secondary | ICD-10-CM | POA: Diagnosis not present

## 2013-11-12 IMAGING — CR DG CHEST 2V
2 series · 2 of 2 positions shown · non-contrast
Comparison: 05/15/2012

CLINICAL DATA: Preoperative for knee surgery.  Hypertension.
Former smoker.

CHEST - 2 VIEW

[w chest pa]
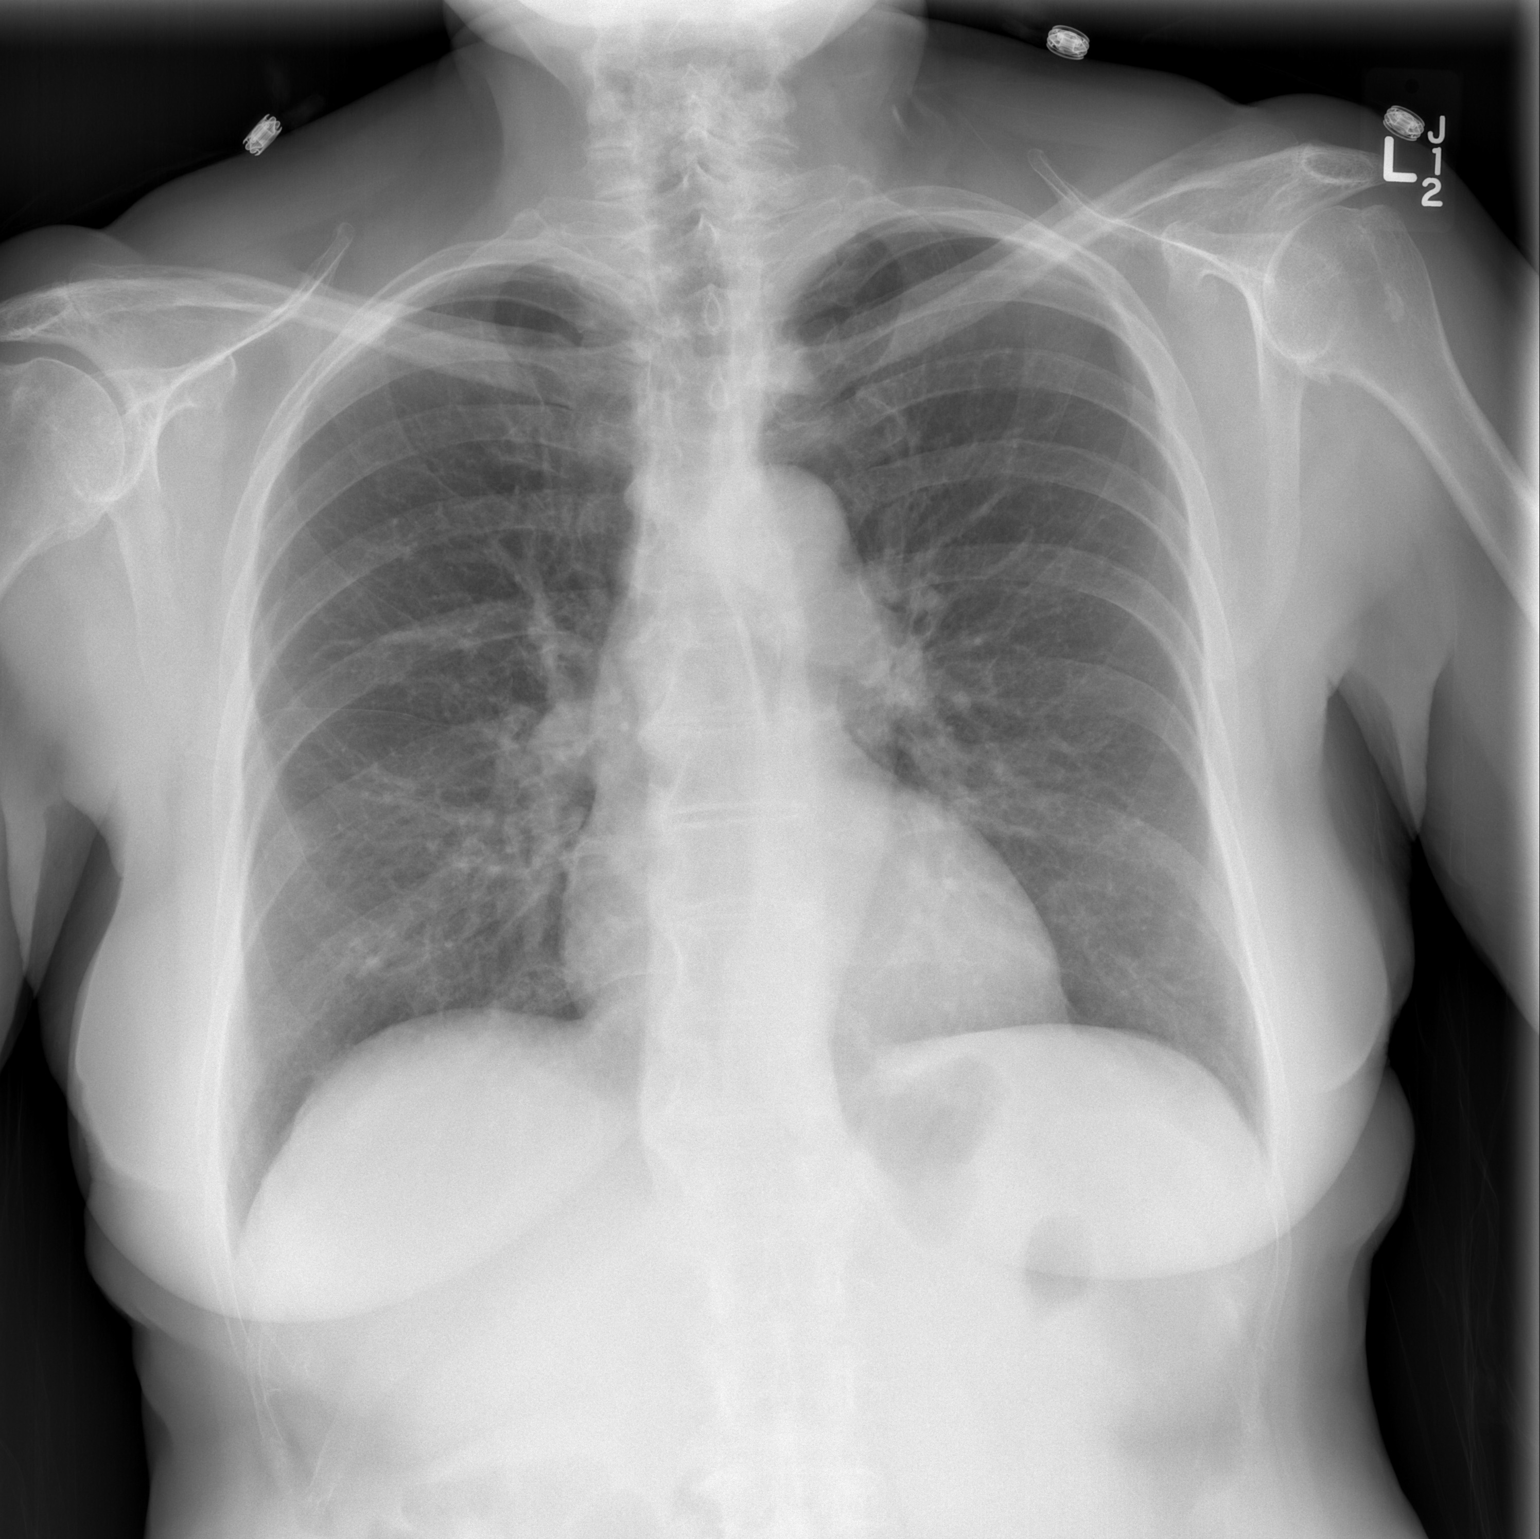

[w chest lat]
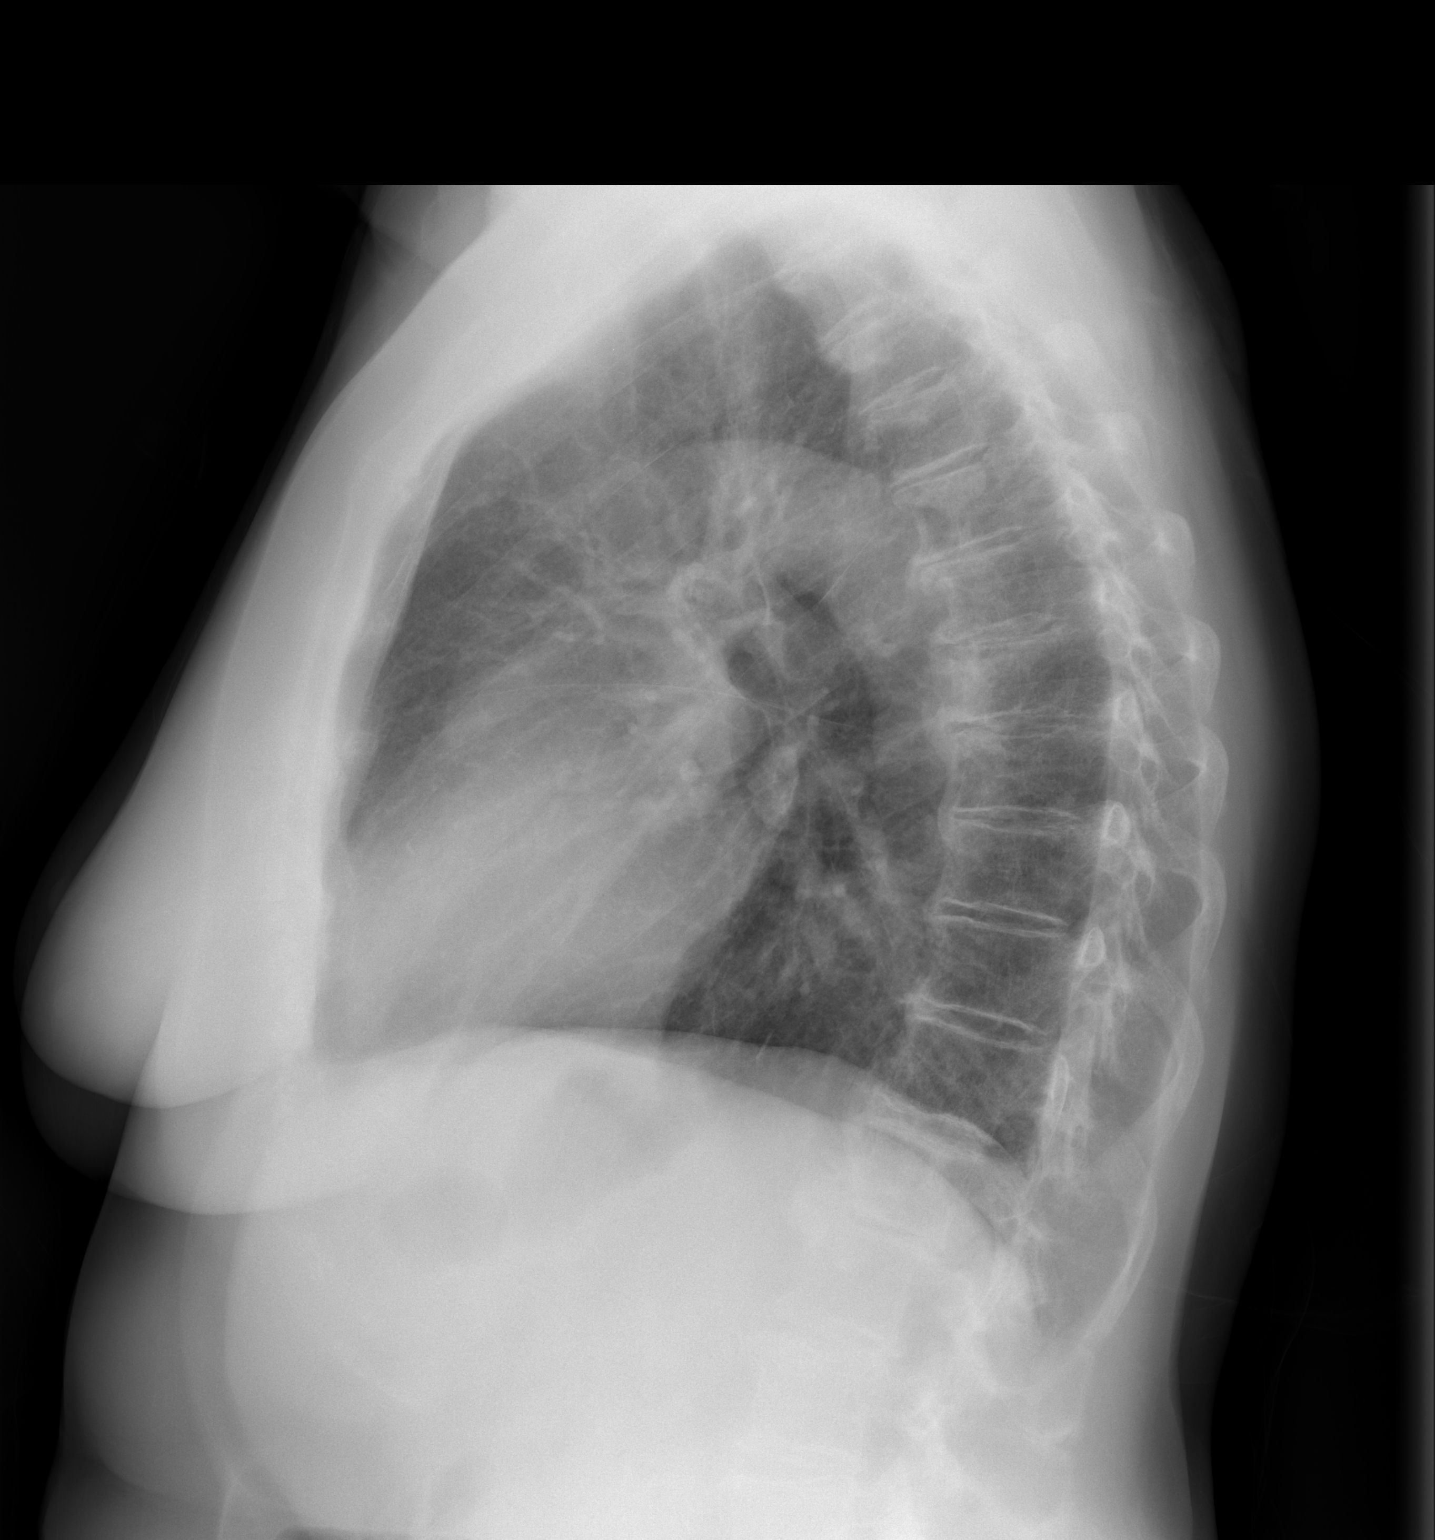

[2 of 2 positions shown; findings below may reference images not displayed]

FINDINGS: The heart size and pulmonary vascularity are normal. The
lungs appear clear and expanded without focal air space disease or
consolidation. No blunting of the costophrenic angles.  Tortuous
aorta.  No pneumothorax.  Mediastinal contours appear intact.
Vague nodular opacity in the left upper lung medially is stable
since previous study.  Degenerative changes in the spine and
shoulders.
IMPRESSION: No evidence of active pulmonary disease.  No significant change
since previous study.

## 2013-12-27 ENCOUNTER — Encounter: Payer: Self-pay | Admitting: Gynecology

## 2013-12-27 ENCOUNTER — Other Ambulatory Visit (HOSPITAL_COMMUNITY)
Admission: RE | Admit: 2013-12-27 | Discharge: 2013-12-27 | Disposition: A | Discharge status: Home / Self Care | Payer: Medicare Other | Source: Ambulatory Visit | Attending: Gynecology | Admitting: Gynecology

## 2013-12-27 ENCOUNTER — Ambulatory Visit (INDEPENDENT_AMBULATORY_CARE_PROVIDER_SITE_OTHER): Payer: Medicare Other | Admitting: Gynecology

## 2013-12-27 VITALS — BP 130/84 | Ht 62.0 in | Wt 164.0 lb

## 2013-12-27 DIAGNOSIS — Z1151 Encounter for screening for human papillomavirus (HPV): Secondary | ICD-10-CM | POA: Diagnosis not present

## 2013-12-27 DIAGNOSIS — N760 Acute vaginitis: Secondary | ICD-10-CM

## 2013-12-27 DIAGNOSIS — Z124 Encounter for screening for malignant neoplasm of cervix: Secondary | ICD-10-CM | POA: Diagnosis not present

## 2013-12-27 DIAGNOSIS — N952 Postmenopausal atrophic vaginitis: Secondary | ICD-10-CM | POA: Diagnosis not present

## 2013-12-27 DIAGNOSIS — N762 Acute vulvitis: Secondary | ICD-10-CM

## 2013-12-27 DIAGNOSIS — R87810 Cervical high risk human papillomavirus (HPV) DNA test positive: Secondary | ICD-10-CM | POA: Diagnosis not present

## 2013-12-27 MED ORDER — CLOBETASOL PROPIONATE 0.05 % EX CREA: TOPICAL_CREAM | Freq: Two times a day (BID) | CUTANEOUS | Status: DC

## 2013-12-27 NOTE — Patient Instructions (Signed)
Followup for Pap smear results.  Followup in one year for annual exam. Sooner if any issues.  You may obtain a copy of any labs that were done today by logging onto MyChart as outlined in the instructions provided with your AVS (after visit summary). The office will not call with normal lab results but certainly if there are any significant abnormalities then we will contact you.   Health Maintenance, Female A healthy lifestyle and preventative care can promote health and wellness.  Maintain regular health, dental, and eye exams.  Eat a healthy diet. Foods like vegetables, fruits, whole grains, low-fat dairy products, and lean protein foods contain the nutrients you need without too many calories. Decrease your intake of foods high in solid fats, added sugars, and salt. Get information about a proper diet from your caregiver, if necessary.  Regular physical exercise is one of the most important things you can do for your health. Most adults should get at least 150 minutes of moderate-intensity exercise (any activity that increases your heart rate and causes you to sweat) each week. In addition, most adults need muscle-strengthening exercises on 2 or more days a week.   Maintain a healthy weight. The body mass index (BMI) is a screening tool to identify possible weight problems. It provides an estimate of body fat based on height and weight. Your caregiver can help determine your BMI, and can help you achieve or maintain a healthy weight. For adults 20 years and older:  A BMI below 18.5 is considered underweight.  A BMI of 18.5 to 24.9 is normal.  A BMI of 25 to 29.9 is considered overweight.  A BMI of 30 and above is considered obese.  Maintain normal blood lipids and cholesterol by exercising and minimizing your intake of saturated fat. Eat a balanced diet with plenty of fruits and vegetables. Blood tests for lipids and cholesterol should begin at age 9 and be repeated every 5 years. If  your lipid or cholesterol levels are high, you are over 50, or you are a high risk for heart disease, you may need your cholesterol levels checked more frequently.Ongoing high lipid and cholesterol levels should be treated with medicines if diet and exercise are not effective.  If you smoke, find out from your caregiver how to quit. If you do not use tobacco, do not start.  Lung cancer screening is recommended for adults aged 46 80 years who are at high risk for developing lung cancer because of a history of smoking. Yearly low-dose computed tomography (CT) is recommended for people who have at least a 30-pack-year history of smoking and are a current smoker or have quit within the past 15 years. A pack year of smoking is smoking an average of 1 pack of cigarettes a day for 1 year (for example: 1 pack a day for 30 years or 2 packs a day for 15 years). Yearly screening should continue until the smoker has stopped smoking for at least 15 years. Yearly screening should also be stopped for people who develop a health problem that would prevent them from having lung cancer treatment.  If you are pregnant, do not drink alcohol. If you are breastfeeding, be very cautious about drinking alcohol. If you are not pregnant and choose to drink alcohol, do not exceed 1 drink per day. One drink is considered to be 12 ounces (355 mL) of beer, 5 ounces (148 mL) of wine, or 1.5 ounces (44 mL) of liquor.  Avoid use of street  drugs. Do not share needles with anyone. Ask for help if you need support or instructions about stopping the use of drugs.  High blood pressure causes heart disease and increases the risk of stroke. Blood pressure should be checked at least every 1 to 2 years. Ongoing high blood pressure should be treated with medicines, if weight loss and exercise are not effective.  If you are 73 to 76 years old, ask your caregiver if you should take aspirin to prevent strokes.  Diabetes screening involves taking  a blood sample to check your fasting blood sugar level. This should be done once every 3 years, after age 70, if you are within normal weight and without risk factors for diabetes. Testing should be considered at a younger age or be carried out more frequently if you are overweight and have at least 1 risk factor for diabetes.  Breast cancer screening is essential preventative care for women. You should practice "breast self-awareness." This means understanding the normal appearance and feel of your breasts and may include breast self-examination. Any changes detected, no matter how small, should be reported to a caregiver. Women in their 2s and 30s should have a clinical breast exam (CBE) by a caregiver as part of a regular health exam every 1 to 3 years. After age 65, women should have a CBE every year. Starting at age 59, women should consider having a mammogram (breast X-ray) every year. Women who have a family history of breast cancer should talk to their caregiver about genetic screening. Women at a high risk of breast cancer should talk to their caregiver about having an MRI and a mammogram every year.  Breast cancer gene (BRCA)-related cancer risk assessment is recommended for women who have family members with BRCA-related cancers. BRCA-related cancers include breast, ovarian, tubal, and peritoneal cancers. Having family members with these cancers may be associated with an increased risk for harmful changes (mutations) in the breast cancer genes BRCA1 and BRCA2. Results of the assessment will determine the need for genetic counseling and BRCA1 and BRCA2 testing.  The Pap test is a screening test for cervical cancer. Women should have a Pap test starting at age 36. Between ages 16 and 54, Pap tests should be repeated every 2 years. Beginning at age 41, you should have a Pap test every 3 years as long as the past 3 Pap tests have been normal. If you had a hysterectomy for a problem that was not cancer  or a condition that could lead to cancer, then you no longer need Pap tests. If you are between ages 16 and 50, and you have had normal Pap tests going back 10 years, you no longer need Pap tests. If you have had past treatment for cervical cancer or a condition that could lead to cancer, you need Pap tests and screening for cancer for at least 20 years after your treatment. If Pap tests have been discontinued, risk factors (such as a new sexual partner) need to be reassessed to determine if screening should be resumed. Some women have medical problems that increase the chance of getting cervical cancer. In these cases, your caregiver may recommend more frequent screening and Pap tests.  The human papillomavirus (HPV) test is an additional test that may be used for cervical cancer screening. The HPV test looks for the virus that can cause the cell changes on the cervix. The cells collected during the Pap test can be tested for HPV. The HPV test could be  used to screen women aged 58 years and older, and should be used in women of any age who have unclear Pap test results. After the age of 73, women should have HPV testing at the same frequency as a Pap test.  Colorectal cancer can be detected and often prevented. Most routine colorectal cancer screening begins at the age of 67 and continues through age 43. However, your caregiver may recommend screening at an earlier age if you have risk factors for colon cancer. On a yearly basis, your caregiver may provide home test kits to check for hidden blood in the stool. Use of a small camera at the end of a tube, to directly examine the colon (sigmoidoscopy or colonoscopy), can detect the earliest forms of colorectal cancer. Talk to your caregiver about this at age 53, when routine screening begins. Direct examination of the colon should be repeated every 5 to 10 years through age 90, unless early forms of pre-cancerous polyps or small growths are found.  Hepatitis C  blood testing is recommended for all people born from [birth year] through 1963 and any individual with known risks for hepatitis C.  Practice safe sex. Use condoms and avoid high-risk sexual practices to reduce the spread of sexually transmitted infections (STIs). Sexually active women aged 29 and younger should be checked for Chlamydia, which is a common sexually transmitted infection. Older women with new or multiple partners should also be tested for Chlamydia. Testing for other STIs is recommended if you are sexually active and at increased risk.  Osteoporosis is a disease in which the bones lose minerals and strength with aging. This can result in serious bone fractures. The risk of osteoporosis can be identified using a bone density scan. Women ages 17 and over and women at risk for fractures or osteoporosis should discuss screening with their caregivers. Ask your caregiver whether you should be taking a calcium supplement or vitamin D to reduce the rate of osteoporosis.  Menopause can be associated with physical symptoms and risks. Hormone replacement therapy is available to decrease symptoms and risks. You should talk to your caregiver about whether hormone replacement therapy is right for you.  Use sunscreen. Apply sunscreen liberally and repeatedly throughout the day. You should seek shade when your shadow is shorter than you. Protect yourself by wearing long sleeves, pants, a wide-brimmed hat, and sunglasses year round, whenever you are outdoors.  Notify your caregiver of new moles or changes in moles, especially if there is a change in shape or color. Also notify your caregiver if a mole is larger than the size of a pencil eraser.  Stay current with your immunizations. Document Released: 11/12/2010 Document Revised: 08/24/2012 Document Reviewed: 11/12/2010 [facility] Patient Information 2014 [place].

## 2013-12-27 NOTE — Progress Notes (Signed)
Crystal Ramsey 1935/09/29 [id number]        78 y.o.  G2P1011 for followup exam. Several issues noted below.  Past medical history,surgical history, problem list, medications, allergies, family history and social history were all reviewed and documented as reviewed in the EPIC chart.  ROS:  12 system ROS performed with pertinent positives and negatives included in the history, assessment and plan.   Additional significant findings :  None   Exam: [name] Vitals:   12/27/13 1444  BP: 130/84  Height: 5\' 2"  (1.575 m)  Weight: 164 lb (74.39 kg)   General appearance:  Normal affect, orientation and appearance. Skin: Grossly normal HEENT: Without gross lesions.  No cervical or supraclavicular adenopathy. Thyroid normal.  Lungs:  Clear without wheezing, rales or rhonchi Cardiac: RR, without RMG Abdominal:  Soft, nontender, without masses, guarding, rebound, organomegaly or hernia Breasts:  Examined lying and sitting without masses, retractions, discharge or axillary adenopathy. Pelvic:  Ext/BUS/vagina with generalized atrophic changes.  Adnexa  Without masses or tenderness    Anus and perineum  Normal   Rectovaginal  Normal sphincter tone without palpated masses or tenderness.    Assessment/Plan:  78 y.o. G51P1011 female for followup exam..   1. Postmenopausal/atrophic genital changes. History of intermittent vulvitis and uses Temovate 0.05% cream occasionally. Patient asked for refill if she thinks her supply is out dated and I refilled her x1 tube with 2 refills. Exam is normal without overt evidence of lichen sclerosis or other chronic irritative conditions. No issues with chronic atrophic changes or dyspareunia. Status post TVH in the past. Will continue to monitor. Followup if Temovate does not control symptoms. 2. History of DES taken during pregnancy. Pap smear last year showed normal cytology but positive high risk HPV negative subtypes 16/18/45. Pap/HPV done  today. 3. Mammography 12/2012. Patient has scheduled and will followup for this. SBE monthly reviewed. 4. DEXA 2007 normal. I again recommended DEXA  now as I did last year and she agrees to schedule it now. Increase calcium/vitamin D reviewed. 5. Colonoscopy 7 years ago with planned repeat at 10 year interval. 6. Health maintenance. Note routine blood work done as this is done at her primary physician's office. Followup for DEXA, otherwise one year, sooner as needed.   Note: This document was prepared with digital dictation and possible smart phrase technology. Any transcriptional errors that result from this process are unintentional.   Anastasio Auerbach MD, 3:39 PM 12/27/2013

## 2013-12-27 NOTE — Addendum Note (Signed)
Addended by: Nelva Nay on: 12/27/2013 04:03 PM   Modules accepted: Orders

## 2013-12-28 DIAGNOSIS — Z803 Family history of malignant neoplasm of breast: Secondary | ICD-10-CM | POA: Diagnosis not present

## 2013-12-28 DIAGNOSIS — Z1231 Encounter for screening mammogram for malignant neoplasm of breast: Secondary | ICD-10-CM | POA: Diagnosis not present

## 2013-12-28 LAB — URINALYSIS W MICROSCOPIC + REFLEX CULTURE
BACTERIA UA: NONE SEEN
BILIRUBIN URINE: NEGATIVE
CRYSTALS: NONE SEEN
Casts: NONE SEEN
Glucose, UA: NEGATIVE mg/dL
Hgb urine dipstick: NEGATIVE
KETONES UR: NEGATIVE mg/dL
Leukocytes, UA: NEGATIVE
Nitrite: NEGATIVE
PROTEIN: NEGATIVE mg/dL
Specific Gravity, Urine: 1.008 (ref 1.005–1.030)
Squamous Epithelial / LPF: NONE SEEN
UROBILINOGEN UA: 0.2 mg/dL (ref 0.0–1.0)
pH: 5.5 (ref 5.0–8.0)

## 2013-12-29 ENCOUNTER — Encounter: Payer: Self-pay | Admitting: Gynecology

## 2013-12-29 DIAGNOSIS — M5137 Other intervertebral disc degeneration, lumbosacral region: Secondary | ICD-10-CM | POA: Diagnosis not present

## 2013-12-29 LAB — CYTOLOGY - PAP

## 2013-12-31 DIAGNOSIS — M5137 Other intervertebral disc degeneration, lumbosacral region: Secondary | ICD-10-CM | POA: Diagnosis not present

## 2013-12-31 DIAGNOSIS — M47817 Spondylosis without myelopathy or radiculopathy, lumbosacral region: Secondary | ICD-10-CM | POA: Diagnosis not present

## 2013-12-31 DIAGNOSIS — M1610 Unilateral primary osteoarthritis, unspecified hip: Secondary | ICD-10-CM | POA: Diagnosis not present

## 2014-01-03 DIAGNOSIS — M5137 Other intervertebral disc degeneration, lumbosacral region: Secondary | ICD-10-CM | POA: Diagnosis not present

## 2014-01-19 DIAGNOSIS — M169 Osteoarthritis of hip, unspecified: Secondary | ICD-10-CM | POA: Diagnosis not present

## 2014-01-19 DIAGNOSIS — M1610 Unilateral primary osteoarthritis, unspecified hip: Secondary | ICD-10-CM | POA: Diagnosis not present

## 2014-01-24 ENCOUNTER — Other Ambulatory Visit: Payer: Self-pay | Admitting: Gynecology

## 2014-01-24 ENCOUNTER — Ambulatory Visit (INDEPENDENT_AMBULATORY_CARE_PROVIDER_SITE_OTHER): Payer: Medicare Other

## 2014-01-24 DIAGNOSIS — Z780 Asymptomatic menopausal state: Secondary | ICD-10-CM

## 2014-01-24 DIAGNOSIS — N762 Acute vulvitis: Secondary | ICD-10-CM

## 2014-01-24 DIAGNOSIS — N760 Acute vaginitis: Secondary | ICD-10-CM | POA: Diagnosis not present

## 2014-01-25 DIAGNOSIS — M5137 Other intervertebral disc degeneration, lumbosacral region: Secondary | ICD-10-CM | POA: Diagnosis not present

## 2014-02-01 DIAGNOSIS — M5137 Other intervertebral disc degeneration, lumbosacral region: Secondary | ICD-10-CM | POA: Diagnosis not present

## 2014-02-08 DIAGNOSIS — M5137 Other intervertebral disc degeneration, lumbosacral region: Secondary | ICD-10-CM | POA: Diagnosis not present

## 2014-02-18 DIAGNOSIS — M5136 Other intervertebral disc degeneration, lumbar region: Secondary | ICD-10-CM | POA: Diagnosis not present

## 2014-02-23 DIAGNOSIS — M7071 Other bursitis of hip, right hip: Secondary | ICD-10-CM | POA: Diagnosis not present

## 2014-02-23 DIAGNOSIS — M7072 Other bursitis of hip, left hip: Secondary | ICD-10-CM | POA: Diagnosis not present

## 2014-03-03 DIAGNOSIS — Z23 Encounter for immunization: Secondary | ICD-10-CM | POA: Diagnosis not present

## 2014-03-14 ENCOUNTER — Encounter: Payer: Self-pay | Admitting: Gynecology

## 2014-04-14 DIAGNOSIS — E559 Vitamin D deficiency, unspecified: Secondary | ICD-10-CM | POA: Diagnosis not present

## 2014-04-14 DIAGNOSIS — K219 Gastro-esophageal reflux disease without esophagitis: Secondary | ICD-10-CM | POA: Diagnosis not present

## 2014-04-14 DIAGNOSIS — Z1389 Encounter for screening for other disorder: Secondary | ICD-10-CM | POA: Diagnosis not present

## 2014-04-14 DIAGNOSIS — I10 Essential (primary) hypertension: Secondary | ICD-10-CM | POA: Diagnosis not present

## 2014-06-02 DIAGNOSIS — M1612 Unilateral primary osteoarthritis, left hip: Secondary | ICD-10-CM | POA: Diagnosis not present

## 2014-06-02 DIAGNOSIS — Z96651 Presence of right artificial knee joint: Secondary | ICD-10-CM | POA: Diagnosis not present

## 2014-06-02 DIAGNOSIS — Z471 Aftercare following joint replacement surgery: Secondary | ICD-10-CM | POA: Diagnosis not present

## 2014-06-02 DIAGNOSIS — M7071 Other bursitis of hip, right hip: Secondary | ICD-10-CM | POA: Diagnosis not present

## 2014-06-15 DIAGNOSIS — M1612 Unilateral primary osteoarthritis, left hip: Secondary | ICD-10-CM | POA: Diagnosis not present

## 2014-08-25 ENCOUNTER — Ambulatory Visit: Payer: Self-pay | Admitting: Orthopedic Surgery

## 2014-08-25 NOTE — Progress Notes (Signed)
Please put orders in Epic surgery 09-07-14 pre op 08-31-14 Thanks

## 2014-08-25 NOTE — Progress Notes (Signed)
Preoperative surgical orders have been place into the Epic hospital system for Crystal Ramsey on 08/25/2014, 6:00 PM  by Mickel Crow for surgery on 09-07-2014.  Preop Total Hip - Anterior Approach orders including IV Tylenol, and IV Decadron as long as there are no contraindications to the above medications. [name], PA-C

## 2014-08-30 NOTE — Patient Instructions (Addendum)
YOUR PROCEDURE IS SCHEDULED ON :  09/07/14  REPORT TO [facility] FOLLOW SIGNS TO SHORT STAY CENTER AT :  12:30 PM  CALL THIS NUMBER IF YOU HAVE PROBLEMS THE MORNING OF SURGERY [phone]  REMEMBER:  DO NOT EAT FOOD  AFTER MIDNIGHT  MAY HAVE CLEAR LIQUIDS UNTIL 8:30 AM  TAKE THESE MEDICINES THE MORNING OF SURGERY: OMEPRAZOLE / HYDROCODONE  CLEAR LIQUID DIET   Foods Allowed                                                                     Foods Excluded  Coffee and tea, regular and decaf                             liquids that you cannot  Plain Jell-O in any flavor                                             see through such as: Fruit ices (not with fruit pulp)                                     milk, soups, orange juice  Iced Popsicles                                                 All solid food Carbonated beverages, regular and diet                                    Cranberry, grape and apple juices Sports drinks like Gatorade Lightly seasoned clear broth or consume(fat free) Sugar, honey syrup  _____________________________________________________________________    YOU MAY NOT HAVE ANY METAL ON YOUR BODY INCLUDING HAIR PINS AND PIERCING'S. DO NOT WEAR JEWELRY, MAKEUP, LOTIONS, POWDERS OR PERFUMES. DO NOT WEAR NAIL POLISH. DO NOT SHAVE 48 HRS PRIOR TO SURGERY. MEN MAY SHAVE FACE AND NECK.  DO NOT [place]. [facility] IS NOT RESPONSIBLE FOR VALUABLES.  CONTACTS, DENTURES OR PARTIALS MAY NOT BE WORN TO SURGERY. LEAVE SUITCASE IN CAR. CAN BE BROUGHT TO ROOM AFTER SURGERY.  PATIENTS DISCHARGED THE DAY OF SURGERY WILL NOT BE ALLOWED TO DRIVE HOME.  PLEASE READ OVER THE FOLLOWING INSTRUCTION SHEETS _________________________________________________________________________________            Crystal Ramsey THE NIGHT BEFORE WITH ANTIBACTERIAL SOAP AND THE DAY OF SURGERY  PATIENT  SIGNATURE_________________________________  ______________________________________________________________________     Crystal Ramsey  An incentive spirometer is a tool that can help keep your lungs clear and active. This tool measures how well you are filling your lungs with each breath. Taking long deep breaths may help reverse or decrease the chance of developing breathing (pulmonary) problems (especially infection) following:  A long period of time when you are  unable to move or be active. BEFORE THE PROCEDURE   If the spirometer includes an indicator to show your best effort, your nurse or respiratory therapist will set it to a desired goal.  If possible, sit up straight or lean slightly forward. Try not to slouch.  Hold the incentive spirometer in an upright position. INSTRUCTIONS FOR USE   Sit on the edge of your bed if possible, or sit up as far as you can in bed or on a chair.  Hold the incentive spirometer in an upright position.  Breathe out normally.  Place the mouthpiece in your mouth and seal your lips tightly around it.  Breathe in slowly and as deeply as possible, raising the piston or the ball toward the top of the column.  Hold your breath for 3-5 seconds or for as long as possible. Allow the piston or ball to fall to the bottom of the column.  Remove the mouthpiece from your mouth and breathe out normally.  Rest for a few seconds and repeat Steps 1 through 7 at least 10 times every 1-2 hours when you are awake. Take your time and take a few normal breaths between deep breaths.  The spirometer may include an indicator to show your best effort. Use the indicator as a goal to work toward during each repetition.  After each set of 10 deep breaths, practice coughing to be sure your lungs are clear. If you have an incision (the cut made at the time of surgery), support your incision when coughing by placing a pillow or rolled up towels firmly against  it. Once you are able to get out of bed, walk around indoors and cough well. You may stop using the incentive spirometer when instructed by your caregiver.  RISKS AND COMPLICATIONS  Take your time so you do not get dizzy or light-headed.  If you are in pain, you may need to take or ask for pain medication before doing incentive spirometry. It is harder to take a deep breath if you are having pain. AFTER USE  Rest and breathe slowly and easily.  It can be helpful to keep track of a log of your progress. Your caregiver can provide you with a simple table to help with this. If you are using the spirometer at home, follow these instructions: [place] IF:   You are having difficultly using the spirometer.  You have trouble using the spirometer as often as instructed.  Your pain medication is not giving enough relief while using the spirometer.  You develop fever of 100.5 F (38.1 C) or higher. SEEK IMMEDIATE MEDICAL CARE IF:   You cough up bloody sputum that had not been present before.  You develop fever of 102 F (38.9 C) or greater.  You develop worsening pain at or near the incision site. MAKE SURE YOU:   Understand these instructions.  Will watch your condition.  Will get help right away if you are not doing well or get worse. Document Released: 09/09/2006 Document Revised: 07/22/2011 Document Reviewed: 11/10/2006 [employer] Patient Information 2014 [employer], [place].   ________________________________________________________________________  WHAT IS A BLOOD TRANSFUSION? Blood Transfusion Information  A transfusion is the replacement of blood or some of its parts. Blood is made up of multiple cells which provide different functions.  Red blood cells carry oxygen and are used for blood loss replacement.  White blood cells fight against infection.  Platelets control bleeding.  Plasma helps clot blood.  Other blood products are  available for specialized  needs, such as hemophilia or other clotting disorders. BEFORE THE TRANSFUSION  Who gives blood for transfusions?   Healthy volunteers who are fully evaluated to make sure their blood is safe. This is blood bank blood. Transfusion therapy is the safest it has ever been in the practice of medicine. Before blood is taken from a donor, a complete history is taken to make sure that person has no history of diseases nor engages in risky social behavior (examples are intravenous drug use or sexual activity with multiple partners). The donor's travel history is screened to minimize risk of transmitting infections, such as malaria. The donated blood is tested for signs of infectious diseases, such as HIV and hepatitis. The blood is then tested to be sure it is compatible with you in order to minimize the chance of a transfusion reaction. If you or a relative donates blood, this is often done in anticipation of surgery and is not appropriate for emergency situations. It takes many days to process the donated blood. RISKS AND COMPLICATIONS Although transfusion therapy is very safe and saves many lives, the main dangers of transfusion include:   Getting an infectious disease.  Developing a transfusion reaction. This is an allergic reaction to something in the blood you were given. Every precaution is taken to prevent this. The decision to have a blood transfusion has been considered carefully by your caregiver before blood is given. Blood is not given unless the benefits outweigh the risks. AFTER THE TRANSFUSION  Right after receiving a blood transfusion, you will usually feel much better and more energetic. This is especially true if your red blood cells have gotten low (anemic). The transfusion raises the level of the red blood cells which carry oxygen, and this usually causes an energy increase.  The nurse administering the transfusion will monitor you carefully for complications. HOME CARE INSTRUCTIONS   No special instructions are needed after a transfusion. You may find your energy is better. Speak with your caregiver about any limitations on activity for underlying diseases you may have. SEEK MEDICAL CARE IF:   Your condition is not improving after your transfusion.  You develop redness or irritation at the intravenous (IV) site. SEEK IMMEDIATE MEDICAL CARE IF:  Any of the following symptoms occur over the next 12 hours:  Shaking chills.  You have a temperature by mouth above 102 F (38.9 C), not controlled by medicine.  Chest, back, or muscle pain.  People around you feel you are not acting correctly or are confused.  Shortness of breath or difficulty breathing.  Dizziness and fainting.  You get a rash or develop hives.  You have a decrease in urine output.  Your urine turns a dark color or changes to pink, red, or brown. Any of the following symptoms occur over the next 10 days:  You have a temperature by mouth above 102 F (38.9 C), not controlled by medicine.  Shortness of breath.  Weakness after normal activity.  The white part of the eye turns yellow (jaundice).  You have a decrease in the amount of urine or are urinating less often.  Your urine turns a dark color or changes to pink, red, or brown. Document Released: 04/26/2000 Document Revised: 07/22/2011 Document Reviewed: 12/14/2007 [facility] Patient Information 2014 [place], [place].  _______________________________________________________________________

## 2014-08-31 ENCOUNTER — Encounter (HOSPITAL_COMMUNITY): Payer: Self-pay

## 2014-08-31 ENCOUNTER — Encounter (HOSPITAL_COMMUNITY)
Admission: RE | Admit: 2014-08-31 | Discharge: 2014-08-31 | Disposition: A | Discharge status: Home / Self Care | Payer: Medicare Other | Source: Ambulatory Visit | Attending: Orthopedic Surgery | Admitting: Orthopedic Surgery

## 2014-08-31 DIAGNOSIS — Z01810 Encounter for preprocedural cardiovascular examination: Secondary | ICD-10-CM | POA: Insufficient documentation

## 2014-08-31 DIAGNOSIS — Z01812 Encounter for preprocedural laboratory examination: Secondary | ICD-10-CM | POA: Diagnosis not present

## 2014-08-31 LAB — URINE MICROSCOPIC-ADD ON

## 2014-08-31 LAB — COMPREHENSIVE METABOLIC PANEL
ALK PHOS: 85 U/L (ref 39–117)
ALT: 18 U/L (ref 0–35)
AST: 22 U/L (ref 0–37)
Albumin: 4.2 g/dL (ref 3.5–5.2)
Anion gap: 9 (ref 5–15)
BUN: 17 mg/dL (ref 6–23)
CHLORIDE: 97 mmol/L (ref 96–112)
CO2: 31 mmol/L (ref 19–32)
CREATININE: 0.65 mg/dL (ref 0.50–1.10)
Calcium: 9.2 mg/dL (ref 8.4–10.5)
GFR calc Af Amer: >90 mL/min (ref >90)
GFR, EST NON AFRICAN AMERICAN: 84 mL/min — AB (ref >90)
Glucose, Bld: 104 mg/dL — ABNORMAL HIGH (ref 70–99)
Potassium: 3.6 mmol/L (ref 3.5–5.1)
Sodium: 137 mmol/L (ref 135–145)
Total Bilirubin: 0.4 mg/dL (ref 0.3–1.2)
Total Protein: 7.2 g/dL (ref 6.0–8.3)

## 2014-08-31 LAB — PROTIME-INR
INR: 1.02 (ref 0.00–1.49)
Prothrombin Time: 13.5 seconds (ref 11.6–15.2)

## 2014-08-31 LAB — URINALYSIS, ROUTINE W REFLEX MICROSCOPIC
Bilirubin Urine: NEGATIVE
Glucose, UA: NEGATIVE mg/dL
Hgb urine dipstick: NEGATIVE
Ketones, ur: NEGATIVE mg/dL
NITRITE: NEGATIVE
PROTEIN: NEGATIVE mg/dL
SPECIFIC GRAVITY, URINE: 1.014 (ref 1.005–1.030)
Urobilinogen, UA: 0.2 mg/dL (ref 0.0–1.0)
pH: 6 (ref 5.0–8.0)

## 2014-08-31 LAB — CBC
HCT: 40.9 % (ref 36.0–46.0)
HEMOGLOBIN: 13.4 g/dL (ref 12.0–15.0)
MCH: 30.7 pg (ref 26.0–34.0)
MCHC: 32.8 g/dL (ref 30.0–36.0)
MCV: 93.8 fL (ref 78.0–100.0)
Platelets: 280 10*3/uL (ref 150–400)
RBC: 4.36 MIL/uL (ref 3.87–5.11)
RDW: 13.5 % (ref 11.5–15.5)
WBC: 4.9 10*3/uL (ref 4.0–10.5)

## 2014-08-31 LAB — APTT: APTT: 33 s (ref 24–37)

## 2014-08-31 LAB — SURGICAL PCR SCREEN
MRSA, PCR: NEGATIVE
Staphylococcus aureus: NEGATIVE

## 2014-08-31 NOTE — Progress Notes (Signed)
   08/31/14 1426  OBSTRUCTIVE SLEEP APNEA  Have you ever been diagnosed with sleep apnea through a sleep study? No  Do you snore loudly (loud enough to be heard through closed doors)?  1  Do you often feel tired, fatigued, or sleepy during the daytime? 1  Has anyone observed you stop breathing during your sleep? 1 (HX OF "GASPING NOISE")  Do you have, or are you being treated for high blood pressure? 1  BMI more than 35 kg/m2? 0  Age over 77 years old? 1  Neck circumference greater than 40 cm/16 inches? 0  Gender: 0

## 2014-09-04 ENCOUNTER — Ambulatory Visit: Payer: Self-pay | Admitting: Orthopedic Surgery

## 2014-09-04 NOTE — H&P (Signed)
Crystal Ramsey DOB: May 08, 1936 Married / Language: English / Race: White Female Date of Admission:  09/07/2014 CC:  Right Hip Pain History of Present Illness The patient is a 79 year old female who comes in for a preoperative History and Physical. The patient is scheduled for a right total hip arthroplasty (anterior) to be performed by Dr. Dione Plover. Aluisio, MD at [facility] on 09-28-2014. The patient comes in today 2 years out from right total knee arthroplasty. The patient states that she is doing fair at this time. The patient feels that they are progressing well at this time. She has had some lateral hip pain and wonders if the hip pain is making the knee hurt. She is requesting a refill on the Hydrocodone. The patient was also being followed for their right hip pain and trochanteric bursitis. They are 3 month(s) out from the last injection by Dr. Delilah Shan. The patient feels that they are doing poorly and report their pain level to be mild to moderate. Current treatment includes: relative rest. The patient has reported improvement of their symptoms with: Cortisone injections. Note for "Follow-up Hip": The hip has been painful for about 2-3 months. She gets a little soreness anteriorly in the thigh. Most of her pain is in the groin and buttock area. It radiates down to her knee. She has had an intraarticular hip injection and a lateral trochanteric injection and did get some benefit for a very short amount of time. She feels like she is losing more motion in her right hip. It is getting harder for her to do things. She is concerned that she may have a problem with the knee, which is causing this. I told her that the problems in her leg are coming from the hip and not from the knee. AP pelvis and lateral of the right hip shows severe bone on bone arthritis of the right hip, but large osteophyte formation and no joint space left. There is also some subchondral cystic change. She also has  some degenerative change in the left hip, but to a much lesser degree. AP and lateral of her right knee shows her prosthesis to be in excellent position with no periprosthetic abnormalities.She was glad to know at least we had a source of the problems. I told her that the most predictable means of improving her pain and function at this point would be a total hip arthroplasty. She would like to go ahead and get the hip replaced. We discussed that in detail today and she will go ahead and proceed with the surgery. They have been treated conservatively in the past for the above stated problem and despite conservative measures, they continue to have progressive pain and severe functional limitations and dysfunction. They have failed non-operative management including home exercise, medications, and injections. It is felt that they would benefit from undergoing total joint replacement. Risks and benefits of the procedure have been discussed with the patient and they elect to proceed with surgery. There are no active contraindications to surgery such as ongoing infection or rapidly progressive neurological disease.  Problem List/Past Medical  Primary osteoarthritis of left hip (M16.12) Osteoarthritis of right hip (M16.11) Osteoarthritis, Hip (715.35) bil Chronic Pain Autoimmune disorder Varicose veins Osteoarthritis Gastroesophageal Reflux Disease High blood pressure Fibromyalgia Cataract Impaired Hearing Depression Mumps Menopause Displacement, lumbar disc w/o myelopathy (722.10) (M51.26)08/15/1992 Bronchitis Urinary Tract Infection Degenerative Disc Disease Scarlet Fever Measles Streptococcus Infections Fibroadenoma Breast Disease  Allergies  Chlorhexidine Gluconate *Antiseptics & Disinfectants**  chlorascrub Demerol *ANALGESICS - OPIOID* Nausea, Vomiting. Zithromax *MACROLIDES* Dyes eye exam dyes Penicillins young adulthood reaction - does not  remember Dilaudid *ANALGESICS - OPIOID* Nausea, Vomiting. Sulfa Drugs unknown reaction - does not remember  Family History  Hypertension father and sister First Degree Relatives Osteoporosis sister Osteoarthritis father, sister, brother and grandmother mothers side Cancer First Degree Relatives. mother, sister and brother  Social History  Current work status retired Engineer, agricultural (Currently) no Illicit drug use no Tobacco use Former smoker. Alcohol use current drinker; drinks wine; 2-5 oz. Marital status married Previously in rehab no Exercise Exercises rarely Children 1 Post-Surgical Plans Plan is to look into skilled rehab facility. Living situation live with spouse Advance Directives Living Will, Healthcare POA Tobacco / smoke exposure yes outdoors only Number of flights of stairs before winded less than 1 Pain Contract no  Medication History Tylenol Extra Strength (500MG  Tablet, Oral) Active. Cipro (500MG  Tablet, Oral) Active. (1 talbet before dental appt. and 12 hours after) Hydrocodone-Acetaminophen (5-325MG  Tablet, Oral) Active. (1/2 tablet twice a day) PriLOSEC (20MG  Capsule DR, Oral) Active. (every other day) Tums (Oral) Specific dose unknown - Active. (3 500mg  tablets per day) Senna (Oral) Active. Aspirin Buffered (325MG  Tablet, Oral) Active. Clobetasol Propionate (0.05% Cream, External as needed) Active. Amiloride-Hydrochlorothiazide (5-50MG  Tablet, Oral) Active. (1/2 tablet)  Past Surgical History Dilation and Curettage of Uterus Cataract Surgery left Tonsillectomy Hysterectomy partial (non-cancerous) Breast Biopsy right Total Knee Replacement - Right Date: 05/25/2012.  Review of Systems (Gwynevere Lizana L. Charman Blasco III PA-C; 08/18/2014 3:46 PM) General Not Present- Chills, Fatigue, Fever, Memory Loss, Night Sweats, Weight Gain and Weight Loss. Skin Not Present- Eczema, Hives, Itching, Lesions and Rash. HEENT Present-  Hearing Loss. Not Present- Dentures, Double Vision, Headache, Tinnitus and Visual Loss. Respiratory Not Present- Allergies, Chronic Cough, Coughing up blood, Shortness of breath at rest and Shortness of breath with exertion. Cardiovascular Not Present- Chest Pain, Difficulty Breathing Lying Down, Murmur, Palpitations, Racing/skipping heartbeats and Swelling. Gastrointestinal Present- Heartburn. Not Present- Abdominal Pain, Bloody Stool, Constipation, Diarrhea, Difficulty Swallowing, Jaundice, Loss of appetitie, Nausea and Vomiting. Female Genitourinary Present- Urinary Retention and Urinating at Night. Not Present- Blood in Urine, Discharge, Flank Pain, Incontinence, Painful Urination, Urgency, Urinary frequency and Weak urinary stream. Musculoskeletal Present- Back Pain, Joint Pain, Morning Stiffness and Muscle Weakness. Not Present- Joint Swelling, Muscle Pain and Spasms. Neurological Not Present- Blackout spells, Difficulty with balance, Dizziness, Paralysis, Tremor and Weakness. Psychiatric Not Present- Insomnia.  Vitals Weight: 162 lb Height: 63in Weight was reported by patient. Height was reported by patient. Body Surface Area: 1.77 m Body Mass Index: 28.7 kg/m  BP: 188/92 (Sitting, Right Arm, Standard)  Physical Exam General Mental Status -Alert, cooperative and good historian. General Appearance-pleasant, Not in acute distress. Orientation-Oriented X3. Build & Nutrition-Well nourished and Well developed.  Head and Neck Head-normocephalic, atraumatic . Neck Global Assessment - supple, no bruit auscultated on the right, no bruit auscultated on the left.  Eye Vision-Wears corrective lenses. Pupil - Bilateral-Regular and Round. Motion - Bilateral-EOMI.  Chest and Lung Exam Auscultation Breath sounds - clear at anterior chest wall and clear at posterior chest wall. Adventitious sounds - No Adventitious sounds.  Cardiovascular Auscultation Rhythm -  Regular rate and rhythm. Heart Sounds - S1 WNL and S2 WNL. Murmurs & Other Heart Sounds - Auscultation of the heart reveals - No Murmurs.  Abdomen Palpation/Percussion Tenderness - Abdomen is non-tender to palpation. Rigidity (guarding) - Abdomen is soft. Auscultation Auscultation of the abdomen reveals - Bowel sounds  normal.  Female Genitourinary Note: Not done, not pertinent to present illness   Musculoskeletal Note: On exam, she is alert and oriented and in no apparent distress. Her right hip could be flexed to 90 with no internal rotation, only about 5 degrees of external rotation and 5 degrees of abduction. Left hip flexion is 110 degrees, rotate in 10, out 20, abduction 20 without discomfort. Her right knee shows no effusion. Her range of motion in her right knee is about 0 to 122 degrees. There is no instability noted.  RADIOGRAPHS: AP pelvis and lateral of the right hip shows severe bone on bone arthritis of the right hip, but large osteophyte formation and no joint space left. There is also some subchondral cystic change. She also has some degenerative change in the left hip, but to a much lesser degree. AP and lateral of her right knee shows her prosthesis to be in excellent position with no periprosthetic abnormalities.   Assessment & Plan Osteoarthritis of right hip (M16.11)  Note:Surgical Plans: Right Total Hip Replacement - Anterior Approach  Disposition: Home  PCP: Dr. Lavone Orn - Patient has been seen preoperatively and felt to be stable for surgery.  IV TXA  Anesthesia Issues: NONE  Signed electronically by Joelene Millin, III PA-C

## 2014-09-07 ENCOUNTER — Inpatient Hospital Stay (HOSPITAL_COMMUNITY): Payer: Medicare Other

## 2014-09-07 ENCOUNTER — Encounter (HOSPITAL_COMMUNITY)
Admission: RE | Disposition: A | Discharge status: Home Health | Payer: Self-pay | Source: Ambulatory Visit | Attending: Orthopedic Surgery

## 2014-09-07 ENCOUNTER — Inpatient Hospital Stay (HOSPITAL_COMMUNITY): Payer: Medicare Other | Admitting: Anesthesiology

## 2014-09-07 ENCOUNTER — Inpatient Hospital Stay (HOSPITAL_COMMUNITY)
Admission: RE | Admit: 2014-09-07 | Discharge: 2014-09-10 | DRG: 470 | Disposition: A | Discharge status: Home Health | Payer: Medicare Other | Source: Ambulatory Visit | Attending: Orthopedic Surgery | Admitting: Orthopedic Surgery

## 2014-09-07 ENCOUNTER — Encounter (HOSPITAL_COMMUNITY): Payer: Self-pay | Admitting: *Deleted

## 2014-09-07 DIAGNOSIS — Z8249 Family history of ischemic heart disease and other diseases of the circulatory system: Secondary | ICD-10-CM | POA: Diagnosis not present

## 2014-09-07 DIAGNOSIS — M7061 Trochanteric bursitis, right hip: Secondary | ICD-10-CM | POA: Diagnosis present

## 2014-09-07 DIAGNOSIS — H9190 Unspecified hearing loss, unspecified ear: Secondary | ICD-10-CM | POA: Diagnosis present

## 2014-09-07 DIAGNOSIS — Z01812 Encounter for preprocedural laboratory examination: Secondary | ICD-10-CM

## 2014-09-07 DIAGNOSIS — Z87891 Personal history of nicotine dependence: Secondary | ICD-10-CM

## 2014-09-07 DIAGNOSIS — M1611 Unilateral primary osteoarthritis, right hip: Secondary | ICD-10-CM | POA: Diagnosis present

## 2014-09-07 DIAGNOSIS — M549 Dorsalgia, unspecified: Secondary | ICD-10-CM | POA: Diagnosis not present

## 2014-09-07 DIAGNOSIS — M25551 Pain in right hip: Secondary | ICD-10-CM | POA: Diagnosis not present

## 2014-09-07 DIAGNOSIS — K5900 Constipation, unspecified: Secondary | ICD-10-CM | POA: Diagnosis present

## 2014-09-07 DIAGNOSIS — M169 Osteoarthritis of hip, unspecified: Secondary | ICD-10-CM | POA: Diagnosis not present

## 2014-09-07 DIAGNOSIS — Z96651 Presence of right artificial knee joint: Secondary | ICD-10-CM | POA: Diagnosis present

## 2014-09-07 DIAGNOSIS — K219 Gastro-esophageal reflux disease without esophagitis: Secondary | ICD-10-CM | POA: Diagnosis present

## 2014-09-07 DIAGNOSIS — I10 Essential (primary) hypertension: Secondary | ICD-10-CM | POA: Diagnosis present

## 2014-09-07 DIAGNOSIS — Z471 Aftercare following joint replacement surgery: Secondary | ICD-10-CM | POA: Diagnosis not present

## 2014-09-07 DIAGNOSIS — Z7982 Long term (current) use of aspirin: Secondary | ICD-10-CM | POA: Diagnosis not present

## 2014-09-07 DIAGNOSIS — Z96649 Presence of unspecified artificial hip joint: Secondary | ICD-10-CM

## 2014-09-07 DIAGNOSIS — Z96642 Presence of left artificial hip joint: Secondary | ICD-10-CM | POA: Diagnosis not present

## 2014-09-07 DIAGNOSIS — M797 Fibromyalgia: Secondary | ICD-10-CM | POA: Diagnosis not present

## 2014-09-07 HISTORY — PX: TOTAL HIP ARTHROPLASTY: SHX124

## 2014-09-07 LAB — TYPE AND SCREEN
ABO/RH(D): O POS
Antibody Screen: NEGATIVE

## 2014-09-07 SURGERY — ARTHROPLASTY, HIP, TOTAL, ANTERIOR APPROACH
Anesthesia: Spinal | Site: Hip | Laterality: Right

## 2014-09-07 MED ORDER — PHENOL 1.4 % MT LIQD: 1.0000 | OROMUCOSAL | Status: DC | PRN

## 2014-09-07 MED ORDER — ONDANSETRON HCL 4 MG PO TABS: 4.0000 mg | ORAL_TABLET | Freq: Four times a day (QID) | ORAL | Status: DC | PRN

## 2014-09-07 MED ORDER — DIPHENHYDRAMINE HCL 12.5 MG/5ML PO ELIX
12.5000 mg | ORAL_SOLUTION | ORAL | Status: DC | PRN
  Administered 2014-09-07: 12.5 mg via ORAL
  Administered 2014-09-08: 25 mg via ORAL
  Filled 2014-09-07: qty 10

## 2014-09-07 MED ORDER — DEXAMETHASONE SODIUM PHOSPHATE 10 MG/ML IJ SOLN
10.0000 mg | Freq: Once | INTRAMUSCULAR | Status: AC
  Administered 2014-09-07: 10 mg via INTRAVENOUS

## 2014-09-07 MED ORDER — METHOCARBAMOL 500 MG PO TABS: 500.0000 mg | ORAL_TABLET | Freq: Four times a day (QID) | ORAL | Status: DC | PRN

## 2014-09-07 MED ORDER — OMEPRAZOLE 20 MG PO CPDR
20.0000 mg | DELAYED_RELEASE_CAPSULE | ORAL | Status: DC
Start: 2014-09-09 — End: 2014-09-10
  Administered 2014-09-09: 20 mg via ORAL
  Filled 2014-09-07: qty 1

## 2014-09-07 MED ORDER — LACTATED RINGERS IV SOLN
INTRAVENOUS | Status: DC
  Administered 2014-09-07 (×3): via INTRAVENOUS

## 2014-09-07 MED ORDER — FENTANYL CITRATE (PF) 100 MCG/2ML IJ SOLN
INTRAMUSCULAR | Status: DC | PRN
  Administered 2014-09-07: 50 ug via INTRAVENOUS

## 2014-09-07 MED ORDER — PROPOFOL 10 MG/ML IV BOLUS
INTRAVENOUS | Status: AC
  Filled 2014-09-07: qty 20

## 2014-09-07 MED ORDER — ACETAMINOPHEN 500 MG PO TABS
1000.0000 mg | ORAL_TABLET | Freq: Four times a day (QID) | ORAL | Status: AC
  Filled 2014-09-07 (×3): qty 2

## 2014-09-07 MED ORDER — METHOCARBAMOL 1000 MG/10ML IJ SOLN
500.0000 mg | Freq: Four times a day (QID) | INTRAMUSCULAR | Status: DC | PRN
  Filled 2014-09-07: qty 5

## 2014-09-07 MED ORDER — MIDAZOLAM HCL 2 MG/2ML IJ SOLN
INTRAMUSCULAR | Status: AC
  Filled 2014-09-07: qty 2

## 2014-09-07 MED ORDER — ACETAMINOPHEN 10 MG/ML IV SOLN
1000.0000 mg | Freq: Once | INTRAVENOUS | Status: AC
  Administered 2014-09-07: 1000 mg via INTRAVENOUS
  Filled 2014-09-07: qty 100

## 2014-09-07 MED ORDER — FENTANYL CITRATE (PF) 100 MCG/2ML IJ SOLN
INTRAMUSCULAR | Status: AC
  Filled 2014-09-07: qty 2

## 2014-09-07 MED ORDER — OXYCODONE HCL 5 MG PO TABS
5.0000 mg | ORAL_TABLET | ORAL | Status: DC | PRN
  Filled 2014-09-07: qty 1

## 2014-09-07 MED ORDER — MENTHOL 3 MG MT LOZG: 1.0000 | LOZENGE | OROMUCOSAL | Status: DC | PRN

## 2014-09-07 MED ORDER — CHLORHEXIDINE GLUCONATE 4 % EX LIQD: 60.0000 mL | Freq: Once | CUTANEOUS | Status: DC

## 2014-09-07 MED ORDER — 0.9 % SODIUM CHLORIDE (POUR BTL) OPTIME
TOPICAL | Status: DC | PRN
  Administered 2014-09-07: 1000 mL

## 2014-09-07 MED ORDER — PROPOFOL 10 MG/ML IV BOLUS
INTRAVENOUS | Status: DC | PRN
  Administered 2014-09-07: 30 mg via INTRAVENOUS

## 2014-09-07 MED ORDER — FLEET ENEMA 7-19 GM/118ML RE ENEM: 1.0000 | ENEMA | Freq: Once | RECTAL | Status: AC | PRN

## 2014-09-07 MED ORDER — RIVAROXABAN 10 MG PO TABS
10.0000 mg | ORAL_TABLET | Freq: Every day | ORAL | Status: DC
  Administered 2014-09-08 – 2014-09-10 (×3): 10 mg via ORAL
  Filled 2014-09-07 (×4): qty 1

## 2014-09-07 MED ORDER — DEXAMETHASONE SODIUM PHOSPHATE 10 MG/ML IJ SOLN
10.0000 mg | Freq: Once | INTRAMUSCULAR | Status: AC
  Administered 2014-09-08: 10 mg via INTRAVENOUS
  Filled 2014-09-07: qty 1

## 2014-09-07 MED ORDER — TRAMADOL HCL 50 MG PO TABS: 50.0000 mg | ORAL_TABLET | Freq: Four times a day (QID) | ORAL | Status: DC | PRN

## 2014-09-07 MED ORDER — BUPIVACAINE HCL (PF) 0.25 % IJ SOLN
INTRAMUSCULAR | Status: DC | PRN
  Administered 2014-09-07: 30 mL

## 2014-09-07 MED ORDER — HYDROCODONE-ACETAMINOPHEN 7.5-325 MG PO TABS
1.0000 | ORAL_TABLET | ORAL | Status: DC | PRN
  Administered 2014-09-07 – 2014-09-10 (×13): 1 via ORAL
  Filled 2014-09-07 (×14): qty 1
  Filled 2014-09-07: qty 2

## 2014-09-07 MED ORDER — OXYCODONE HCL 5 MG PO TABS
5.0000 mg | ORAL_TABLET | ORAL | Status: DC | PRN
  Administered 2014-09-07: 10 mg via ORAL
  Filled 2014-09-07: qty 2

## 2014-09-07 MED ORDER — DEXAMETHASONE SODIUM PHOSPHATE 10 MG/ML IJ SOLN
INTRAMUSCULAR | Status: AC
  Filled 2014-09-07: qty 1

## 2014-09-07 MED ORDER — DOCUSATE SODIUM 100 MG PO CAPS
100.0000 mg | ORAL_CAPSULE | Freq: Two times a day (BID) | ORAL | Status: DC
  Administered 2014-09-07 – 2014-09-09 (×3): 100 mg via ORAL

## 2014-09-07 MED ORDER — SODIUM CHLORIDE 0.9 % IV SOLN: INTRAVENOUS | Status: DC

## 2014-09-07 MED ORDER — BUPIVACAINE HCL (PF) 0.25 % IJ SOLN
INTRAMUSCULAR | Status: AC
  Filled 2014-09-07: qty 30

## 2014-09-07 MED ORDER — ACETAMINOPHEN 650 MG RE SUPP: 650.0000 mg | Freq: Four times a day (QID) | RECTAL | Status: DC | PRN

## 2014-09-07 MED ORDER — ONDANSETRON HCL 4 MG/2ML IJ SOLN: 4.0000 mg | Freq: Four times a day (QID) | INTRAMUSCULAR | Status: DC | PRN

## 2014-09-07 MED ORDER — ONDANSETRON HCL 4 MG/2ML IJ SOLN
INTRAMUSCULAR | Status: DC | PRN
  Administered 2014-09-07: 4 mg via INTRAVENOUS

## 2014-09-07 MED ORDER — BISACODYL 10 MG RE SUPP: 10.0000 mg | Freq: Every day | RECTAL | Status: DC | PRN

## 2014-09-07 MED ORDER — METOCLOPRAMIDE HCL 10 MG PO TABS: 5.0000 mg | ORAL_TABLET | Freq: Three times a day (TID) | ORAL | Status: DC | PRN

## 2014-09-07 MED ORDER — KETOROLAC TROMETHAMINE 15 MG/ML IJ SOLN: 7.5000 mg | Freq: Four times a day (QID) | INTRAMUSCULAR | Status: AC | PRN

## 2014-09-07 MED ORDER — VANCOMYCIN HCL IN DEXTROSE 1-5 GM/200ML-% IV SOLN
1000.0000 mg | INTRAVENOUS | Status: AC
Start: 2014-09-08 — End: 2014-09-07
  Administered 2014-09-07: 1000 mg via INTRAVENOUS
  Filled 2014-09-07: qty 200

## 2014-09-07 MED ORDER — FENTANYL CITRATE (PF) 100 MCG/2ML IJ SOLN: 25.0000 ug | INTRAMUSCULAR | Status: DC | PRN

## 2014-09-07 MED ORDER — SODIUM CHLORIDE 0.9 % IV SOLN
1000.0000 mg | INTRAVENOUS | Status: AC
  Administered 2014-09-07: 1000 mg via INTRAVENOUS
  Filled 2014-09-07: qty 10

## 2014-09-07 MED ORDER — VANCOMYCIN HCL IN DEXTROSE 1-5 GM/200ML-% IV SOLN
1000.0000 mg | Freq: Two times a day (BID) | INTRAVENOUS | Status: AC
  Administered 2014-09-08: 1000 mg via INTRAVENOUS
  Filled 2014-09-07: qty 200

## 2014-09-07 MED ORDER — MIDAZOLAM HCL 5 MG/5ML IJ SOLN
INTRAMUSCULAR | Status: DC | PRN
  Administered 2014-09-07: 2 mg via INTRAVENOUS

## 2014-09-07 MED ORDER — ACETAMINOPHEN 325 MG PO TABS: 650.0000 mg | ORAL_TABLET | Freq: Four times a day (QID) | ORAL | Status: DC | PRN

## 2014-09-07 MED ORDER — METOCLOPRAMIDE HCL 5 MG/ML IJ SOLN: 5.0000 mg | Freq: Three times a day (TID) | INTRAMUSCULAR | Status: DC | PRN

## 2014-09-07 MED ORDER — PHENYLEPHRINE HCL 10 MG/ML IJ SOLN
INTRAMUSCULAR | Status: DC | PRN
  Administered 2014-09-07: 80 ug via INTRAVENOUS

## 2014-09-07 MED ORDER — MORPHINE SULFATE 2 MG/ML IJ SOLN: 1.0000 mg | INTRAMUSCULAR | Status: DC | PRN

## 2014-09-07 MED ORDER — POLYETHYLENE GLYCOL 3350 17 G PO PACK
17.0000 g | PACK | Freq: Every day | ORAL | Status: DC | PRN
  Administered 2014-09-09 – 2014-09-10 (×2): 17 g via ORAL
  Filled 2014-09-07 (×2): qty 1

## 2014-09-07 MED ORDER — PHENYLEPHRINE 40 MCG/ML (10ML) SYRINGE FOR IV PUSH (FOR BLOOD PRESSURE SUPPORT)
PREFILLED_SYRINGE | INTRAVENOUS | Status: AC
  Filled 2014-09-07: qty 10

## 2014-09-07 MED ORDER — ONDANSETRON HCL 4 MG/2ML IJ SOLN
INTRAMUSCULAR | Status: AC
  Filled 2014-09-07: qty 2

## 2014-09-07 MED ORDER — POTASSIUM CHLORIDE IN NACL 20-0.9 MEQ/L-% IV SOLN
INTRAVENOUS | Status: DC
  Administered 2014-09-07: 19:00:00 via INTRAVENOUS
  Filled 2014-09-07 (×2): qty 1000

## 2014-09-07 MED ORDER — PROPOFOL INFUSION 10 MG/ML OPTIME
INTRAVENOUS | Status: DC | PRN
  Administered 2014-09-07: 180 ug/kg/min via INTRAVENOUS

## 2014-09-07 MED ORDER — TRIAMTERENE-HCTZ 75-50 MG PO TABS
1.0000 | ORAL_TABLET | ORAL | Status: DC
  Administered 2014-09-08 – 2014-09-10 (×2): 1 via ORAL
  Filled 2014-09-07 (×2): qty 1

## 2014-09-07 MED ORDER — LIDOCAINE HCL (CARDIAC) 20 MG/ML IV SOLN
INTRAVENOUS | Status: AC
  Filled 2014-09-07: qty 5

## 2014-09-07 MED ORDER — LACTATED RINGERS IV SOLN: INTRAVENOUS | Status: DC

## 2014-09-07 SURGICAL SUPPLY — 43 items
BAG DECANTER FOR FLEXI CONT (MISCELLANEOUS) ×3 IMPLANT
BAG SPEC THK2 15X12 ZIP CLS (MISCELLANEOUS)
BAG ZIPLOCK 12X15 (MISCELLANEOUS) IMPLANT
BLADE EXTENDED COATED 6.5IN (ELECTRODE) ×3 IMPLANT
BLADE SAG 18X100X1.27 (BLADE) ×3 IMPLANT
CAPT HIP TOTAL 2 ×2 IMPLANT
CLOSURE WOUND 1/2 X4 (GAUZE/BANDAGES/DRESSINGS) ×1
COVER PERINEAL POST (MISCELLANEOUS) ×3 IMPLANT
DECANTER SPIKE VIAL GLASS SM (MISCELLANEOUS) ×3 IMPLANT
DRAPE C-ARM 42X120 X-RAY (DRAPES) ×3 IMPLANT
DRAPE STERI IOBAN 125X83 (DRAPES) ×3 IMPLANT
DRAPE U-SHAPE 47X51 STRL (DRAPES) ×9 IMPLANT
DRSG ADAPTIC 3X8 NADH LF (GAUZE/BANDAGES/DRESSINGS) ×3 IMPLANT
DRSG MEPILEX BORDER 4X4 (GAUZE/BANDAGES/DRESSINGS) ×3 IMPLANT
DRSG MEPILEX BORDER 4X8 (GAUZE/BANDAGES/DRESSINGS) ×3 IMPLANT
DURAPREP 26ML APPLICATOR (WOUND CARE) ×3 IMPLANT
ELECT REM PT RETURN 9FT ADLT (ELECTROSURGICAL) ×3
ELECTRODE REM PT RTRN 9FT ADLT (ELECTROSURGICAL) ×1 IMPLANT
EVACUATOR 1/8 PVC DRAIN (DRAIN) ×3 IMPLANT
FACESHIELD WRAPAROUND (MASK) ×12 IMPLANT
FACESHIELD WRAPAROUND OR TEAM (MASK) ×4 IMPLANT
GLOVE BIO SURGEON STRL SZ7.5 (GLOVE) ×3 IMPLANT
GLOVE BIO SURGEON STRL SZ8 (GLOVE) ×6 IMPLANT
GLOVE BIOGEL PI IND STRL 8 (GLOVE) ×2 IMPLANT
GLOVE BIOGEL PI INDICATOR 8 (GLOVE) ×4
GOWN STRL REUS W/TWL LRG LVL3 (GOWN DISPOSABLE) ×3 IMPLANT
GOWN STRL REUS W/TWL XL LVL3 (GOWN DISPOSABLE) ×3 IMPLANT
KIT BASIN OR (CUSTOM PROCEDURE TRAY) ×3 IMPLANT
NDL SAFETY ECLIPSE 18X1.5 (NEEDLE) ×1 IMPLANT
NEEDLE HYPO 18GX1.5 SHARP (NEEDLE) ×3
PACK TOTAL JOINT (CUSTOM PROCEDURE TRAY) ×3 IMPLANT
PEN SKIN MARKING BROAD (MISCELLANEOUS) ×3 IMPLANT
STRIP CLOSURE SKIN 1/2X4 (GAUZE/BANDAGES/DRESSINGS) ×2 IMPLANT
SUT ETHIBOND NAB CT1 #1 30IN (SUTURE) ×3 IMPLANT
SUT MNCRL AB 4-0 PS2 18 (SUTURE) ×3 IMPLANT
SUT VIC AB 2-0 CT1 27 (SUTURE) ×6
SUT VIC AB 2-0 CT1 TAPERPNT 27 (SUTURE) ×2 IMPLANT
SUT VLOC 180 0 24IN GS25 (SUTURE) ×3 IMPLANT
SYR 30ML LL (SYRINGE) ×3 IMPLANT
SYR 50ML LL SCALE MARK (SYRINGE) IMPLANT
TOWEL OR 17X26 10 PK STRL BLUE (TOWEL DISPOSABLE) ×3 IMPLANT
TOWEL OR NON WOVEN STRL DISP B (DISPOSABLE) IMPLANT
TRAY FOLEY W/METER SILVER 14FR (SET/KITS/TRAYS/PACK) ×3 IMPLANT

## 2014-09-07 NOTE — Progress Notes (Signed)
Portable AP Pelvis X-ray done. 

## 2014-09-07 NOTE — Plan of Care (Signed)
Problem: Consults Goal: Diagnosis- Total Joint Replacement Primary Total Hip     

## 2014-09-07 NOTE — Addendum Note (Signed)
Addendum  created 09/07/14 1801 by Bailey Mech, CRNA   Modules edited: Anesthesia Attestations

## 2014-09-07 NOTE — Anesthesia Postprocedure Evaluation (Signed)
  Anesthesia Post-op Note  Patient: Crystal Ramsey  Procedure(s) Performed: Procedure(s) (LRB): RIGHT TOTAL HIP ARTHROPLASTY ANTERIOR APPROACH (Right)  Patient Location: PACU  Anesthesia Type: Spinal  Level of Consciousness: awake and alert   Airway and Oxygen Therapy: Patient Spontanous Breathing  Post-op Pain: mild  Post-op Assessment: Post-op Vital signs reviewed, Patient's Cardiovascular Status Stable, Respiratory Function Stable, Patent Airway and No signs of Nausea or vomiting  Last Vitals:  Filed Vitals:   09/07/14 1730  BP: 171/70  Pulse: 59  Temp:   Resp: 12    Post-op Vital Signs: stable   Complications: No apparent anesthesia complications

## 2014-09-07 NOTE — Op Note (Signed)
OPERATIVE REPORT  PREOPERATIVE DIAGNOSIS: Osteoarthritis of the Right hip.   POSTOPERATIVE DIAGNOSIS: Osteoarthritis of the Right  hip.   PROCEDURE: Right total hip arthroplasty, anterior approach.   SURGEON: Gaynelle Arabian, MD   ASSISTANT: [name], PA-C  ANESTHESIA:  Spinal  ESTIMATED BLOOD LOSS:-100 ml    DRAINS: Hemovac x1.   COMPLICATIONS: None   CONDITION: PACU - hemodynamically stable.   BRIEF CLINICAL NOTE: Crystal Ramsey is a 79 y.o. female who has advanced end-  stage arthritis of her Right  hip with progressively worsening pain and  dysfunction.The patient has failed nonoperative management and presents for  total hip arthroplasty.   PROCEDURE IN DETAIL: After successful administration of spinal  anesthetic, the traction boots for the [facility] bed were placed on both  feet and the patient was placed onto the [facility] bed, boots placed into the leg  holders. The Right hip was then isolated from the perineum with plastic  drapes and prepped and draped in the usual sterile fashion. ASIS and  greater trochanter were marked and a oblique incision was made, starting  at about 1 cm lateral and 2 cm distal to the ASIS and coursing towards  the anterior cortex of the femur. The skin was cut with a 10 blade  through subcutaneous tissue to the level of the fascia overlying the  tensor fascia lata muscle. The fascia was then incised in line with the  incision at the junction of the anterior third and posterior 2/3rd. The  muscle was teased off the fascia and then the interval between the TFL  and the rectus was developed. The Hohmann retractor was then placed at  the top of the femoral neck over the capsule. The vessels overlying the  capsule were cauterized and the fat on top of the capsule was removed.  A Hohmann retractor was then placed anterior underneath the rectus  femoris to give exposure to the entire anterior capsule. A T-shaped  capsulotomy was  performed. The edges were tagged and the femoral head  was identified.       Osteophytes are removed off the superior acetabulum.  The femoral neck was then cut in situ with an oscillating saw. Traction  was then applied to the left lower extremity utilizing the [facility]  traction. The femoral head was then removed. Retractors were placed  around the acetabulum and then circumferential removal of the labrum was  performed. Osteophytes were also removed. Reaming starts at 45 mm to  medialize and  Increased in 2 mm increments to 49 mm. We reamed in  approximately 40 degrees of abduction, 20 degrees anteversion. A 50 mm  pinnacle acetabular shell was then impacted in anatomic position under  fluoroscopic guidance with excellent purchase. We did not need to place  any additional dome screws. A 32 mm neutral + 4 marathon liner was then  placed into the acetabular shell.       The femoral lift was then placed along the lateral aspect of the femur  just distal to the vastus ridge. The leg was  externally rotated and capsule  was stripped off the inferior aspect of the femoral neck down to the  level of the lesser trochanter, this was done with electrocautery. The femur was lifted after this was performed. The  leg was then placed and extended in adducted position to essentially delivering the femur. We also removed the capsule superiorly and the  piriformis from the  piriformis fossa to gain excellent exposure of the  proximal femur. Rongeur was used to remove some cancellous bone to get  into the lateral portion of the proximal femur for placement of the  initial starter reamer. The starter broaches was placed  the starter broach  and was shown to go down the center of the canal. Broaching  with the  Corail system was then performed starting at size 8, coursing  Up to size 10. A size 10 had excellent torsional and rotational  and axial stability. The trial high offset neck was then placed  with a 32 +  1 trial head. The hip was then reduced. We confirmed that  the stem was in the canal both on AP and lateral x-rays. It also has excellent sizing. The hip was reduced with outstanding stability through full extension, full external rotation,  and then flexion in adduction internal rotation. AP pelvis was taken  and the leg lengths were measured and found to be exactly equal. Hip  was then dislocated again and the femoral head and neck removed. The  femoral broach was removed. Size 10 Corail stem with a high offset  neck was then impacted into the femur following native anteversion. Has  excellent purchase in the canal. Excellent torsional and rotational and  axial stability. It is confirmed to be in the canal on AP and lateral  fluoroscopic views. The 32 + 1 ceramic head was placed and the hip  reduced with outstanding stability. Again AP pelvis was taken and it  confirmed that the leg lengths were equal. The wound was then copiously  irrigated with saline solution and the capsule reattached and repaired  with Ethibond suture.  20 mL of Exparel mixed with 50 mL of saline then additional 20 ml of .25% Bupivicaine injected into the capsule and into the edge of the tensor fascia lata as well as subcutaneous tissue. The fascia overlying the tensor fascia lata was  then closed with a running #1 V-Loc. Subcu was closed with interrupted  2-0 Vicryl and subcuticular running 4-0 Monocryl. Incision was cleaned  and dried. Steri-Strips and a bulky sterile dressing applied. Hemovac  drain was hooked to suction and then he was awakened and transported to  recovery in stable condition.        Please note that a surgical assistant was a medical necessity for this procedure to perform it in a safe and expeditious manner. Assistant was necessary to provide appropriate retraction of vital neurovascular structures and to prevent femoral fracture and allow for anatomic placement of the prosthesis.  Gaynelle Arabian,  M.D.

## 2014-09-07 NOTE — Anesthesia Procedure Notes (Signed)
Spinal Patient location during procedure: OR Start time: 09/07/2014 2:53 PM End time: 09/07/2014 2:58 PM Staffing Anesthesiologist: Rod Mae Performed by: anesthesiologist  Preanesthetic Checklist Completed: patient identified, site marked, surgical consent, pre-op evaluation, timeout performed, IV checked, risks and benefits discussed and monitors and equipment checked Spinal Block Patient position: sitting Prep: Betadine Patient monitoring: heart rate, continuous pulse ox and blood pressure Approach: midline Location: L3-4 Injection technique: single-shot Needle Needle type: Spinocan  Needle gauge: 22 G Needle length: 9 cm Assessment Sensory level: T6 Additional Notes Expiration date of kit checked and confirmed. Patient tolerated procedure well, without complications.

## 2014-09-07 NOTE — Progress Notes (Signed)
X-ray results noted 

## 2014-09-07 NOTE — H&P (View-Only) (Signed)
Crystal Ramsey DOB: 04/14/1936 Married / Language: English / Race: White Female Date of Admission:  09/07/2014 CC:  Right Hip Pain History of Present Illness The patient is a 79 year old female who comes in for a preoperative History and Physical. The patient is scheduled for a right total hip arthroplasty (anterior) to be performed by Dr. Dione Plover. Aluisio, MD at [facility] on 09-28-2014. The patient comes in today 2 years out from right total knee arthroplasty. The patient states that she is doing fair at this time. The patient feels that they are progressing well at this time. She has had some lateral hip pain and wonders if the hip pain is making the knee hurt. She is requesting a refill on the Hydrocodone. The patient was also being followed for their right hip pain and trochanteric bursitis. They are 3 month(s) out from the last injection by Dr. Delilah Shan. The patient feels that they are doing poorly and report their pain level to be mild to moderate. Current treatment includes: relative rest. The patient has reported improvement of their symptoms with: Cortisone injections. Note for "Follow-up Hip": The hip has been painful for about 2-3 months. She gets a little soreness anteriorly in the thigh. Most of her pain is in the groin and buttock area. It radiates down to her knee. She has had an intraarticular hip injection and a lateral trochanteric injection and did get some benefit for a very short amount of time. She feels like she is losing more motion in her right hip. It is getting harder for her to do things. She is concerned that she may have a problem with the knee, which is causing this. I told her that the problems in her leg are coming from the hip and not from the knee. AP pelvis and lateral of the right hip shows severe bone on bone arthritis of the right hip, but large osteophyte formation and no joint space left. There is also some subchondral cystic change. She also has  some degenerative change in the left hip, but to a much lesser degree. AP and lateral of her right knee shows her prosthesis to be in excellent position with no periprosthetic abnormalities.She was glad to know at least we had a source of the problems. I told her that the most predictable means of improving her pain and function at this point would be a total hip arthroplasty. She would like to go ahead and get the hip replaced. We discussed that in detail today and she will go ahead and proceed with the surgery. They have been treated conservatively in the past for the above stated problem and despite conservative measures, they continue to have progressive pain and severe functional limitations and dysfunction. They have failed non-operative management including home exercise, medications, and injections. It is felt that they would benefit from undergoing total joint replacement. Risks and benefits of the procedure have been discussed with the patient and they elect to proceed with surgery. There are no active contraindications to surgery such as ongoing infection or rapidly progressive neurological disease.  Problem List/Past Medical  Primary osteoarthritis of left hip (M16.12) Osteoarthritis of right hip (M16.11) Osteoarthritis, Hip (715.35) bil Chronic Pain Autoimmune disorder Varicose veins Osteoarthritis Gastroesophageal Reflux Disease High blood pressure Fibromyalgia Cataract Impaired Hearing Depression Mumps Menopause Displacement, lumbar disc w/o myelopathy (722.10) (M51.26)08/15/1992 Bronchitis Urinary Tract Infection Degenerative Disc Disease Scarlet Fever Measles Streptococcus Infections Fibroadenoma Breast Disease  Allergies  Chlorhexidine Gluconate *Antiseptics & Disinfectants**  chlorascrub Demerol *ANALGESICS - OPIOID* Nausea, Vomiting. Zithromax *MACROLIDES* Dyes eye exam dyes Penicillins young adulthood reaction - does not  remember Dilaudid *ANALGESICS - OPIOID* Nausea, Vomiting. Sulfa Drugs unknown reaction - does not remember  Family History  Hypertension father and sister First Degree Relatives Osteoporosis sister Osteoarthritis father, sister, brother and grandmother mothers side Cancer First Degree Relatives. mother, sister and brother  Social History  Current work status retired Engineer, agricultural (Currently) no Illicit drug use no Tobacco use Former smoker. Alcohol use current drinker; drinks wine; 2-5 oz. Marital status married Previously in rehab no Exercise Exercises rarely Children 1 Post-Surgical Plans Plan is to look into skilled rehab facility. Living situation live with spouse Advance Directives Living Will, Healthcare POA Tobacco / smoke exposure yes outdoors only Number of flights of stairs before winded less than 1 Pain Contract no  Medication History Tylenol Extra Strength (500MG  Tablet, Oral) Active. Cipro (500MG  Tablet, Oral) Active. (1 talbet before dental appt. and 12 hours after) Hydrocodone-Acetaminophen (5-325MG  Tablet, Oral) Active. (1/2 tablet twice a day) PriLOSEC (20MG  Capsule DR, Oral) Active. (every other day) Tums (Oral) Specific dose unknown - Active. (3 500mg  tablets per day) Senna (Oral) Active. Aspirin Buffered (325MG  Tablet, Oral) Active. Clobetasol Propionate (0.05% Cream, External as needed) Active. Amiloride-Hydrochlorothiazide (5-50MG  Tablet, Oral) Active. (1/2 tablet)  Past Surgical History Dilation and Curettage of Uterus Cataract Surgery left Tonsillectomy Hysterectomy partial (non-cancerous) Breast Biopsy right Total Knee Replacement - Right Date: 05/25/2012.  Review of Systems (Tanner Vigna L. Shrihan Putt III PA-C; 08/18/2014 3:46 PM) General Not Present- Chills, Fatigue, Fever, Memory Loss, Night Sweats, Weight Gain and Weight Loss. Skin Not Present- Eczema, Hives, Itching, Lesions and Rash. HEENT Present-  Hearing Loss. Not Present- Dentures, Double Vision, Headache, Tinnitus and Visual Loss. Respiratory Not Present- Allergies, Chronic Cough, Coughing up blood, Shortness of breath at rest and Shortness of breath with exertion. Cardiovascular Not Present- Chest Pain, Difficulty Breathing Lying Down, Murmur, Palpitations, Racing/skipping heartbeats and Swelling. Gastrointestinal Present- Heartburn. Not Present- Abdominal Pain, Bloody Stool, Constipation, Diarrhea, Difficulty Swallowing, Jaundice, Loss of appetitie, Nausea and Vomiting. Female Genitourinary Present- Urinary Retention and Urinating at Night. Not Present- Blood in Urine, Discharge, Flank Pain, Incontinence, Painful Urination, Urgency, Urinary frequency and Weak urinary stream. Musculoskeletal Present- Back Pain, Joint Pain, Morning Stiffness and Muscle Weakness. Not Present- Joint Swelling, Muscle Pain and Spasms. Neurological Not Present- Blackout spells, Difficulty with balance, Dizziness, Paralysis, Tremor and Weakness. Psychiatric Not Present- Insomnia.  Vitals Weight: 162 lb Height: 63in Weight was reported by patient. Height was reported by patient. Body Surface Area: 1.77 m Body Mass Index: 28.7 kg/m  BP: 188/92 (Sitting, Right Arm, Standard)  Physical Exam General Mental Status -Alert, cooperative and good historian. General Appearance-pleasant, Not in acute distress. Orientation-Oriented X3. Build & Nutrition-Well nourished and Well developed.  Head and Neck Head-normocephalic, atraumatic . Neck Global Assessment - supple, no bruit auscultated on the right, no bruit auscultated on the left.  Eye Vision-Wears corrective lenses. Pupil - Bilateral-Regular and Round. Motion - Bilateral-EOMI.  Chest and Lung Exam Auscultation Breath sounds - clear at anterior chest wall and clear at posterior chest wall. Adventitious sounds - No Adventitious sounds.  Cardiovascular Auscultation Rhythm -  Regular rate and rhythm. Heart Sounds - S1 WNL and S2 WNL. Murmurs & Other Heart Sounds - Auscultation of the heart reveals - No Murmurs.  Abdomen Palpation/Percussion Tenderness - Abdomen is non-tender to palpation. Rigidity (guarding) - Abdomen is soft. Auscultation Auscultation of the abdomen reveals - Bowel sounds  normal.  Female Genitourinary Note: Not done, not pertinent to present illness   Musculoskeletal Note: On exam, she is alert and oriented and in no apparent distress. Her right hip could be flexed to 90 with no internal rotation, only about 5 degrees of external rotation and 5 degrees of abduction. Left hip flexion is 110 degrees, rotate in 10, out 20, abduction 20 without discomfort. Her right knee shows no effusion. Her range of motion in her right knee is about 0 to 122 degrees. There is no instability noted.  RADIOGRAPHS: AP pelvis and lateral of the right hip shows severe bone on bone arthritis of the right hip, but large osteophyte formation and no joint space left. There is also some subchondral cystic change. She also has some degenerative change in the left hip, but to a much lesser degree. AP and lateral of her right knee shows her prosthesis to be in excellent position with no periprosthetic abnormalities.   Assessment & Plan Osteoarthritis of right hip (M16.11)  Note:Surgical Plans: Right Total Hip Replacement - Anterior Approach  Disposition: Home  PCP: Dr. Lavone Orn - Patient has been seen preoperatively and felt to be stable for surgery.  IV TXA  Anesthesia Issues: NONE  Signed electronically by Joelene Millin, III PA-C

## 2014-09-07 NOTE — Interval H&P Note (Signed)
History and Physical Interval Note:  09/07/2014 2:44 PM  Crystal Ramsey  has presented today for surgery, with the diagnosis of OA OF RIGHT HIP  The various methods of treatment have been discussed with the patient and family. After consideration of risks, benefits and other options for treatment, the patient has consented to  Procedure(s): RIGHT TOTAL HIP ARTHROPLASTY ANTERIOR APPROACH (Right) as a surgical intervention .  The patient's history has been reviewed, patient examined, no change in status, stable for surgery.  I have reviewed the patient's chart and labs.  Questions were answered to the patient's satisfaction.     Gearlean Alf

## 2014-09-07 NOTE — Anesthesia Preprocedure Evaluation (Addendum)
Anesthesia Evaluation  Patient identified by MRN, date of birth, ID band Patient awake    Reviewed: Allergy & Precautions, H&P , NPO status , Patient's Chart, lab work & pertinent test results  History of Anesthesia Complications (+) PONV  Airway Mallampati: II  TM Distance: >3 FB Neck ROM: Full    Dental  (+) Caps, Dental Advisory Given All upper front are capped:   Pulmonary neg pulmonary ROS, former smoker,  breath sounds clear to auscultation  Pulmonary exam normal       Cardiovascular hypertension, Pt. on medications Rhythm:Regular Rate:Normal     Neuro/Psych negative neurological ROS  negative psych ROS   GI/Hepatic Neg liver ROS, GERD-  Medicated,  Endo/Other  negative endocrine ROS  Renal/GU negative Renal ROS  negative genitourinary   Musculoskeletal  (+) Osteoarthritis,  Fibromyalgia -  Abdominal   Peds negative pediatric ROS (+)  Hematology negative hematology ROS (+)   Anesthesia Other Findings   Reproductive/Obstetrics negative OB ROS                           Anesthesia Physical Anesthesia Plan  ASA: II  Anesthesia Plan: Spinal   Post-op Pain Management:    Induction:   Airway Management Planned: Simple Face Mask  Additional Equipment:   Intra-op Plan:   Post-operative Plan:   Informed Consent:   Plan Discussed with: Surgeon  Anesthesia Plan Comments:         Anesthesia Quick Evaluation

## 2014-09-07 NOTE — Transfer of Care (Signed)
Immediate Anesthesia Transfer of Care Note  Patient: Crystal Ramsey  Procedure(s) Performed: Procedure(s): RIGHT TOTAL HIP ARTHROPLASTY ANTERIOR APPROACH (Right)  Patient Location: PACU  Anesthesia Type:Regional and Spinal  Level of Consciousness: awake, sedated and patient cooperative  Airway & Oxygen Therapy: Patient Spontanous Breathing and Patient connected to face mask oxygen  Post-op Assessment: Report given to RN and Post -op Vital signs reviewed and stable  Post vital signs: Reviewed and stable  Last Vitals:  Filed Vitals:   09/07/14 1206  BP: 178/99  Pulse: 95  Temp: 36.5 C  Resp: 18    Complications: No apparent anesthesia complications

## 2014-09-08 ENCOUNTER — Encounter (HOSPITAL_COMMUNITY): Payer: Self-pay | Admitting: Orthopedic Surgery

## 2014-09-08 LAB — BASIC METABOLIC PANEL
Anion gap: 9 (ref 5–15)
BUN: 12 mg/dL (ref 6–23)
CHLORIDE: 101 mmol/L (ref 96–112)
CO2: 27 mmol/L (ref 19–32)
CREATININE: 0.6 mg/dL (ref 0.50–1.10)
Calcium: 8.3 mg/dL — ABNORMAL LOW (ref 8.4–10.5)
GFR calc Af Amer: >90 mL/min (ref >90)
GFR calc non Af Amer: 86 mL/min — ABNORMAL LOW (ref >90)
Glucose, Bld: 173 mg/dL — ABNORMAL HIGH (ref 70–99)
POTASSIUM: 3.1 mmol/L — AB (ref 3.5–5.1)
Sodium: 137 mmol/L (ref 135–145)

## 2014-09-08 LAB — CBC
HEMATOCRIT: 35.7 % — AB (ref 36.0–46.0)
Hemoglobin: 11.7 g/dL — ABNORMAL LOW (ref 12.0–15.0)
MCH: 30.5 pg (ref 26.0–34.0)
MCHC: 32.8 g/dL (ref 30.0–36.0)
MCV: 93 fL (ref 78.0–100.0)
Platelets: 212 10*3/uL (ref 150–400)
RBC: 3.84 MIL/uL — ABNORMAL LOW (ref 3.87–5.11)
RDW: 13.3 % (ref 11.5–15.5)
WBC: 5.9 10*3/uL (ref 4.0–10.5)

## 2014-09-08 MED ORDER — SENNOSIDES-DOCUSATE SODIUM 8.6-50 MG PO TABS: 1.0000 | ORAL_TABLET | Freq: Two times a day (BID) | ORAL | Status: DC | PRN

## 2014-09-08 NOTE — Discharge Instructions (Addendum)
Dr. Gaynelle Arabian Total Joint Specialist [facility] [address] [place], [place] [address] [phone]  ANTERIOR APPROACH TOTAL HIP REPLACEMENT POSTOPERATIVE DIRECTIONS   Hip Rehabilitation, Guidelines Following Surgery  The results of a hip operation are greatly improved after range of motion and muscle strengthening exercises. Follow all safety measures which are given to protect your hip. If any of these exercises cause increased pain or swelling in your joint, decrease the amount until you are comfortable again. Then slowly increase the exercises. Call your caregiver if you have problems or questions.   HOME CARE INSTRUCTIONS  Remove items at home which could result in a fall. This includes throw rugs or furniture in walking pathways.   ICE to the affected hip every three hours for 30 minutes at a time and then as needed for pain and swelling.  Continue to use ice on the hip for pain and swelling from surgery. You may notice swelling that will progress down to the foot and ankle.  This is normal after surgery.  Elevate the leg when you are not up walking on it.    Continue to use the breathing machine which will help keep your temperature down.  It is common for your temperature to cycle up and down following surgery, especially at night when you are not up moving around and exerting yourself.  The breathing machine keeps your lungs expanded and your temperature down.  Do not place pillow under knee, focus on keeping the knee straight while resting  DIET You may resume your previous home diet once your are discharged from the hospital.  DRESSING / WOUND CARE / SHOWERING You may change your dressing 3-5 days after surgery.  Then change the dressing every day with sterile gauze.  Please use good hand washing techniques before changing the dressing.  Do not use any lotions or creams on the incision until instructed by your surgeon. You may start showering  once you are discharged home but do not submerge the incision under water. Just pat the incision dry and apply a dry gauze dressing on daily. Change the surgical dressing daily and reapply a dry dressing each time.  ACTIVITY Walk with your walker as instructed. Use walker as long as suggested by your caregivers. Avoid periods of inactivity such as sitting longer than an hour when not asleep. This helps prevent blood clots.  You may resume a sexual relationship in one month or when given the OK by your doctor.  You may return to work once you are cleared by your doctor.  Do not drive a car for 6 weeks or until released by you surgeon.  Do not drive while taking narcotics.  WEIGHT BEARING Weight bearing as tolerated with assist device (walker, cane, etc) as directed, use it as long as suggested by your surgeon or therapist, typically at least 4-6 weeks.  POSTOPERATIVE CONSTIPATION PROTOCOL Constipation - defined medically as fewer than three stools per week and severe constipation as less than one stool per week.  One of the most common issues patients have following surgery is constipation.  Even if you have a regular bowel pattern at home, your normal regimen is likely to be disrupted due to multiple reasons following surgery.  Combination of anesthesia, postoperative narcotics, change in appetite and fluid intake all can affect your bowels.  In order to avoid complications following surgery, here are some recommendations in order to help you during your recovery period.  Colace (docusate) - Pick  up an over-the-counter form of Colace or another stool softener and take twice a day as long as you are requiring postoperative pain medications.  Take with a full glass of water daily.  If you experience loose stools or diarrhea, hold the colace until you stool forms back up.  If your symptoms do not get better within 1 week or if they get worse, check with your doctor. ° °Dulcolax (bisacodyl) - Pick up  over-the-counter and take as directed by the product packaging as needed to assist with the movement of your bowels.  Take with a full glass of water.  Use this product as needed if not relieved by Colace only.  ° °MiraLax (polyethylene glycol) - Pick up over-the-counter to have on hand.  MiraLax is a solution that will increase the amount of water in your bowels to assist with bowel movements.  Take as directed and can mix with a glass of water, juice, soda, coffee, or tea.  Take if you go more than two days without a movement. °Do not use MiraLax more than once per day. Call your doctor if you are still constipated or irregular after using this medication for 7 days in a row. ° °If you continue to have problems with postoperative constipation, please contact the office for further assistance and recommendations.  If you experience "the worst abdominal pain ever" or develop nausea or vomiting, please contact the office immediatly for further recommendations for treatment. ° °ITCHING ° If you experience itching with your medications, try taking only a single pain pill, or even half a pain pill at a time.  You can also use Benadryl over the counter for itching or also to help with sleep.  ° °TED HOSE STOCKINGS °Wear the elastic stockings on both legs for three weeks following surgery during the day but you may remove then at night for sleeping. ° °MEDICATIONS °See your medication summary on the “After Visit Summary” that the nursing staff will review with you prior to discharge.  You may have some home medications which will be placed on hold until you complete the course of blood thinner medication.  It is important for you to complete the blood thinner medication as prescribed by your surgeon.  Continue your approved medications as instructed at time of discharge. ° °PRECAUTIONS °If you experience chest pain or shortness of breath - call 911 immediately for transfer to the hospital emergency department.  °If you  develop a fever greater that 101 F, purulent drainage from wound, increased redness or drainage from wound, foul odor from the wound/dressing, or calf pain - CONTACT YOUR SURGEON.   °                                                °FOLLOW-UP APPOINTMENTS °Make sure you keep all of your appointments after your operation with your surgeon and caregivers. You should call the office at the above phone number and make an appointment for approximately two weeks after the date of your surgery or on the date instructed by your surgeon outlined in the "After Visit Summary". ° °RANGE OF MOTION AND STRENGTHENING EXERCISES  °These exercises are designed to help you keep full movement of your hip joint. Follow your caregiver's or physical therapist's instructions. Perform all exercises about fifteen times, three times per day or as directed. Exercise both hips,   even if you have had only one joint replacement. These exercises can be done on a training (exercise) mat, on the floor, on a table or on a bed. Use whatever works the best and is most comfortable for you. Use music or television while you are exercising so that the exercises are a pleasant break in your day. This will make your life better with the exercises acting as a break in routine you can look forward to.  Lying on your back, slowly slide your foot toward your buttocks, raising your knee up off the floor. Then slowly slide your foot back down until your leg is straight again.  Lying on your back spread your legs as far apart as you can without causing discomfort.  Lying on your side, raise your upper leg and foot straight up from the floor as far as is comfortable. Slowly lower the leg and repeat.  Lying on your back, tighten up the muscle in the front of your thigh (quadriceps muscles). You can do this by keeping your leg straight and trying to raise your heel off the floor. This helps strengthen the largest muscle supporting your knee.  Lying on your back,  tighten up the muscles of your buttocks both with the legs straight and with the knee bent at a comfortable angle while keeping your heel on the floor.   IF YOU ARE TRANSFERRED TO A SKILLED REHAB FACILITY If the patient is transferred to a skilled rehab facility following release from the hospital, a list of the current medications will be sent to the facility for the patient to continue.  When discharged from the skilled rehab facility, please have the facility set up the patient's [place] prior to being released. Also, the skilled facility will be responsible for providing the patient with their medications at time of release from the facility to include their pain medication, the muscle relaxants, and their blood thinner medication. If the patient is still at the rehab facility at time of the two week follow up appointment, the skilled rehab facility will also need to assist the patient in arranging follow up appointment in our office and any transportation needs.  MAKE SURE YOU:  Understand these instructions.  Get help right away if you are not doing well or get worse.    Pick up stool softner and laxative for home use following surgery while on pain medications. Do not submerge incision under water. Please use good hand washing techniques while changing dressing each day. May shower starting three days after surgery. Please use a clean towel to pat the incision dry following showers. Continue to use ice for pain and swelling after surgery. Do not use any lotions or creams on the incision until instructed by your surgeon.  Take Xarelto for two and a half more weeks, then discontinue Xarelto. Once the patient has completed the Xarelto, they may resume the 325 mg Aspirin.   Information on my medicine - XARELTO (Rivaroxaban)  This medication education was reviewed with me or my healthcare representative as part of my discharge preparation.  The pharmacist that spoke  with me during my hospital stay was:  Altha Harm  Why was Xarelto prescribed for you? Xarelto was prescribed for you to reduce the risk of blood clots forming after orthopedic surgery. The medical term for these abnormal blood clots is venous thromboembolism (VTE).  What do you need to know about xarelto ? Take your Xarelto ONCE DAILY at the same time every  day. You may take it either with or without food.  If you have difficulty swallowing the tablet whole, you may crush it and mix in applesauce just prior to taking your dose.  Take Xarelto exactly as prescribed by your doctor and DO NOT stop taking Xarelto without talking to the doctor who prescribed the medication.  Stopping without other VTE prevention medication to take the place of Xarelto may increase your risk of developing a clot.  After discharge, you should have regular check-up appointments with your healthcare provider that is prescribing your Xarelto.    What do you do if you miss a dose? If you miss a dose, take it as soon as you remember on the same day then continue your regularly scheduled once daily regimen the next day. Do not take two doses of Xarelto on the same day.   Important Safety Information A possible side effect of Xarelto is bleeding. You should call your healthcare provider right away if you experience any of the following: ? Bleeding from an injury or your nose that does not stop. ? Unusual colored urine (red or dark brown) or unusual colored stools (red or black). ? Unusual bruising for unknown reasons. ? A serious fall or if you hit your head (even if there is no bleeding).  Some medicines may interact with Xarelto and might increase your risk of bleeding while on Xarelto. To help avoid this, consult your healthcare provider or pharmacist prior to using any new prescription or non-prescription medications, including herbals, vitamins, non-steroidal anti-inflammatory drugs (NSAIDs) and  supplements.  This website has more information on Xarelto: [email or web address]

## 2014-09-08 NOTE — Evaluation (Signed)
Occupational Therapy Evaluation and Discharge Patient Details Name: Crystal Ramsey MRN: [id number] DOB: 03-01-1936 Today's Date: 09/08/2014    History of Present Illness s/p   R   DA THA   Clinical Impression   This 79 yo female admitted with above presents to acute OT with all education completed, we will D/C from acute OT.    Follow Up Recommendations  No OT follow up    Equipment Recommendations  None recommended by OT       Precautions / Restrictions Precautions Precautions: None Restrictions Weight Bearing Restrictions: No Other Position/Activity Restrictions: WBAT      Mobility Bed Mobility     General bed mobility comments: Pt up in recliner upon my arrival  Transfers Overall transfer level: Needs assistance Equipment used: Rolling walker (2 wheeled) Transfers: Sit to/from Stand Sit to Stand: Supervision         General transfer comment: cues for hand placement and safety          ADL Overall ADL's : Needs assistance/impaired Eating/Feeding: Independent;Sitting   Grooming: Set up;Sitting   Upper Body Bathing: Set up;Sitting   Lower Body Bathing: Supervison/ safety;Set up;With adaptive equipment;Sit to/from stand   Upper Body Dressing : Set up;Sitting   Lower Body Dressing: Supervision/safety;Set up;With adaptive equipment;Sit to/from stand   Toilet Transfer: Supervision/safety;Ambulation;RW;Comfort height toilet;Grab bars   Toileting- Clothing Manipulation and Hygiene: Supervision/safety;Sit to/from stand   Tub/ Shower Transfer: Supervision/safety;Ambulation;Rolling walker;3 in 1     General ADL Comments: educated pt on use of AE and had her practice with it as well as where she can get it.     Vision Additional Comments: No change from baseline          Pertinent Vitals/Pain Pain Assessment: 0-10 Pain Score: 2  Pain Location: right hip Pain Descriptors / Indicators: Aching;Sore Pain Intervention(s): Monitored during  session;Repositioned     Hand Dominance Right   Extremity/Trunk Assessment Upper Extremity Assessment Upper Extremity Assessment: Overall WFL for tasks assessed   Lower Extremity Assessment Lower Extremity Assessment: RLE deficits/detail RLE Deficits / Details: hip and knee 3/5; ankle WFL       Communication Communication Communication: No difficulties   Cognition Arousal/Alertness: Awake/alert Behavior During Therapy: WFL for tasks assessed/performed Overall Cognitive Status: Within Functional Limits for tasks assessed                                Home Living Family/patient expects to be discharged to:: Private residence Living Arrangements: Spouse/significant other Available Help at Discharge: Available PRN/intermittently (most of the time) Type of Home: House       Home Layout: Two level;Able to live on main level with bedroom/bathroom     Bathroom Shower/Tub: Walk-in shower;Door   [employer] Toilet: Standard     Home Equipment: Environmental consultant - 2 wheels;Bedside commode   Additional Comments: walk in shower, uses 3in1 in shower      Prior Functioning/Environment Level of Independence: Independent             OT Diagnosis: Generalized weakness;Acute pain         OT Goals(Current goals can be found in the care plan section) Acute Rehab OT Goals Patient Stated Goal: home tomorrow  OT Frequency:                End of Session Equipment Utilized During Treatment: Rolling walker  Activity Tolerance: Patient tolerated treatment well Patient left: in chair;with  call bell/phone within reach   Time: 1049-1117 OT Time Calculation (min): 28 min Charges:  OT General Charges $OT Visit: 1 Procedure OT Evaluation $Initial OT Evaluation Tier I: 1 Procedure OT Treatments $Self Care/Home Management : 8-22 mins  Almon Register [phone] 09/08/2014, 11:38 AM

## 2014-09-08 NOTE — Progress Notes (Signed)
   09/08/14 1400  PT Visit Information  Last PT Received On 09/08/14  Assistance Needed +1  History of Present Illness s/p   R   DA THA  PT Time Calculation  PT Start Time (ACUTE ONLY) 1436  PT Stop Time (ACUTE ONLY) 1447  PT Time Calculation (min) (ACUTE ONLY) 11 min  Subjective Data  Patient Stated Goal home tomorrow  Precautions  Precautions None  Restrictions  Other Position/Activity Restrictions WBAT  Pain Assessment  Pain Assessment 0-10  Pain Score 7  Pain Location right hip  Pain Descriptors / Indicators Aching;Constant  Pain Intervention(s) Limited activity within patient's tolerance;Monitored during session;Premedicated before session;Ice applied;Repositioned  Cognition  Arousal/Alertness Awake/alert  Behavior During Therapy WFL for tasks assessed/performed  Overall Cognitive Status Within Functional Limits for tasks assessed  Total Joint Exercises  Ankle Circles/Pumps AROM;Both;10 reps  [employer]- reps  Short Arc Quad AROM;Right;10 reps  Heel Slides AAROM;Right;10 reps  Hip ABduction/ADduction AAROM;Right;10 reps  PT - End of Session  Activity Tolerance No increased pain;Patient limited by pain  Patient left with call bell/phone within reach;in bed  PT - Assessment/Plan  PT Plan Current plan remains appropriate  PT Frequency (ACUTE ONLY) 7X/week  Follow Up Recommendations Home health PT  PT equipment None recommended by PT  PT Goal Progression  Progress towards PT goals Progressing toward goals  Acute Rehab PT Goals  PT Goal Formulation With patient  Time For Goal Achievement 09/15/14  Potential to Achieve Goals Good  PT General Charges  $$ ACUTE PT VISIT 1 Procedure  PT Treatments  $Therapeutic Exercise 8-22 mins

## 2014-09-08 NOTE — Care Management Note (Signed)
    Page 1 of 1   09/08/2014     12:35:29 PM CARE MANAGEMENT NOTE 09/08/2014  Patient:  Crystal Ramsey, Crystal Ramsey   Account Number:  [id number]  Date Initiated:  09/08/2014  Documentation initiated by:  [facility]  Subjective/Objective Assessment:   adm: RIGHT TOTAL HIP ARTHROPLASTY ANTERIOR APPROACH (Right)     Action/Plan:   discharge planning   Anticipated DC Date:  09/09/2014   Anticipated DC Plan:  [place]  CM consult      [facility] [address] Choice  HOME HEALTH   Choice offered to / List presented to:  C-1 Patient        [place] arranged  [place]      [place]   Status of service:   Medicare Important Message given?   (If response is "NO", the following Medicare IM given date fields will be blank) Date Medicare IM given:   Medicare IM given by:   Date Additional Medicare IM given:   Additional Medicare IM given by:    Discharge Disposition:  [place]  Per UR Regulation:    If discussed at Long Length of Stay Meetings, dates discussed:    Comments:  09/08/14 10:30 CM met with pt in room to offer choice of home health agency.  Pt chooses [facility] too render HHPT. Address and contact information verified by pt.  Pt has both a rolling walker and 3n1 at home.  referral emailed to [employer] rep, Tim.  No other CM needs were communicated. Mariane Masters, BSn, [place].

## 2014-09-08 NOTE — Progress Notes (Signed)
Utilization review completed.  

## 2014-09-08 NOTE — Evaluation (Signed)
Physical Therapy Evaluation Patient Details Name: Crystal Ramsey MRN: [id number] DOB: 1935-06-08 Today's Date: 09/08/2014   History of Present Illness  s/p   R   DA THA  Clinical Impression  Pt will benefit from PT to address deficits below; plan is for home with HHPT, pt has DME    Follow Up Recommendations Home health PT    Equipment Recommendations  None recommended by PT    Recommendations for Other Services       Precautions / Restrictions Precautions Precautions: None Restrictions Weight Bearing Restrictions: No Other Position/Activity Restrictions: WBAT      Mobility  Bed Mobility Overal bed mobility: Needs Assistance Bed Mobility: Supine to Sit     Supine to sit: Min assist     General bed mobility comments: Pt up in recliner upon my arrival  Transfers Overall transfer level: Needs assistance Equipment used: Rolling walker (2 wheeled) Transfers: Sit to/from Stand Sit to Stand: Supervision         General transfer comment: cues for hand placement and safety   Ambulation/Gait Ambulation/Gait assistance: Min guard Ambulation Distance (Feet): 60 Feet Assistive device: Rolling walker (2 wheeled) Gait Pattern/deviations: Step-to pattern;Antalgic     General Gait Details: cues for RW position from self  Stairs            Wheelchair Mobility    Modified Rankin (Stroke Patients Only)       Balance                                             Pertinent Vitals/Pain Pain Assessment: 0-10 Pain Score: 2  Pain Location: right hip Pain Descriptors / Indicators: Aching;Sore Pain Intervention(s): Monitored during session;Repositioned    Home Living Family/patient expects to be discharged to:: Private residence Living Arrangements: Spouse/significant other Available Help at Discharge: Available PRN/intermittently (most of the time) Type of Home: House       Home Layout: Two level;Able to live on main level with  bedroom/bathroom Home Equipment: Gilford Rile - 2 wheels;Bedside commode Additional Comments: walk in shower, uses 3in1 in shower    Prior Function Level of Independence: Independent               Hand Dominance   Dominant Hand: Right    Extremity/Trunk Assessment   Upper Extremity Assessment: Overall WFL for tasks assessed           Lower Extremity Assessment: RLE deficits/detail RLE Deficits / Details: hip and knee 3/5; ankle WFL       Communication   Communication: No difficulties  Cognition Arousal/Alertness: Awake/alert Behavior During Therapy: WFL for tasks assessed/performed Overall Cognitive Status: Within Functional Limits for tasks assessed                      General Comments      Exercises Total Joint Exercises Ankle Circles/Pumps: AROM;Both;10 reps Quad Sets: Both;AROM;5 reps      Assessment/Plan    PT Assessment Patient needs continued PT services  PT Diagnosis Difficulty walking   PT Problem List Decreased strength;Decreased range of motion;Decreased activity tolerance;Decreased mobility  PT Treatment Interventions DME instruction;Gait training;Stair training;Functional mobility training;Therapeutic activities;Patient/family education;Therapeutic exercise   PT Goals (Current goals can be found in the Care Plan section) Acute Rehab PT Goals Patient Stated Goal: home tomorrow PT Goal Formulation: With patient Time For Goal Achievement: 09/15/14  Potential to Achieve Goals: Good    Frequency 7X/week   Barriers to discharge        Co-evaluation               End of Session Equipment Utilized During Treatment: Gait belt Activity Tolerance: Patient tolerated treatment well Patient left: with call bell/phone within reach;in chair Nurse Communication: Mobility status         Time: 8676-7209 PT Time Calculation (min) (ACUTE ONLY): 22 min   Charges:   PT Evaluation $Initial PT Evaluation Tier I: 1 Procedure     PT G  CodesKenyon Ana 09-25-2014, 12:39 PM

## 2014-09-08 NOTE — Progress Notes (Signed)
   Subjective: 1 Day Post-Op Procedure(s) (LRB): RIGHT TOTAL HIP ARTHROPLASTY ANTERIOR APPROACH (Right) Patient reports pain as mild.   We will start therapy today.  Plan is to go Home after hospital stay.  Objective: Vital signs in last 24 hours: Temp:  [97.4 F (36.3 C)-98.4 F (36.9 C)] 97.7 F (36.5 C) (04/28 0544) Pulse Rate:  [55-95] 75 (04/28 0544) Resp:  [11-18] 16 (04/28 0544) BP: (128-178)/(60-99) 145/97 mmHg (04/28 0544) SpO2:  [96 %-100 %] 99 % (04/28 0544) Weight:  [74.844 kg (165 lb)] 74.844 kg (165 lb) (04/27 1206)  Intake/Output from previous day:  Intake/Output Summary (Last 24 hours) at 09/08/14 0726 Last data filed at 09/08/14 0649  Gross per 24 hour  Intake   4780 ml  Output   3970 ml  Net    810 ml    Intake/Output this shift:    Labs:  Recent Labs  09/08/14 0410  HGB 11.7*    Recent Labs  09/08/14 0410  WBC 5.9  RBC 3.84*  HCT 35.7*  PLT 212    Recent Labs  09/08/14 0410  NA 137  K 3.1*  CL 101  CO2 27  BUN 12  CREATININE 0.60  GLUCOSE 173*  CALCIUM 8.3*   No results for input(s): LABPT, INR in the last 72 hours.  EXAM General - Patient is Alert, Appropriate and Oriented Extremity - Neurologically intact Neurovascular intact No cellulitis present Compartment soft Dressing - dressing C/D/I Motor Function - intact, moving foot and toes well on exam.  Hemovac pulled without difficulty.  Past Medical History  Diagnosis Date  . Arthritis   . Hypertension   . Lichen plano-pilaris   . Complication of anesthesia   . PONV (postoperative nausea and vomiting)   . Fibromyalgia     PT THINKS SHE HAS FIBROMYALGIA  . GERD (gastroesophageal reflux disease)   . Constipation, chronic   . High risk HPV infection 12/2012    Pap smear cytology normal with positive HR HPV  . Back pain     Assessment/Plan: 1 Day Post-Op Procedure(s) (LRB): RIGHT TOTAL HIP ARTHROPLASTY ANTERIOR APPROACH (Right) Principal Problem:   OA  (osteoarthritis) of hip   Advance diet Up with therapy D/C IV fluids Plan for discharge tomorrow  DVT Prophylaxis - Xarelto Weight Bearing As Tolerated right Leg   Alexandra Posadas V

## 2014-09-09 LAB — CBC
HCT: 33.3 % — ABNORMAL LOW (ref 36.0–46.0)
Hemoglobin: 11 g/dL — ABNORMAL LOW (ref 12.0–15.0)
MCH: 30.6 pg (ref 26.0–34.0)
MCHC: 33 g/dL (ref 30.0–36.0)
MCV: 92.8 fL (ref 78.0–100.0)
Platelets: 210 10*3/uL (ref 150–400)
RBC: 3.59 MIL/uL — ABNORMAL LOW (ref 3.87–5.11)
RDW: 13.5 % (ref 11.5–15.5)
WBC: 9 10*3/uL (ref 4.0–10.5)

## 2014-09-09 LAB — BASIC METABOLIC PANEL
ANION GAP: 4 — AB (ref 5–15)
BUN: 21 mg/dL (ref 6–23)
CO2: 29 mmol/L (ref 19–32)
Calcium: 8.3 mg/dL — ABNORMAL LOW (ref 8.4–10.5)
Chloride: 103 mmol/L (ref 96–112)
Creatinine, Ser: 0.74 mg/dL (ref 0.50–1.10)
GFR calc non Af Amer: 81 mL/min — ABNORMAL LOW (ref >90)
Glucose, Bld: 113 mg/dL — ABNORMAL HIGH (ref 70–99)
Potassium: 2.9 mmol/L — ABNORMAL LOW (ref 3.5–5.1)
SODIUM: 136 mmol/L (ref 135–145)

## 2014-09-09 MED ORDER — HYDROCODONE-ACETAMINOPHEN 7.5-325 MG PO TABS: 1.0000 | ORAL_TABLET | ORAL | Status: DC | PRN

## 2014-09-09 MED ORDER — RIVAROXABAN 10 MG PO TABS: 10.0000 mg | ORAL_TABLET | Freq: Every day | ORAL | Status: DC

## 2014-09-09 MED ORDER — METHOCARBAMOL 500 MG PO TABS: 500.0000 mg | ORAL_TABLET | Freq: Four times a day (QID) | ORAL | Status: DC | PRN

## 2014-09-09 MED ORDER — TRAMADOL HCL 50 MG PO TABS: 50.0000 mg | ORAL_TABLET | Freq: Four times a day (QID) | ORAL | Status: DC | PRN

## 2014-09-09 NOTE — Discharge Summary (Signed)
Physician Discharge Summary   Patient ID: Crystal Ramsey MRN: [id number] DOB/AGE: [birth year] 79 y.o.  Admit date: 09/07/2014 Discharge date: 09-09-2014  Primary Diagnosis:  Osteoarthritis of the Right hip.   Admission Diagnoses:  Past Medical History  Diagnosis Date  . Arthritis   . Hypertension   . Lichen plano-pilaris   . Complication of anesthesia   . PONV (postoperative nausea and vomiting)   . Fibromyalgia     PT THINKS SHE HAS FIBROMYALGIA  . GERD (gastroesophageal reflux disease)   . Constipation, chronic   . High risk HPV infection 12/2012    Pap smear cytology normal with positive HR HPV  . Back pain    Discharge Diagnoses:   Principal Problem:   OA (osteoarthritis) of hip  Estimated body mass index is 30.17 kg/(m^2) as calculated from the following:   Height as of this encounter: '5\' 2"'  (1.575 m).   Weight as of this encounter: 74.844 kg (165 lb).  Procedure(s) (LRB): RIGHT TOTAL HIP ARTHROPLASTY ANTERIOR APPROACH (Right)   Consults: None  HPI: Crystal Ramsey is a 79 y.o. female who has advanced end-  stage arthritis of her Right hip with progressively worsening pain and  dysfunction.The patient has failed nonoperative management and presents for  total hip arthroplasty.   Laboratory Data: Admission on 09/07/2014  Component Date Value Ref Range Status  . WBC 09/08/2014 5.9  4.0 - 10.5 K/uL Final  . RBC 09/08/2014 3.84* 3.87 - 5.11 MIL/uL Final  . Hemoglobin 09/08/2014 11.7* 12.0 - 15.0 g/dL Final  . HCT 09/08/2014 35.7* 36.0 - 46.0 % Final  . MCV 09/08/2014 93.0  78.0 - 100.0 fL Final  . MCH 09/08/2014 30.5  26.0 - 34.0 pg Final  . MCHC 09/08/2014 32.8  30.0 - 36.0 g/dL Final  . RDW 09/08/2014 13.3  11.5 - 15.5 % Final  . Platelets 09/08/2014 212  150 - 400 K/uL Final  . Sodium 09/08/2014 137  135 - 145 mmol/L Final  . Potassium 09/08/2014 3.1* 3.5 - 5.1 mmol/L Final  . Chloride 09/08/2014 101  96 - 112 mmol/L Final  . CO2 09/08/2014 27  19 -  32 mmol/L Final  . Glucose, Bld 09/08/2014 173* 70 - 99 mg/dL Final  . BUN 09/08/2014 12  6 - 23 mg/dL Final  . Creatinine, Ser 09/08/2014 0.60  0.50 - 1.10 mg/dL Final  . Calcium 09/08/2014 8.3* 8.4 - 10.5 mg/dL Final  . GFR calc non Af Amer 09/08/2014 86* >90 mL/min Final  . GFR calc Af Amer 09/08/2014 >90  >90 mL/min Final   Comment: (NOTE) The eGFR has been calculated using the CKD EPI equation. This calculation has not been validated in all clinical situations. eGFR's persistently <90 mL/min signify possible Chronic Kidney Disease.   . Anion gap 09/08/2014 9  5 - 15 Final  . WBC 09/09/2014 9.0  4.0 - 10.5 K/uL Final  . RBC 09/09/2014 3.59* 3.87 - 5.11 MIL/uL Final  . Hemoglobin 09/09/2014 11.0* 12.0 - 15.0 g/dL Final  . HCT 09/09/2014 33.3* 36.0 - 46.0 % Final  . MCV 09/09/2014 92.8  78.0 - 100.0 fL Final  . MCH 09/09/2014 30.6  26.0 - 34.0 pg Final  . MCHC 09/09/2014 33.0  30.0 - 36.0 g/dL Final  . RDW 09/09/2014 13.5  11.5 - 15.5 % Final  . Platelets 09/09/2014 210  150 - 400 K/uL Final  . Sodium 09/09/2014 136  135 - 145 mmol/L Final  . Potassium 09/09/2014 2.9* 3.5 -  5.1 mmol/L Final  . Chloride 09/09/2014 103  96 - 112 mmol/L Final  . CO2 09/09/2014 29  19 - 32 mmol/L Final  . Glucose, Bld 09/09/2014 113* 70 - 99 mg/dL Final  . BUN 09/09/2014 21  6 - 23 mg/dL Final  . Creatinine, Ser 09/09/2014 0.74  0.50 - 1.10 mg/dL Final  . Calcium 09/09/2014 8.3* 8.4 - 10.5 mg/dL Final  . GFR calc non Af Amer 09/09/2014 81* >90 mL/min Final  . GFR calc Af Amer 09/09/2014 >90  >90 mL/min Final   Comment: (NOTE) The eGFR has been calculated using the CKD EPI equation. This calculation has not been validated in all clinical situations. eGFR's persistently <90 mL/min signify possible Chronic Kidney Disease.   Georgiann Hahn gap 09/09/2014 4* 5 - 15 Final  Hospital Outpatient Visit on 08/31/2014  Component Date Value Ref Range Status  . aPTT 08/31/2014 33  24 - 37 seconds Final  . WBC  08/31/2014 4.9  4.0 - 10.5 K/uL Final  . RBC 08/31/2014 4.36  3.87 - 5.11 MIL/uL Final  . Hemoglobin 08/31/2014 13.4  12.0 - 15.0 g/dL Final  . HCT 08/31/2014 40.9  36.0 - 46.0 % Final  . MCV 08/31/2014 93.8  78.0 - 100.0 fL Final  . MCH 08/31/2014 30.7  26.0 - 34.0 pg Final  . MCHC 08/31/2014 32.8  30.0 - 36.0 g/dL Final  . RDW 08/31/2014 13.5  11.5 - 15.5 % Final  . Platelets 08/31/2014 280  150 - 400 K/uL Final  . Sodium 08/31/2014 137  135 - 145 mmol/L Final  . Potassium 08/31/2014 3.6  3.5 - 5.1 mmol/L Final  . Chloride 08/31/2014 97  96 - 112 mmol/L Final  . CO2 08/31/2014 31  19 - 32 mmol/L Final  . Glucose, Bld 08/31/2014 104* 70 - 99 mg/dL Final  . BUN 08/31/2014 17  6 - 23 mg/dL Final  . Creatinine, Ser 08/31/2014 0.65  0.50 - 1.10 mg/dL Final  . Calcium 08/31/2014 9.2  8.4 - 10.5 mg/dL Final  . Total Protein 08/31/2014 7.2  6.0 - 8.3 g/dL Final  . Albumin 08/31/2014 4.2  3.5 - 5.2 g/dL Final  . AST 08/31/2014 22  0 - 37 U/L Final  . ALT 08/31/2014 18  0 - 35 U/L Final  . Alkaline Phosphatase 08/31/2014 85  39 - 117 U/L Final  . Total Bilirubin 08/31/2014 0.4  0.3 - 1.2 mg/dL Final  . GFR calc non Af Amer 08/31/2014 84* >90 mL/min Final  . GFR calc Af Amer 08/31/2014 >90  >90 mL/min Final   Comment: (NOTE) The eGFR has been calculated using the CKD EPI equation. This calculation has not been validated in all clinical situations. eGFR's persistently <90 mL/min signify possible Chronic Kidney Disease.   . Anion gap 08/31/2014 9  5 - 15 Final  . Prothrombin Time 08/31/2014 13.5  11.6 - 15.2 seconds Final  . INR 08/31/2014 1.02  0.00 - 1.49 Final  . Color, Urine 08/31/2014 YELLOW  YELLOW Final  . APPearance 08/31/2014 CLEAR  CLEAR Final  . Specific Gravity, Urine 08/31/2014 1.014  1.005 - 1.030 Final  . pH 08/31/2014 6.0  5.0 - 8.0 Final  . Glucose, UA 08/31/2014 NEGATIVE  NEGATIVE mg/dL Final  . Hgb urine dipstick 08/31/2014 NEGATIVE  NEGATIVE Final  . Bilirubin Urine  08/31/2014 NEGATIVE  NEGATIVE Final  . Ketones, ur 08/31/2014 NEGATIVE  NEGATIVE mg/dL Final  . Protein, ur 08/31/2014 NEGATIVE  NEGATIVE mg/dL Final  .  Urobilinogen, UA 08/31/2014 0.2  0.0 - 1.0 mg/dL Final  . Nitrite 08/31/2014 NEGATIVE  NEGATIVE Final  . Leukocytes, UA 08/31/2014 TRACE* NEGATIVE Final  . MRSA, PCR 08/31/2014 NEGATIVE  NEGATIVE Final  . Staphylococcus aureus 08/31/2014 NEGATIVE  NEGATIVE Final   Comment:        The Xpert SA Assay (FDA approved for NASAL specimens in patients over 62 years of age), is one component of a comprehensive surveillance program.  Test performance has been validated by [facility] for patients greater than or equal to 31 year old. It is not intended to diagnose infection nor to guide or monitor treatment.   . ABO/RH(D) 08/31/2014 O POS   Final  . Antibody Screen 08/31/2014 NEG   Final  . Sample Expiration 08/31/2014 09/10/2014   Final  . Squamous Epithelial / LPF 08/31/2014 RARE  RARE Final  . WBC, UA 08/31/2014 0-2  <3 WBC/hpf Final     X-Rays:Dg Pelvis Portable  09/07/2014   CLINICAL DATA:  Status post right hip arthroplasty.  EXAM: PORTABLE PELVIS 1-2 VIEWS  COMPARISON:  None.  FINDINGS: Right hip non cemented prosthetic components are well-seated and aligned. There is no acute fracture or evidence of an operative complication.  IMPRESSION: Well-aligned right hip arthroplasty.   Electronically Signed   By: Lajean Manes M.D.   On: 09/07/2014 17:29   Dg C-arm 1-60 Min-no Report  09/07/2014   CLINICAL DATA: surgery   C-ARM 1-60 MINUTES  Fluoroscopy was utilized by the requesting physician.  No radiographic  interpretation.     EKG: Orders placed or performed during the hospital encounter of 08/31/14  . EKG 12-Lead  . EKG 12-Lead     Hospital Course: Patient was admitted to [facility] and taken to the OR and underwent the above state procedure without complications.  Patient tolerated the procedure well and was later  transferred to the recovery room and then to the orthopaedic floor for postoperative care.  They were given PO and IV analgesics for pain control following their surgery.  They were given 24 hours of postoperative antibiotics of  Anti-infectives    Start     Dose/Rate Route Frequency Ordered Stop   09/08/14 0600  vancomycin (VANCOCIN) IVPB 1000 mg/200 mL premix     1,000 mg 200 mL/hr over 60 Minutes Intravenous On call to O.R. 09/07/14 1206 09/07/14 1445   09/08/14 0300  vancomycin (VANCOCIN) IVPB 1000 mg/200 mL premix     1,000 mg 200 mL/hr over 60 Minutes Intravenous Every 12 hours 09/07/14 1806 09/08/14 0355     and started on DVT prophylaxis in the form of Xarelto.   PT and OT were ordered for total hip protocol.  The patient was allowed to be WBAT with therapy. Discharge planning was consulted to help with postop disposition and equipment needs.  Patient had a decent night on the evening of surgery.  They started to get up OOB with therapy on day one.  Hemovac drain was pulled without difficulty.  Continued to work with therapy into day two.  Dressing was changed on day two and the incision was healing well.   Patient was seen in rounds by Dr. Wynelle Link and was ready to go home.  Diet: Cardiac diet Activity:WBAT Follow-up:in 2 weeks on Monday 09/19/2014 Disposition - Home Discharged Condition: good       Discharge Instructions    Call MD / Call 911    Complete by:  As directed   If you  experience chest pain or shortness of breath, CALL 911 and be transported to the hospital emergency room.  If you develope a fever above 101 F, pus (white drainage) or increased drainage or redness at the wound, or calf pain, call your surgeon's office.     Change dressing    Complete by:  As directed   You may change your dressing dressing daily with sterile 4 x 4 inch gauze dressing and paper tape.  Do not submerge the incision under water.     Constipation Prevention    Complete by:  As directed    Drink plenty of fluids.  Prune juice may be helpful.  You may use a stool softener, such as Colace (over the counter) 100 mg twice a day.  Use MiraLax (over the counter) for constipation as needed.     Diet - low sodium heart healthy    Complete by:  As directed      Discharge instructions    Complete by:  As directed   Pick up stool softner and laxative for home use following surgery while on pain medications. Do not submerge incision under water. Please use good hand washing techniques while changing dressing each day. May shower starting three days after surgery. Please use a clean towel to pat the incision dry following showers. Continue to use ice for pain and swelling after surgery. Do not use any lotions or creams on the incision until instructed by your surgeon.  Total Hip Protocol.  Take Xarelto for two and a half more weeks, then discontinue Xarelto. Once the patient has completed the Xarelto, they may resume the 325 mg Aspirin.  Postoperative Constipation Protocol  Constipation - defined medically as fewer than three stools per week and severe constipation as less than one stool per week.  One of the most common issues patients have following surgery is constipation.  Even if you have a regular bowel pattern at home, your normal regimen is likely to be disrupted due to multiple reasons following surgery.  Combination of anesthesia, postoperative narcotics, change in appetite and fluid intake all can affect your bowels.  In order to avoid complications following surgery, here are some recommendations in order to help you during your recovery period.  Colace (docusate) - Pick up an over-the-counter form of Colace or another stool softener and take twice a day as long as you are requiring postoperative pain medications.  Take with a full glass of water daily.  If you experience loose stools or diarrhea, hold the colace until you stool forms back up.  If your symptoms do not get better  within 1 week or if they get worse, check with your doctor.  Dulcolax (bisacodyl) - Pick up over-the-counter and take as directed by the product packaging as needed to assist with the movement of your bowels.  Take with a full glass of water.  Use this product as needed if not relieved by Colace only.   MiraLax (polyethylene glycol) - Pick up over-the-counter to have on hand.  MiraLax is a solution that will increase the amount of water in your bowels to assist with bowel movements.  Take as directed and can mix with a glass of water, juice, soda, coffee, or tea.  Take if you go more than two days without a movement. Do not use MiraLax more than once per day. Call your doctor if you are still constipated or irregular after using this medication for 7 days in a row.  If you  continue to have problems with postoperative constipation, please contact the office for further assistance and recommendations.  If you experience "the worst abdominal pain ever" or develop nausea or vomiting, please contact the office immediatly for further recommendations for treatment.     Do not sit on low chairs, stoools or toilet seats, as it may be difficult to get up from low surfaces    Complete by:  As directed      Driving restrictions    Complete by:  As directed   No driving until released by the physician.     Increase activity slowly as tolerated    Complete by:  As directed      Lifting restrictions    Complete by:  As directed   No lifting until released by the physician.     Patient may shower    Complete by:  As directed   You may shower without a dressing once there is no drainage.  Do not wash over the wound.  If drainage remains, do not shower until drainage stops.     TED hose    Complete by:  As directed   Use stockings (TED hose) for 3 weeks on both leg(s).  You may remove them at night for sleeping.     Weight bearing as tolerated    Complete by:  As directed   Laterality:  right  Extremity:   Lower            Medication List    STOP taking these medications        aspirin 325 MG buffered tablet     BIOFREEZE EX     calcium carbonate 500 MG chewable tablet  Commonly known as:  TUMS - dosed in mg elemental calcium     clobetasol cream 0.05 %  Commonly known as:  TEMOVATE     HYDROcodone-acetaminophen 5-325 MG per tablet  Commonly known as:  NORCO/VICODIN  Replaced by:  HYDROcodone-acetaminophen 7.5-325 MG per tablet     multivitamin with minerals Tabs tablet      TAKE these medications        acetaminophen 500 MG tablet  Commonly known as:  TYLENOL  Take 500 mg by mouth every 6 (six) hours as needed for mild pain.     amiloride-hydrochlorothiazide 5-50 MG tablet  Commonly known as:  MODURETIC  Take 0.75 tablets by mouth every other day. Takes 3/4  tablet     diphenhydrAMINE 25 mg capsule  Commonly known as:  BENADRYL  Take 25 mg by mouth at bedtime as needed for sleep.     HYDROcodone-acetaminophen 7.5-325 MG per tablet  Commonly known as:  NORCO  Take 1-2 tablets by mouth every 4 (four) hours as needed for moderate pain.     hydrocortisone cream 1 %  Apply 1 application topically 2 (two) times daily.     methocarbamol 500 MG tablet  Commonly known as:  ROBAXIN  Take 1 tablet (500 mg total) by mouth every 6 (six) hours as needed for muscle spasms.     omeprazole 20 MG tablet  Commonly known as:  PRILOSEC OTC  Take 20 mg by mouth every other day.     rivaroxaban 10 MG Tabs tablet  Commonly known as:  XARELTO  - Take 1 tablet (10 mg total) by mouth daily with breakfast. Take Xarelto for two and a half more weeks, then discontinue Xarelto.  - Once the patient has completed the Xarelto, they may resume the  325 mg Aspirin.     traMADol 50 MG tablet  Commonly known as:  ULTRAM  Take 1-2 tablets (50-100 mg total) by mouth every 6 (six) hours as needed (mild pain).       Follow-up Information    Follow up with [facility].   Why:  home  health physical therapy   Contact information:   [place] [address] [phone]       Follow up with Gearlean Alf, MD. Schedule an appointment as soon as possible for a visit on 09/19/2014.   Specialty:  Orthopedic Surgery   Why:  Call office at [phone] to setup appointment with Dr. Wynelle Link on Monday 09/19/2014   Contact information:   [address] [place] [address] [phone]       Signed: [name], PA-C Orthopaedic Surgery 09/09/2014, 8:37 AM

## 2014-09-09 NOTE — Progress Notes (Signed)
Shift change report included pt has a verbal order from [name] PA that pt is able to take her meds from home that are kept at the bedside.Pt states she feels more comfortable taking her own since she has multiple allergies.Pt is aware that she must inform the RN when any meds are taken so they can be documented in [facility].Pt also verbalizes that she agrees to Not take any controlled meds or other prn's from home.Aggie Moats D

## 2014-09-09 NOTE — Progress Notes (Signed)
Physical Therapy Treatment Patient Details Name: Crystal Ramsey MRN: [id number] DOB: 15-Nov-1935 Today's Date: 09/09/2014    History of Present Illness s/p   R   DA THA    PT Comments    Worked a lot with bed mobility for this is very challenging for patient. Noted great tightness in B abduction limited very little range of abduction and weakness in both LE for abduction, challenging bed mobility greatly. We tried many different methods from the right side and left side of the bed, leg lift , and other techniques, still needing minimal assist to get in and out of bed. And after this session the R hip was hurting and fatigued very much so. Ice applied. Will continue to work on this, pt is seeing some improvement.     Follow Up Recommendations  Home health PT     Equipment Recommendations  None recommended by PT    Recommendations for Other Services       Precautions / Restrictions Precautions Precautions: None Restrictions Weight Bearing Restrictions: No Other Position/Activity Restrictions: WBAT    Mobility  Bed Mobility Overal bed mobility: Needs Assistance Bed Mobility: Sit to Supine     Supine to sit: Min assist Sit to supine: Min assist   General bed mobility comments: worked a lot with bed mobility see session note for this  Transfers Overall transfer level: Needs assistance Equipment used: Rolling walker (2 wheeled) Transfers: Sit to/from Stand Sit to Stand: Supervision         General transfer comment: cues for hand placement and safety   Ambulation/Gait Ambulation/Gait assistance: Supervision Ambulation Distance (Feet): 90 Feet Assistive device: Rolling walker (2 wheeled) Gait Pattern/deviations: Step-to pattern     General Gait Details: for safety   Stairs            Wheelchair Mobility    Modified Rankin (Stroke Patients Only)       Balance                                    Cognition Arousal/Alertness:  Awake/alert Behavior During Therapy: WFL for tasks assessed/performed Overall Cognitive Status: Within Functional Limits for tasks assessed                      Exercises Total Joint Exercises Heel Slides: AAROM;Right;Supine Hip ABduction/ADduction: AAROM;Right;Supine    General Comments        Pertinent Vitals/Pain Pain Assessment: 0-10 Pain Score: 4  Pain Location: R hip area Pain Descriptors / Indicators: Aching Pain Intervention(s): Monitored during session;Ice applied    Home Living                      Prior Function            PT Goals (current goals can now be found in the care plan section) Acute Rehab PT Goals Patient Stated Goal: home tomorrow PT Goal Formulation: With patient Time For Goal Achievement: 09/15/14 Potential to Achieve Goals: Good Progress towards PT goals: Progressing toward goals    Frequency  7X/week    PT Plan Current plan remains appropriate    Co-evaluation             End of Session Equipment Utilized During Treatment: Gait belt Activity Tolerance: Patient limited by fatigue;Other (comment) (nausea) Patient left: with call bell/phone within reach;in bed     Time: 1287-8676 PT  Time Calculation (min) (ACUTE ONLY): 32 min  Charges:  $Gait Training: 8-22 mins $Therapeutic Activity: 8-22 mins                    G CodesClide Dales 2014-10-03, 5:54 PM Clide Dales, PT Pager: [phone] 2014/10/03

## 2014-09-09 NOTE — Progress Notes (Signed)
   Subjective: 2 Days Post-Op Procedure(s) (LRB): RIGHT TOTAL HIP ARTHROPLASTY ANTERIOR APPROACH (Right) Patient reports pain as mild.  Has some difficulty getting out of bed otherwise no complaints Plan is to go Home after hospital stay.  Objective: Vital signs in last 24 hours: Temp:  [97.6 F (36.4 C)-98.2 F (36.8 C)] 97.6 F (36.4 C) (04/29 0631) Pulse Rate:  [80-85] 81 (04/29 0631) Resp:  [16-20] 18 (04/29 0631) BP: (141-163)/(62-86) 163/68 mmHg (04/29 0631) SpO2:  [96 %-97 %] 96 % (04/29 0631)  Intake/Output from previous day:  Intake/Output Summary (Last 24 hours) at 09/09/14 0751 Last data filed at 09/08/14 2050  Gross per 24 hour  Intake   1640 ml  Output   1400 ml  Net    240 ml    Intake/Output this shift:    Labs:  Recent Labs  09/08/14 0410 09/09/14 0440  HGB 11.7* 11.0*    Recent Labs  09/08/14 0410 09/09/14 0440  WBC 5.9 9.0  RBC 3.84* 3.59*  HCT 35.7* 33.3*  PLT 212 210    Recent Labs  09/08/14 0410 09/09/14 0440  NA 137 136  K 3.1* 2.9*  CL 101 103  CO2 27 29  BUN 12 21  CREATININE 0.60 0.74  GLUCOSE 173* 113*  CALCIUM 8.3* 8.3*   No results for input(s): LABPT, INR in the last 72 hours.  EXAM General - Patient is Alert, Appropriate and Oriented Extremity - Neurologically intact Neurovascular intact No cellulitis present Compartment soft Dressing/Incision - clean, dry, no drainage Motor Function - intact, moving foot and toes well on exam.   Past Medical History  Diagnosis Date  . Arthritis   . Hypertension   . Lichen plano-pilaris   . Complication of anesthesia   . PONV (postoperative nausea and vomiting)   . Fibromyalgia     PT THINKS SHE HAS FIBROMYALGIA  . GERD (gastroesophageal reflux disease)   . Constipation, chronic   . High risk HPV infection 12/2012    Pap smear cytology normal with positive HR HPV  . Back pain     Assessment/Plan: 2 Days Post-Op Procedure(s) (LRB): RIGHT TOTAL HIP ARTHROPLASTY  ANTERIOR APPROACH (Right) Principal Problem:   OA (osteoarthritis) of hip   Discharge home with home health   DVT Prophylaxis - Xarelto Weight Bearing As Tolerated right Leg  Marland Reine V 09/09/2014, 7:51 AM

## 2014-09-09 NOTE — Progress Notes (Signed)
Physical Therapy Treatment Patient Details Name: Crystal Ramsey MRN: [id number] DOB: 01/20/1936 Today's Date: 09/09/2014    History of Present Illness s/p   R   DA THA    PT Comments    Pt struggling with bed mobility, pt does not feel comfortable going home today; informed charge nurse; will see how she does in second session  Follow Up Recommendations  Home health PT     Equipment Recommendations  None recommended by PT    Recommendations for Other Services       Precautions / Restrictions Precautions Precautions: None Restrictions Weight Bearing Restrictions: No Other Position/Activity Restrictions: WBAT    Mobility  Bed Mobility Overal bed mobility: Needs Assistance Bed Mobility: Sit to Supine       Sit to supine: Min assist   General bed mobility comments: incr time, struggling with bed mobility, pt became nauseous; attempted to instruct pt in various ways to self assit RLE onto bed but she was unable to complete; pt reports she can't  get her husband to help during the night  Transfers Overall transfer level: Needs assistance Equipment used: Rolling walker (2 wheeled) Transfers: Sit to/from Stand Sit to Stand: Supervision         General transfer comment: cues for hand placement and safety   Ambulation/Gait Ambulation/Gait assistance: Supervision Ambulation Distance (Feet): 80 Feet Assistive device: Rolling walker (2 wheeled) Gait Pattern/deviations: Step-to pattern;Trunk flexed     General Gait Details: for safety   Stairs Stairs: Yes Stairs assistance: Supervision Stair Management: Two rails;Step to pattern;Forwards Number of Stairs: 4 General stair comments: cues for sequence  Wheelchair Mobility    Modified Rankin (Stroke Patients Only)       Balance                                    Cognition Arousal/Alertness: Awake/alert Behavior During Therapy: WFL for tasks assessed/performed Overall Cognitive Status:  Within Functional Limits for tasks assessed                      Exercises      General Comments        Pertinent Vitals/Pain Pain Assessment: 0-10 Pain Score: 3  Pain Location: right hip Pain Descriptors / Indicators: Sore Pain Intervention(s): Limited activity within patient's tolerance;Monitored during session;Premedicated before session    Home Living                      Prior Function            PT Goals (current goals can now be found in the care plan section) Acute Rehab PT Goals Patient Stated Goal: home tomorrow PT Goal Formulation: With patient Time For Goal Achievement: 09/15/14 Potential to Achieve Goals: Good Progress towards PT goals: Progressing toward goals    Frequency  7X/week    PT Plan Current plan remains appropriate    Co-evaluation             End of Session Equipment Utilized During Treatment: Gait belt Activity Tolerance: Patient limited by fatigue;Other (comment) (nausea) Patient left: with call bell/phone within reach;in bed     Time: 0940-1010 PT Time Calculation (min) (ACUTE ONLY): 30 min  Charges:  $Gait Training: 8-22 mins $Therapeutic Activity: 8-22 mins                    G  Codes:      Kenyon Ana 09/09/2014, 10:14 AM

## 2014-09-10 LAB — CBC
HCT: 34 % — ABNORMAL LOW (ref 36.0–46.0)
HEMOGLOBIN: 11 g/dL — AB (ref 12.0–15.0)
MCH: 30.2 pg (ref 26.0–34.0)
MCHC: 32.4 g/dL (ref 30.0–36.0)
MCV: 93.4 fL (ref 78.0–100.0)
Platelets: 228 10*3/uL (ref 150–400)
RBC: 3.64 MIL/uL — ABNORMAL LOW (ref 3.87–5.11)
RDW: 13.9 % (ref 11.5–15.5)
WBC: 6.2 10*3/uL (ref 4.0–10.5)

## 2014-09-10 NOTE — Progress Notes (Signed)
Discharged from floor via w/c, belongings & daughter with pt. No changes in assessment. Kedra Mcglade, [employer]

## 2014-09-10 NOTE — Progress Notes (Signed)
    Subjective: 3 Days Post-Op Procedure(s) (LRB): RIGHT TOTAL HIP ARTHROPLASTY ANTERIOR APPROACH (Right) Patient reports pain as 2 on 0-10 scale.   Denies CP or SOB.  Voiding without difficulty. Positive flatus. Objective: Vital signs in last 24 hours: Temp:  [98.2 F (36.8 C)-98.6 F (37 C)] 98.4 F (36.9 C) (04/30 0559) Pulse Rate:  [76-81] 76 (04/30 0559) Resp:  [16-17] 16 (04/30 0559) BP: (140-155)/(69-78) 152/74 mmHg (04/30 0559) SpO2:  [96 %-98 %] 97 % (04/30 0559)  Intake/Output from previous day: 04/29 0701 - 04/30 0700 In: 960 [P.O.:960] Out: 1050 [Urine:1050] Intake/Output this shift:    Labs:  Recent Labs  09/08/14 0410 09/09/14 0440 09/10/14 0455  HGB 11.7* 11.0* 11.0*    Recent Labs  09/09/14 0440 09/10/14 0455  WBC 9.0 6.2  RBC 3.59* 3.64*  HCT 33.3* 34.0*  PLT 210 228    Recent Labs  09/08/14 0410 09/09/14 0440  NA 137 136  K 3.1* 2.9*  CL 101 103  CO2 27 29  BUN 12 21  CREATININE 0.60 0.74  GLUCOSE 173* 113*  CALCIUM 8.3* 8.3*   No results for input(s): LABPT, INR in the last 72 hours.  Physical Exam: Neurologically intact ABD soft Intact pulses distally Incision: dressing C/D/I and no drainage Compartment soft  Assessment/Plan: 3 Days Post-Op Procedure(s) (LRB): RIGHT TOTAL HIP ARTHROPLASTY ANTERIOR APPROACH (Right) Advance diet Up with therapy  Ok for d/c to home  Joeseph Verville D for Dr. Melina Schools [facility] [phone] 09/10/2014, 8:32 AM

## 2014-09-10 NOTE — Progress Notes (Signed)
Physical Therapy Treatment Patient Details Name: Crystal Ramsey MRN: [id number] DOB: January 09, 1936 Today's Date: 09/15/2014    History of Present Illness s/p   R   DA THA    PT Comments    Pt tolerated well , ready for DC from my standpoint.   Follow Up Recommendations        Equipment Recommendations  None recommended by PT    Recommendations for Other Services       Precautions / Restrictions Precautions Precautions: None Restrictions Weight Bearing Restrictions: No Other Position/Activity Restrictions: WBAT    Mobility  Bed Mobility   Bed Mobility: Sit to Supine     Supine to sit: Min assist Sit to supine: Min assist   General bed mobility comments: worded again fro teh right side of the bed to try to help pt get LE on bed but still needing assistance. tried strap as well. She did improve on getting out of hte bed, with little less assisance than yesterday. Educated Dtr as well on this bed mobility and possibilities for home.   Transfers Overall transfer level: Modified independent Equipment used: Rolling walker (2 wheeled) Transfers: Sit to/from Stand Sit to Stand: Supervision         General transfer comment: cues for hand placement and safety   Ambulation/Gait Ambulation/Gait assistance: Supervision Ambulation Distance (Feet): 90 Feet Assistive device: Rolling walker (2 wheeled) Gait Pattern/deviations: Step-through pattern     General Gait Details: for safety   Stairs   Stairs assistance:  (pt stated she was doing fine , no need to racticed steps again. )        Wheelchair Mobility    Modified Rankin (Stroke Patients Only)       Balance                                    Cognition Arousal/Alertness: Awake/alert Behavior During Therapy: WFL for tasks assessed/performed Overall Cognitive Status: Within Functional Limits for tasks assessed                      Exercises Total Joint Exercises Heel Slides:  AAROM;Right;Supine;10 reps Hip ABduction/ADduction: AAROM;Right;Supine;10 reps    General Comments        Pertinent Vitals/Pain Pain Assessment: 0-10 Pain Score: 4  Pain Location: r Hip area Pain Descriptors / Indicators: Aching Pain Intervention(s): Monitored during session;Ice applied    Home Living                      Prior Function            PT Goals (current goals can now be found in the care plan section) Progress towards PT goals: Progressing toward goals    Frequency  7X/week    PT Plan      Co-evaluation             End of Session Equipment Utilized During Treatment: Gait belt Activity Tolerance: Patient tolerated treatment well Patient left: in bed;with family/visitor present     Time: 1100-1135 PT Time Calculation (min) (ACUTE ONLY): 35 min  Charges:  $Gait Training: 8-22 mins $Therapeutic Activity: 8-22 mins                    G CodesClide Dales 15-Sep-2014, 1:11 PM  Clide Dales, PT Pager: [phone] 2014/09/15

## 2014-09-11 DIAGNOSIS — Z96641 Presence of right artificial hip joint: Secondary | ICD-10-CM | POA: Diagnosis not present

## 2014-09-11 DIAGNOSIS — I10 Essential (primary) hypertension: Secondary | ICD-10-CM | POA: Diagnosis not present

## 2014-09-11 DIAGNOSIS — M797 Fibromyalgia: Secondary | ICD-10-CM | POA: Diagnosis not present

## 2014-09-11 DIAGNOSIS — M1612 Unilateral primary osteoarthritis, left hip: Secondary | ICD-10-CM | POA: Diagnosis not present

## 2014-09-11 DIAGNOSIS — G8929 Other chronic pain: Secondary | ICD-10-CM | POA: Diagnosis not present

## 2014-09-11 DIAGNOSIS — Z471 Aftercare following joint replacement surgery: Secondary | ICD-10-CM | POA: Diagnosis not present

## 2014-09-13 DIAGNOSIS — G8929 Other chronic pain: Secondary | ICD-10-CM | POA: Diagnosis not present

## 2014-09-13 DIAGNOSIS — I10 Essential (primary) hypertension: Secondary | ICD-10-CM | POA: Diagnosis not present

## 2014-09-13 DIAGNOSIS — Z96641 Presence of right artificial hip joint: Secondary | ICD-10-CM | POA: Diagnosis not present

## 2014-09-13 DIAGNOSIS — M1612 Unilateral primary osteoarthritis, left hip: Secondary | ICD-10-CM | POA: Diagnosis not present

## 2014-09-13 DIAGNOSIS — Z471 Aftercare following joint replacement surgery: Secondary | ICD-10-CM | POA: Diagnosis not present

## 2014-09-13 DIAGNOSIS — M797 Fibromyalgia: Secondary | ICD-10-CM | POA: Diagnosis not present

## 2014-09-15 DIAGNOSIS — Z96641 Presence of right artificial hip joint: Secondary | ICD-10-CM | POA: Diagnosis not present

## 2014-09-15 DIAGNOSIS — M1612 Unilateral primary osteoarthritis, left hip: Secondary | ICD-10-CM | POA: Diagnosis not present

## 2014-09-15 DIAGNOSIS — G8929 Other chronic pain: Secondary | ICD-10-CM | POA: Diagnosis not present

## 2014-09-15 DIAGNOSIS — Z471 Aftercare following joint replacement surgery: Secondary | ICD-10-CM | POA: Diagnosis not present

## 2014-09-15 DIAGNOSIS — I10 Essential (primary) hypertension: Secondary | ICD-10-CM | POA: Diagnosis not present

## 2014-09-15 DIAGNOSIS — M797 Fibromyalgia: Secondary | ICD-10-CM | POA: Diagnosis not present

## 2014-09-19 DIAGNOSIS — M797 Fibromyalgia: Secondary | ICD-10-CM | POA: Diagnosis not present

## 2014-09-19 DIAGNOSIS — I10 Essential (primary) hypertension: Secondary | ICD-10-CM | POA: Diagnosis not present

## 2014-09-19 DIAGNOSIS — Z96641 Presence of right artificial hip joint: Secondary | ICD-10-CM | POA: Diagnosis not present

## 2014-09-19 DIAGNOSIS — G8929 Other chronic pain: Secondary | ICD-10-CM | POA: Diagnosis not present

## 2014-09-19 DIAGNOSIS — Z471 Aftercare following joint replacement surgery: Secondary | ICD-10-CM | POA: Diagnosis not present

## 2014-09-19 DIAGNOSIS — M1612 Unilateral primary osteoarthritis, left hip: Secondary | ICD-10-CM | POA: Diagnosis not present

## 2014-09-21 DIAGNOSIS — Z96641 Presence of right artificial hip joint: Secondary | ICD-10-CM | POA: Diagnosis not present

## 2014-09-21 DIAGNOSIS — Z471 Aftercare following joint replacement surgery: Secondary | ICD-10-CM | POA: Diagnosis not present

## 2014-09-22 DIAGNOSIS — M797 Fibromyalgia: Secondary | ICD-10-CM | POA: Diagnosis not present

## 2014-09-22 DIAGNOSIS — Z96641 Presence of right artificial hip joint: Secondary | ICD-10-CM | POA: Diagnosis not present

## 2014-09-22 DIAGNOSIS — I10 Essential (primary) hypertension: Secondary | ICD-10-CM | POA: Diagnosis not present

## 2014-09-22 DIAGNOSIS — Z471 Aftercare following joint replacement surgery: Secondary | ICD-10-CM | POA: Diagnosis not present

## 2014-09-22 DIAGNOSIS — G8929 Other chronic pain: Secondary | ICD-10-CM | POA: Diagnosis not present

## 2014-09-22 DIAGNOSIS — M1612 Unilateral primary osteoarthritis, left hip: Secondary | ICD-10-CM | POA: Diagnosis not present

## 2014-09-23 DIAGNOSIS — Z471 Aftercare following joint replacement surgery: Secondary | ICD-10-CM | POA: Diagnosis not present

## 2014-09-23 DIAGNOSIS — G8929 Other chronic pain: Secondary | ICD-10-CM | POA: Diagnosis not present

## 2014-09-23 DIAGNOSIS — Z96641 Presence of right artificial hip joint: Secondary | ICD-10-CM | POA: Diagnosis not present

## 2014-09-23 DIAGNOSIS — M797 Fibromyalgia: Secondary | ICD-10-CM | POA: Diagnosis not present

## 2014-09-23 DIAGNOSIS — I10 Essential (primary) hypertension: Secondary | ICD-10-CM | POA: Diagnosis not present

## 2014-09-23 DIAGNOSIS — M1612 Unilateral primary osteoarthritis, left hip: Secondary | ICD-10-CM | POA: Diagnosis not present

## 2014-09-29 DIAGNOSIS — Z471 Aftercare following joint replacement surgery: Secondary | ICD-10-CM | POA: Diagnosis not present

## 2014-09-29 DIAGNOSIS — Z96641 Presence of right artificial hip joint: Secondary | ICD-10-CM | POA: Diagnosis not present

## 2014-10-20 DIAGNOSIS — Z96641 Presence of right artificial hip joint: Secondary | ICD-10-CM | POA: Diagnosis not present

## 2014-10-20 DIAGNOSIS — Z471 Aftercare following joint replacement surgery: Secondary | ICD-10-CM | POA: Diagnosis not present

## 2014-10-24 DIAGNOSIS — H903 Sensorineural hearing loss, bilateral: Secondary | ICD-10-CM | POA: Diagnosis not present

## 2014-10-27 DIAGNOSIS — H25011 Cortical age-related cataract, right eye: Secondary | ICD-10-CM | POA: Diagnosis not present

## 2014-10-27 DIAGNOSIS — H2511 Age-related nuclear cataract, right eye: Secondary | ICD-10-CM | POA: Diagnosis not present

## 2014-10-27 DIAGNOSIS — Z961 Presence of intraocular lens: Secondary | ICD-10-CM | POA: Diagnosis not present

## 2014-11-04 DIAGNOSIS — I10 Essential (primary) hypertension: Secondary | ICD-10-CM | POA: Diagnosis not present

## 2014-11-07 ENCOUNTER — Other Ambulatory Visit: Payer: Self-pay

## 2014-11-21 DIAGNOSIS — D225 Melanocytic nevi of trunk: Secondary | ICD-10-CM | POA: Diagnosis not present

## 2014-11-21 DIAGNOSIS — L821 Other seborrheic keratosis: Secondary | ICD-10-CM | POA: Diagnosis not present

## 2014-11-21 DIAGNOSIS — L661 Lichen planopilaris: Secondary | ICD-10-CM | POA: Diagnosis not present

## 2014-11-21 DIAGNOSIS — D1801 Hemangioma of skin and subcutaneous tissue: Secondary | ICD-10-CM | POA: Diagnosis not present

## 2014-12-01 DIAGNOSIS — Z471 Aftercare following joint replacement surgery: Secondary | ICD-10-CM | POA: Diagnosis not present

## 2014-12-01 DIAGNOSIS — Z96641 Presence of right artificial hip joint: Secondary | ICD-10-CM | POA: Diagnosis not present

## 2015-01-02 DIAGNOSIS — R600 Localized edema: Secondary | ICD-10-CM | POA: Diagnosis not present

## 2015-01-02 DIAGNOSIS — I83893 Varicose veins of bilateral lower extremities with other complications: Secondary | ICD-10-CM | POA: Diagnosis not present

## 2015-01-02 DIAGNOSIS — I83813 Varicose veins of bilateral lower extremities with pain: Secondary | ICD-10-CM | POA: Diagnosis not present

## 2015-01-03 DIAGNOSIS — Z1231 Encounter for screening mammogram for malignant neoplasm of breast: Secondary | ICD-10-CM | POA: Diagnosis not present

## 2015-01-04 ENCOUNTER — Encounter: Payer: Self-pay | Admitting: Gynecology

## 2015-01-05 DIAGNOSIS — I83813 Varicose veins of bilateral lower extremities with pain: Secondary | ICD-10-CM | POA: Diagnosis not present

## 2015-01-24 ENCOUNTER — Other Ambulatory Visit (HOSPITAL_COMMUNITY)
Admission: RE | Admit: 2015-01-24 | Discharge: 2015-01-24 | Disposition: A | Discharge status: Home / Self Care | Payer: Medicare Other | Source: Ambulatory Visit | Attending: Gynecology | Admitting: Gynecology

## 2015-01-24 ENCOUNTER — Ambulatory Visit (INDEPENDENT_AMBULATORY_CARE_PROVIDER_SITE_OTHER): Payer: Medicare Other | Admitting: Gynecology

## 2015-01-24 ENCOUNTER — Encounter: Payer: Self-pay | Admitting: Gynecology

## 2015-01-24 VITALS — BP 136/84 | Ht 62.0 in | Wt 164.0 lb

## 2015-01-24 DIAGNOSIS — N762 Acute vulvitis: Secondary | ICD-10-CM

## 2015-01-24 DIAGNOSIS — Z9189 Other specified personal risk factors, not elsewhere classified: Secondary | ICD-10-CM

## 2015-01-24 DIAGNOSIS — Z124 Encounter for screening for malignant neoplasm of cervix: Secondary | ICD-10-CM | POA: Diagnosis not present

## 2015-01-24 DIAGNOSIS — N952 Postmenopausal atrophic vaginitis: Secondary | ICD-10-CM

## 2015-01-24 DIAGNOSIS — Z01419 Encounter for gynecological examination (general) (routine) without abnormal findings: Secondary | ICD-10-CM

## 2015-01-24 MED ORDER — CLOBETASOL PROPIONATE 0.05 % EX CREA: TOPICAL_CREAM | CUTANEOUS | Status: DC

## 2015-01-24 NOTE — Patient Instructions (Signed)

## 2015-01-24 NOTE — Progress Notes (Signed)
Crystal Ramsey August 10, 1935 [id number]        79 y.o.  G2P1011 for breast and pelvic exam. Several issues noted below  Past medical history,surgical history, problem list, medications, allergies, family history and social history were all reviewed and documented as reviewed in the EPIC chart.  ROS:  Performed with pertinent positives and negatives included in the history, assessment and plan.   Additional significant findings :  none   Exam: [name] Vitals:   01/24/15 1425  BP: 136/84  Height: 5\' 2"  (1.575 m)  Weight: 164 lb (74.39 kg)   General appearance:  Normal affect, orientation and appearance. Skin: Grossly normal HEENT: Without gross lesions.  No cervical or supraclavicular adenopathy. Thyroid normal.  Lungs:  Clear without wheezing, rales or rhonchi Cardiac: RR, without RMG Abdominal:  Soft, nontender, without masses, guarding, rebound, organomegaly or hernia Breasts:  Examined lying and sitting without masses, retractions, discharge or axillary adenopathy. Pelvic:  Ext/BUS/vagina with atrophic changes, Pap smear of vaginal cuff done  Adnexa  Without masses or tenderness    Anus and perineum  Normal   Rectovaginal  Normal sphincter tone without palpated masses or tenderness.    Assessment/Plan:  79 y.o. G29P1011 female for breast and pelvic exam.   1. Postmenopausal/atrophic genital changes. Patient without significant symptoms of hot flushes, night sweats or vaginal dryness. Status post TVH in the past. Continue to monitor and report any issues. 2. Occasional vulvitis with irritation/itching. Has been using Temovate 0.05% cream intermittently for years. Has run out and I refilled her 1 tube with 1 refill. Exam is without evidence of lichen sclerosis or other pathology. Does not use the cream very often. 3. 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 Pap smear of vaginal cuff done today. Continue with annual cytology. 4. Mammography 12/2014. Continue with annual mammography. SBE  monthly reviewed. 5. DEXA 2015 normal. Plan repeat at 5 year interval. 6. Colonoscopy 8 years ago with planned repeat interval 10 years. 7. Health maintenance. No routine lab work done as patient reports this done at her primary physician's office. Follow up 1 year, sooner as needed.   Anastasio Auerbach MD, 2:46 PM 01/24/2015

## 2015-01-24 NOTE — Addendum Note (Signed)
Addended by: Nelva Nay on: 01/24/2015 02:51 PM   Modules accepted: Orders

## 2015-01-25 LAB — URINALYSIS W MICROSCOPIC + REFLEX CULTURE
BILIRUBIN URINE: NEGATIVE
Bacteria, UA: NONE SEEN [HPF]
Casts: NONE SEEN [LPF]
Crystals: NONE SEEN [HPF]
GLUCOSE, UA: NEGATIVE
HGB URINE DIPSTICK: NEGATIVE
KETONES UR: NEGATIVE
Nitrite: NEGATIVE
PH: 6 (ref 5.0–8.0)
Protein, ur: NEGATIVE
Specific Gravity, Urine: 1.017 (ref 1.001–1.035)
Yeast: NONE SEEN [HPF]

## 2015-01-26 DIAGNOSIS — I83813 Varicose veins of bilateral lower extremities with pain: Secondary | ICD-10-CM | POA: Diagnosis not present

## 2015-01-26 LAB — CYTOLOGY - PAP

## 2015-01-26 LAB — URINE CULTURE

## 2015-03-01 DIAGNOSIS — Z23 Encounter for immunization: Secondary | ICD-10-CM | POA: Diagnosis not present

## 2015-05-26 DIAGNOSIS — I10 Essential (primary) hypertension: Secondary | ICD-10-CM | POA: Diagnosis not present

## 2015-05-26 DIAGNOSIS — Z1389 Encounter for screening for other disorder: Secondary | ICD-10-CM | POA: Diagnosis not present

## 2015-05-26 DIAGNOSIS — Z0000 Encounter for general adult medical examination without abnormal findings: Secondary | ICD-10-CM | POA: Diagnosis not present

## 2015-08-14 DIAGNOSIS — Z96641 Presence of right artificial hip joint: Secondary | ICD-10-CM | POA: Diagnosis not present

## 2015-08-14 DIAGNOSIS — Z471 Aftercare following joint replacement surgery: Secondary | ICD-10-CM | POA: Diagnosis not present

## 2015-09-28 DIAGNOSIS — M1612 Unilateral primary osteoarthritis, left hip: Secondary | ICD-10-CM | POA: Diagnosis not present

## 2015-10-04 DIAGNOSIS — M25571 Pain in right ankle and joints of right foot: Secondary | ICD-10-CM | POA: Diagnosis not present

## 2015-10-04 DIAGNOSIS — M2141 Flat foot [pes planus] (acquired), right foot: Secondary | ICD-10-CM | POA: Diagnosis not present

## 2015-10-26 DIAGNOSIS — Z0100 Encounter for examination of eyes and vision without abnormal findings: Secondary | ICD-10-CM | POA: Diagnosis not present

## 2015-10-26 DIAGNOSIS — Z961 Presence of intraocular lens: Secondary | ICD-10-CM | POA: Diagnosis not present

## 2015-10-26 DIAGNOSIS — H25011 Cortical age-related cataract, right eye: Secondary | ICD-10-CM | POA: Diagnosis not present

## 2015-10-26 DIAGNOSIS — H2511 Age-related nuclear cataract, right eye: Secondary | ICD-10-CM | POA: Diagnosis not present

## 2015-11-21 DIAGNOSIS — L661 Lichen planopilaris: Secondary | ICD-10-CM | POA: Diagnosis not present

## 2015-11-21 DIAGNOSIS — D225 Melanocytic nevi of trunk: Secondary | ICD-10-CM | POA: Diagnosis not present

## 2015-11-21 DIAGNOSIS — D1801 Hemangioma of skin and subcutaneous tissue: Secondary | ICD-10-CM | POA: Diagnosis not present

## 2015-11-21 DIAGNOSIS — L821 Other seborrheic keratosis: Secondary | ICD-10-CM | POA: Diagnosis not present

## 2015-11-23 DIAGNOSIS — I10 Essential (primary) hypertension: Secondary | ICD-10-CM | POA: Diagnosis not present

## 2015-11-23 DIAGNOSIS — E876 Hypokalemia: Secondary | ICD-10-CM | POA: Diagnosis not present

## 2015-12-01 NOTE — Progress Notes (Signed)
Please place orders in EPIC as patient has a Pre-operative appointment with the nurse at [facility] on 12/07/2015 at 1000 am ! Thank you!

## 2015-12-04 ENCOUNTER — Ambulatory Visit: Payer: Self-pay | Admitting: Orthopedic Surgery

## 2015-12-05 ENCOUNTER — Encounter (HOSPITAL_COMMUNITY): Payer: Self-pay

## 2015-12-06 NOTE — Patient Instructions (Addendum)
Crystal Ramsey  12/06/2015   Your procedure is scheduled on: Wednesday 12/13/2015  Report to [facility] Main  Entrance take [place]  elevators to 3rd floor to  [place] at  [place]  AM.  Call this number if you have problems the morning of surgery [phone]   Remember: ONLY 1 PERSON MAY GO WITH YOU TO SHORT STAY TO GET  READY MORNING OF [place].   Do not eat food or drink liquids :After Midnight.     Take these medicines the morning of surgery with A SIP OF WATER: Omeprazole                                You may not have any metal on your body including hair pins and              piercings  Do not wear jewelry, make-up, lotions, powders or perfumes, deodorant             Do not wear nail polish.  Do not shave  48 hours prior to surgery.              Men may shave face and neck.   Do not bring valuables to the hospital. [place].  Contacts, dentures or bridgework may not be worn into surgery.  Leave suitcase in the car. After surgery it may be brought to your room.                Please read over the following fact sheets you were given: _____________________________________________________________________             [facility] - Preparing for Surgery Before surgery, you can play an important role.  Because skin is not sterile, your skin needs to be as free of germs as possible.  You can reduce the number of germs on your skin by washing with CHG (chlorahexidine gluconate) soap before surgery.  CHG is an antiseptic cleaner which kills germs and bonds with the skin to continue killing germs even after washing. Please DO NOT use if you have an allergy to CHG or antibacterial soaps.  If your skin becomes reddened/irritated stop using the CHG and inform your nurse when you arrive at Short Stay. Do not shave (including legs and underarms) for at least 48 hours prior to the first CHG shower.  You  may shave your face/neck. Please follow these instructions carefully:       USE DIAL SOAP TO SHOWER WITH NIGHT BEFORE AND DAY OF SURGERY!  1.  Shower with CHG Soap the night before surgery and the  morning of Surgery.  2.  If you choose to wash your hair, wash your hair first as usual with your  normal  shampoo.  3.  After you shampoo, rinse your hair and body thoroughly to remove the  shampoo.                           4.  Use CHG as you would any other liquid soap.  You can apply chg directly  to the skin and wash  Gently with a scrungie or clean washcloth.  5.  Apply the CHG Soap to your body ONLY FROM THE NECK DOWN.   Do not use on face/ open                           Wound or open sores. Avoid contact with eyes, ears mouth and genitals (private parts).                       Wash face,  Genitals (private parts) with your normal soap.             6.  Wash thoroughly, paying special attention to the area where your surgery  will be performed.  7.  Thoroughly rinse your body with warm water from the neck down.  8.  DO NOT shower/wash with your normal soap after using and rinsing off  the CHG Soap.                9.  Pat yourself dry with a clean towel.            10.  Wear clean pajamas.            11.  Place clean sheets on your bed the night of your first shower and do not  sleep with pets. Day of Surgery : Do not apply any lotions/deodorants the morning of surgery.  Please wear clean clothes to the hospital/surgery center.  FAILURE TO FOLLOW THESE INSTRUCTIONS MAY RESULT IN THE CANCELLATION OF YOUR SURGERY PATIENT SIGNATURE_________________________________  NURSE SIGNATURE__________________________________  ________________________________________________________________________   Adam Phenix  An incentive spirometer is a tool that can help keep your lungs clear and active. This tool measures how well you are filling your lungs with each breath. Taking  long deep breaths may help reverse or decrease the chance of developing breathing (pulmonary) problems (especially infection) following:  A long period of time when you are unable to move or be active. BEFORE THE PROCEDURE   If the spirometer includes an indicator to show your best effort, your nurse or respiratory therapist will set it to a desired goal.  If possible, sit up straight or lean slightly forward. Try not to slouch.  Hold the incentive spirometer in an upright position. INSTRUCTIONS FOR USE  1. Sit on the edge of your bed if possible, or sit up as far as you can in bed or on a chair. 2. Hold the incentive spirometer in an upright position. 3. Breathe out normally. 4. Place the mouthpiece in your mouth and seal your lips tightly around it. 5. Breathe in slowly and as deeply as possible, raising the piston or the ball toward the top of the column. 6. Hold your breath for 3-5 seconds or for as long as possible. Allow the piston or ball to fall to the bottom of the column. 7. Remove the mouthpiece from your mouth and breathe out normally. 8. Rest for a few seconds and repeat Steps 1 through 7 at least 10 times every 1-2 hours when you are awake. Take your time and take a few normal breaths between deep breaths. 9. The spirometer may include an indicator to show your best effort. Use the indicator as a goal to work toward during each repetition. 10. After each set of 10 deep breaths, practice coughing to be sure your lungs are clear. If you have an incision (the cut made at the time of surgery),  support your incision when coughing by placing a pillow or rolled up towels firmly against it. Once you are able to get out of bed, walk around indoors and cough well. You may stop using the incentive spirometer when instructed by your caregiver.  RISKS AND COMPLICATIONS  Take your time so you do not get dizzy or light-headed.  If you are in pain, you may need to take or ask for pain  medication before doing incentive spirometry. It is harder to take a deep breath if you are having pain. AFTER USE  Rest and breathe slowly and easily.  It can be helpful to keep track of a log of your progress. Your caregiver can provide you with a simple table to help with this. If you are using the spirometer at home, follow these instructions: Ray IF:   You are having difficultly using the spirometer.  You have trouble using the spirometer as often as instructed.  Your pain medication is not giving enough relief while using the spirometer.  You develop fever of 100.5 F (38.1 C) or higher. SEEK IMMEDIATE MEDICAL CARE IF:   You cough up bloody sputum that had not been present before.  You develop fever of 102 F (38.9 C) or greater.  You develop worsening pain at or near the incision site. MAKE SURE YOU:   Understand these instructions.  Will watch your condition.  Will get help right away if you are not doing well or get worse. Document Released: 09/09/2006 Document Revised: 07/22/2011 Document Reviewed: 11/10/2006 Exit[employer] Patient Information 2014 [employer], [place].   ________________________________________________________________________  WHAT IS A BLOOD TRANSFUSION? Blood Transfusion Information  A transfusion is the replacement of blood or some of its parts. Blood is made up of multiple cells which provide different functions.  Red blood cells carry oxygen and are used for blood loss replacement.  White blood cells fight against infection.  Platelets control bleeding.  Plasma helps clot blood.  Other blood products are available for specialized needs, such as hemophilia or other clotting disorders. BEFORE THE TRANSFUSION  Who gives blood for transfusions?   Healthy volunteers who are fully evaluated to make sure their blood is safe. This is blood bank blood. Transfusion therapy is the safest it has ever been in the practice of medicine.  Before blood is taken from a donor, a complete history is taken to make sure that person has no history of diseases nor engages in risky social behavior (examples are intravenous drug use or sexual activity with multiple partners). The donor's travel history is screened to minimize risk of transmitting infections, such as malaria. The donated blood is tested for signs of infectious diseases, such as HIV and hepatitis. The blood is then tested to be sure it is compatible with you in order to minimize the chance of a transfusion reaction. If you or a relative donates blood, this is often done in anticipation of surgery and is not appropriate for emergency situations. It takes many days to process the donated blood. RISKS AND COMPLICATIONS Although transfusion therapy is very safe and saves many lives, the main dangers of transfusion include:   Getting an infectious disease.  Developing a transfusion reaction. This is an allergic reaction to something in the blood you were given. Every precaution is taken to prevent this. The decision to have a blood transfusion has been considered carefully by your caregiver before blood is given. Blood is not given unless the benefits outweigh the risks. AFTER THE TRANSFUSION  Right after receiving a blood transfusion, you will usually feel much better and more energetic. This is especially true if your red blood cells have gotten low (anemic). The transfusion raises the level of the red blood cells which carry oxygen, and this usually causes an energy increase.  The nurse administering the transfusion will monitor you carefully for complications. HOME CARE INSTRUCTIONS  No special instructions are needed after a transfusion. You may find your energy is better. Speak with your caregiver about any limitations on activity for underlying diseases you may have. SEEK MEDICAL CARE IF:   Your condition is not improving after your transfusion.  You develop redness or  irritation at the intravenous (IV) site. SEEK IMMEDIATE MEDICAL CARE IF:  Any of the following symptoms occur over the next 12 hours:  Shaking chills.  You have a temperature by mouth above 102 F (38.9 C), not controlled by medicine.  Chest, back, or muscle pain.  People around you feel you are not acting correctly or are confused.  Shortness of breath or difficulty breathing.  Dizziness and fainting.  You get a rash or develop hives.  You have a decrease in urine output.  Your urine turns a dark color or changes to pink, red, or brown. Any of the following symptoms occur over the next 10 days:  You have a temperature by mouth above 102 F (38.9 C), not controlled by medicine.  Shortness of breath.  Weakness after normal activity.  The white part of the eye turns yellow (jaundice).  You have a decrease in the amount of urine or are urinating less often.  Your urine turns a dark color or changes to pink, red, or brown. Document Released: 04/26/2000 Document Revised: 07/22/2011 Document Reviewed: 12/14/2007 [facility] Patient Information 2014 [place], [place].  _______________________________________________________________________

## 2015-12-07 ENCOUNTER — Ambulatory Visit: Payer: Self-pay | Admitting: Orthopedic Surgery

## 2015-12-07 ENCOUNTER — Other Ambulatory Visit: Payer: Self-pay

## 2015-12-07 ENCOUNTER — Encounter (HOSPITAL_COMMUNITY)
Admission: RE | Admit: 2015-12-07 | Discharge: 2015-12-07 | Disposition: A | Discharge status: Home / Self Care | Payer: Medicare Other | Source: Ambulatory Visit | Attending: Orthopedic Surgery | Admitting: Orthopedic Surgery

## 2015-12-07 ENCOUNTER — Encounter (HOSPITAL_COMMUNITY): Payer: Self-pay

## 2015-12-07 DIAGNOSIS — Z96651 Presence of right artificial knee joint: Secondary | ICD-10-CM | POA: Diagnosis not present

## 2015-12-07 DIAGNOSIS — Z96641 Presence of right artificial hip joint: Secondary | ICD-10-CM | POA: Diagnosis not present

## 2015-12-07 DIAGNOSIS — Z0183 Encounter for blood typing: Secondary | ICD-10-CM | POA: Diagnosis not present

## 2015-12-07 DIAGNOSIS — I10 Essential (primary) hypertension: Secondary | ICD-10-CM | POA: Insufficient documentation

## 2015-12-07 DIAGNOSIS — M1612 Unilateral primary osteoarthritis, left hip: Secondary | ICD-10-CM | POA: Diagnosis not present

## 2015-12-07 DIAGNOSIS — Z01812 Encounter for preprocedural laboratory examination: Secondary | ICD-10-CM | POA: Diagnosis not present

## 2015-12-07 DIAGNOSIS — Z79899 Other long term (current) drug therapy: Secondary | ICD-10-CM | POA: Diagnosis not present

## 2015-12-07 DIAGNOSIS — Z01818 Encounter for other preprocedural examination: Secondary | ICD-10-CM | POA: Insufficient documentation

## 2015-12-07 DIAGNOSIS — R9431 Abnormal electrocardiogram [ECG] [EKG]: Secondary | ICD-10-CM | POA: Insufficient documentation

## 2015-12-07 HISTORY — DX: Family history of other specified conditions: Z84.89

## 2015-12-07 LAB — COMPREHENSIVE METABOLIC PANEL
ALBUMIN: 4.4 g/dL (ref 3.5–5.0)
ALK PHOS: 77 U/L (ref 38–126)
ALT: 15 U/L (ref 14–54)
AST: 22 U/L (ref 15–41)
Anion gap: 7 (ref 5–15)
BUN: 20 mg/dL (ref 6–20)
CHLORIDE: 91 mmol/L — AB (ref 101–111)
CO2: 32 mmol/L (ref 22–32)
CREATININE: 0.71 mg/dL (ref 0.44–1.00)
Calcium: 9.5 mg/dL (ref 8.9–10.3)
GFR calc non Af Amer: >60 mL/min (ref >60)
GLUCOSE: 90 mg/dL (ref 65–99)
Potassium: 3.6 mmol/L (ref 3.5–5.1)
SODIUM: 130 mmol/L — AB (ref 135–145)
Total Bilirubin: 1 mg/dL (ref 0.3–1.2)
Total Protein: 7.4 g/dL (ref 6.5–8.1)

## 2015-12-07 LAB — CBC
HCT: 37.1 % (ref 36.0–46.0)
HEMOGLOBIN: 12.7 g/dL (ref 12.0–15.0)
MCH: 31.4 pg (ref 26.0–34.0)
MCHC: 34.2 g/dL (ref 30.0–36.0)
MCV: 91.8 fL (ref 78.0–100.0)
PLATELETS: 272 10*3/uL (ref 150–400)
RBC: 4.04 MIL/uL (ref 3.87–5.11)
RDW: 13 % (ref 11.5–15.5)
WBC: 4.4 10*3/uL (ref 4.0–10.5)

## 2015-12-07 LAB — URINALYSIS, ROUTINE W REFLEX MICROSCOPIC
Bilirubin Urine: NEGATIVE
Glucose, UA: NEGATIVE mg/dL
Hgb urine dipstick: NEGATIVE
Ketones, ur: NEGATIVE mg/dL
NITRITE: NEGATIVE
PH: 6.5 (ref 5.0–8.0)
Protein, ur: NEGATIVE mg/dL
SPECIFIC GRAVITY, URINE: 1.012 (ref 1.005–1.030)

## 2015-12-07 LAB — URINE MICROSCOPIC-ADD ON

## 2015-12-07 LAB — ABO/RH: ABO/RH(D): O POS

## 2015-12-07 LAB — PROTIME-INR
INR: 0.98
PROTHROMBIN TIME: 13 s (ref 11.4–15.2)

## 2015-12-07 LAB — SURGICAL PCR SCREEN
MRSA, PCR: NEGATIVE
Staphylococcus aureus: NEGATIVE

## 2015-12-07 LAB — APTT: APTT: 34 s (ref 24–36)

## 2015-12-07 NOTE — Progress Notes (Signed)
08/25/2015- Pre-operative clearance from Dr. Laurann Montana on chart.

## 2015-12-07 NOTE — H&P (Signed)
Crystal Ramsey DOB: 12/22/1935 Married / Language: English / Race: White Female Date of Admission:  12/13/2015 CC:  Left Hip Pain History of Present Illness  The patient is a 80 year old female who comes in today for a preoperative History and Physical. The patient is scheduled for a left total hip arthroplasty (anterior) to be performed by Dr. Dione Plover. Aluisio, MD at [facility] on 12/13/2015. The patient is a 80 year old female who presented for follow up of their hip. The patient is being followed for their left hip pain and osteoarthritis. Symptoms reported include: pain, aching, stiffness and difficulty ambulating. The patient feels that they are doing poorly. Current treatment includes: relative rest. The following medication has been used for pain control: Hydrocodone. The patient has reported only temporary improvement of their symptoms with: Cortisone injections (but really only helped for about three weeks). She is ready to get the left hip fixed at this time. The intraarticular injection barely helped. They have been treated conservatively in the past for the above stated problem and despite conservative measures, they continue to have progressive pain and severe functional limitations and dysfunction. They have failed non-operative management including home exercise, medications, and injections. It is felt that they would benefit from undergoing total joint replacement. Risks and benefits of the procedure have been discussed with the patient and they elect to proceed with surgery. There are no active contraindications to surgery such as ongoing infection or rapidly progressive neurological disease.  Problem List/Past Medical Problems Reconciled  Lumbosacral spondylosis (M47.817)  Pain in joint involving right ankle and foot (M25.571)  Aftercare following right hip joint replacement surgery (Z47.1)  Lumbar disc degeneration (M51.36)  Bursitis of left hip (M70.72)  Primary  osteoarthritis of right hip (M16.11)  Status post right hip replacement [id number]  Status post total right knee replacement (Z96.651)  Pes planus of right foot (M21.41)  Bronchitis  Urinary Tract Infection  Degenerative Disc Disease  Scarlet Fever  Streptococcus Infections  Fibroadenoma Breast Disease  Chronic Pain  Autoimmune disorder  Varicose veins  Osteoarthritis  Gastroesophageal Reflux Disease  High blood pressure  Fibromyalgia  Cataract  Impaired Hearing  Depression  Measles  Mumps  Menopause  Osteoarthrosis NOS, lower leg (715.96)  Allergies Chlorhexidine *PHARMACEUTICAL ADJUVANTS*  Chlorhexidine Gluconate *Antiseptics & Disinfectants**  chlorascrub Dyes  eye exam dyes Zithromax *MACROLIDES*  Demerol *ANALGESICS - OPIOID*  Nausea, Vomiting. Penicillins  young adulthood reaction - does not remember Sulfa Drugs  unknown reaction - does not remember Dilaudid *ANALGESICS - OPIOID*  Nausea, Vomiting.  Family History Hypertension  father and sister First Degree Relatives  Osteoporosis  sister Osteoarthritis  father, sister, brother and grandmother mothers side Cancer  First Degree Relatives. mother, sister and brother  Social History  Living situation  live with spouse Tobacco / smoke exposure  yes outdoors only Alcohol use  current drinker; drinks wine; 2-5 oz. Pain Contract  no Number of flights of stairs before winded  less than 1 Children  1 Illicit drug use  no Drug/Alcohol Rehab (Currently)  no Current work status  retired Tobacco use  Former smoker. Exercise  Exercises rarely Previously in rehab  no Marital status  married Advance Directives  Living Will, Healthcare POA [place] is to go home following the Left TH-AA to be performed on 12/13/2015.  Medication History (Alezandrew L. Perkins, III PA-C; 12/07/2015 2:22 PM) Amiloride-Hydrochlorothiazide (5-50MG  Tablet, Oral) Active. (1/2  tablet) Hydrocodone-Acetaminophen (7.5-325MG  Tablet, Oral) Active. PriLOSEC (  20MG  Capsule DR, Oral) Active. (every other day) Tums (Oral) Specific strength unknown - Active. (3 500mg  tablets per day) Clobetasol Propionate (0.05% Cream, External as needed) Active.  Past Surgical History Total Knee Replacement - Right  Date: 05/25/2012. Dilation and Curettage of Uterus  Cataract Surgery  left Tonsillectomy  Hysterectomy  partial (non-cancerous) Breast Biopsy  right Total Hip Replacement - Right   Review of Systems General Not Present- Chills, Fatigue, Fever, Memory Loss, Night Sweats, Weight Gain and Weight Loss. Skin Present- Itching. Not Present- Eczema, Hives, Lesions and Rash. HEENT Present- Hearing Loss. Not Present- Dentures, Double Vision, Headache, Tinnitus and Visual Loss. Respiratory Not Present- Allergies, Chronic Cough, Coughing up blood, Shortness of breath at rest and Shortness of breath with exertion. Cardiovascular Not Present- Chest Pain, Difficulty Breathing Lying Down, Murmur, Palpitations, Racing/skipping heartbeats and Swelling. Gastrointestinal Present- Constipation and Heartburn. Not Present- Abdominal Pain, Bloody Stool, Diarrhea, Difficulty Swallowing, Jaundice, Loss of appetitie, Nausea and Vomiting. Female Genitourinary Present- Urinating at Night. Not Present- Blood in Urine, Discharge, Flank Pain, Incontinence, Painful Urination, Urgency, Urinary frequency, Urinary Retention and Weak urinary stream. Musculoskeletal Present- Back Pain, Joint Pain and Morning Stiffness. Not Present- Joint Swelling, Muscle Pain, Muscle Weakness and Spasms. Neurological Not Present- Blackout spells, Difficulty with balance, Dizziness, Paralysis, Tremor and Weakness. Psychiatric Not Present- Insomnia.  Vitals  Weight: 161 lb Height: 61.25in Weight was reported by patient. Height was reported by patient. Body Surface Area: 1.73 m Body Mass Index: 30.17 kg/m   Pulse: 64 (Regular)  BP: 148/86 (Sitting, Left Arm, Standard)   Physical Exam General Mental Status -Alert, cooperative and good historian. General Appearance-pleasant, Not in acute distress. Orientation-Oriented X3. Build & Nutrition-Well nourished and Well developed.  Head and Neck Head-normocephalic, atraumatic . Neck Global Assessment - supple, no bruit auscultated on the right, no bruit auscultated on the left.  Eye Vision-Wears corrective lenses. Pupil - Bilateral-Regular and Round. Motion - Bilateral-EOMI.  Chest and Lung Exam Auscultation Breath sounds - clear at anterior chest wall and clear at posterior chest wall. Adventitious sounds - No Adventitious sounds.  Cardiovascular Auscultation Rhythm - Regular rate and rhythm. Heart Sounds - S1 WNL and S2 WNL. Murmurs & Other Heart Sounds - Auscultation of the heart reveals - No Murmurs.  Abdomen Palpation/Percussion Tenderness - Abdomen is non-tender to palpation. Rigidity (guarding) - Abdomen is soft. Auscultation Auscultation of the abdomen reveals - Bowel sounds normal.  Female Genitourinary Note: Not done, not pertinent to present illness   Musculoskeletal Note: On exam, she is in no distress. Left hip can be flexed to about 90, no internal rotation, about 10 external rotation, 10 abduction.  RADIOGRAPHS We reviewed her x-rays AP pelvis and lateral of the hip and she has severe bone on bone arthritis of the left hip.  Assessment & Plan Primary osteoarthritis of left hip (M16.12) Status post right hip replacement [id number]) Status post total right knee replacement [id number])  Note:Surgical Plans: Left Total Hip Replacement - Anterior Approach  Disposition: Home  PCP: Dr. Laurann Montana - Patient has been seen preoperatively and felt to be stable for surgery.  IV TXA  Anesthesia Issues: Nausea and Vomiting Postop  Signed electronically by Ok Edwards, III PA-C

## 2015-12-13 ENCOUNTER — Inpatient Hospital Stay (HOSPITAL_COMMUNITY): Payer: Medicare Other

## 2015-12-13 ENCOUNTER — Inpatient Hospital Stay (HOSPITAL_COMMUNITY): Payer: Medicare Other | Admitting: Anesthesiology

## 2015-12-13 ENCOUNTER — Inpatient Hospital Stay (HOSPITAL_COMMUNITY)
Admission: RE | Admit: 2015-12-13 | Discharge: 2015-12-15 | DRG: 470 | Disposition: A | Discharge status: Home / Self Care | Payer: Medicare Other | Source: Ambulatory Visit | Attending: Orthopedic Surgery | Admitting: Orthopedic Surgery

## 2015-12-13 ENCOUNTER — Encounter (HOSPITAL_COMMUNITY): Payer: Self-pay

## 2015-12-13 ENCOUNTER — Encounter (HOSPITAL_COMMUNITY)
Admission: RE | Disposition: A | Discharge status: Home / Self Care | Payer: Self-pay | Source: Ambulatory Visit | Attending: Orthopedic Surgery

## 2015-12-13 DIAGNOSIS — Z888 Allergy status to other drugs, medicaments and biological substances status: Secondary | ICD-10-CM | POA: Diagnosis not present

## 2015-12-13 DIAGNOSIS — M797 Fibromyalgia: Secondary | ICD-10-CM | POA: Diagnosis present

## 2015-12-13 DIAGNOSIS — Z87891 Personal history of nicotine dependence: Secondary | ICD-10-CM

## 2015-12-13 DIAGNOSIS — Z885 Allergy status to narcotic agent status: Secondary | ICD-10-CM | POA: Diagnosis not present

## 2015-12-13 DIAGNOSIS — I10 Essential (primary) hypertension: Secondary | ICD-10-CM | POA: Diagnosis present

## 2015-12-13 DIAGNOSIS — Z96642 Presence of left artificial hip joint: Secondary | ICD-10-CM | POA: Diagnosis not present

## 2015-12-13 DIAGNOSIS — K5909 Other constipation: Secondary | ICD-10-CM | POA: Diagnosis present

## 2015-12-13 DIAGNOSIS — M1612 Unilateral primary osteoarthritis, left hip: Secondary | ICD-10-CM | POA: Diagnosis present

## 2015-12-13 DIAGNOSIS — L661 Lichen planopilaris: Secondary | ICD-10-CM | POA: Diagnosis present

## 2015-12-13 DIAGNOSIS — K219 Gastro-esophageal reflux disease without esophagitis: Secondary | ICD-10-CM | POA: Diagnosis present

## 2015-12-13 DIAGNOSIS — M169 Osteoarthritis of hip, unspecified: Secondary | ICD-10-CM | POA: Diagnosis present

## 2015-12-13 DIAGNOSIS — Z471 Aftercare following joint replacement surgery: Secondary | ICD-10-CM | POA: Diagnosis not present

## 2015-12-13 DIAGNOSIS — M5136 Other intervertebral disc degeneration, lumbar region: Secondary | ICD-10-CM | POA: Diagnosis present

## 2015-12-13 DIAGNOSIS — Z96649 Presence of unspecified artificial hip joint: Secondary | ICD-10-CM

## 2015-12-13 DIAGNOSIS — Z880 Allergy status to penicillin: Secondary | ICD-10-CM | POA: Diagnosis not present

## 2015-12-13 DIAGNOSIS — Z882 Allergy status to sulfonamides status: Secondary | ICD-10-CM | POA: Diagnosis not present

## 2015-12-13 HISTORY — PX: TOTAL HIP ARTHROPLASTY: SHX124

## 2015-12-13 LAB — TYPE AND SCREEN
ABO/RH(D): O POS
Antibody Screen: NEGATIVE

## 2015-12-13 SURGERY — ARTHROPLASTY, HIP, TOTAL, ANTERIOR APPROACH
Anesthesia: Spinal | Site: Hip | Laterality: Left

## 2015-12-13 MED ORDER — PROPOFOL 10 MG/ML IV BOLUS
INTRAVENOUS | Status: AC
  Filled 2015-12-13: qty 60

## 2015-12-13 MED ORDER — ONDANSETRON HCL 4 MG PO TABS: 4.0000 mg | ORAL_TABLET | Freq: Four times a day (QID) | ORAL | Status: DC | PRN

## 2015-12-13 MED ORDER — CEFAZOLIN SODIUM-DEXTROSE 2-4 GM/100ML-% IV SOLN
2.0000 g | INTRAVENOUS | Status: AC
  Administered 2015-12-13: 2 g via INTRAVENOUS
  Filled 2015-12-13: qty 100

## 2015-12-13 MED ORDER — CEFAZOLIN SODIUM-DEXTROSE 2-4 GM/100ML-% IV SOLN
2.0000 g | Freq: Four times a day (QID) | INTRAVENOUS | Status: AC
  Administered 2015-12-13 (×2): 2 g via INTRAVENOUS
  Filled 2015-12-13 (×2): qty 100

## 2015-12-13 MED ORDER — PROPOFOL 500 MG/50ML IV EMUL
INTRAVENOUS | Status: DC | PRN
  Administered 2015-12-13: 75 ug/kg/min via INTRAVENOUS

## 2015-12-13 MED ORDER — PHENYLEPHRINE HCL 10 MG/ML IJ SOLN
INTRAMUSCULAR | Status: DC | PRN
  Administered 2015-12-13: 40 ug/min via INTRAVENOUS

## 2015-12-13 MED ORDER — DIPHENHYDRAMINE HCL 12.5 MG/5ML PO ELIX
12.5000 mg | ORAL_SOLUTION | ORAL | Status: DC | PRN
  Administered 2015-12-13: 25 mg via ORAL
  Filled 2015-12-13: qty 10

## 2015-12-13 MED ORDER — BUPIVACAINE HCL (PF) 0.5 % IJ SOLN
INTRAMUSCULAR | Status: AC
  Filled 2015-12-13: qty 30

## 2015-12-13 MED ORDER — MIDAZOLAM HCL 2 MG/2ML IJ SOLN
INTRAMUSCULAR | Status: AC
  Filled 2015-12-13: qty 2

## 2015-12-13 MED ORDER — PHENYLEPHRINE HCL 10 MG/ML IJ SOLN
INTRAMUSCULAR | Status: AC
  Filled 2015-12-13: qty 1

## 2015-12-13 MED ORDER — METOCLOPRAMIDE HCL 5 MG/ML IJ SOLN: 5.0000 mg | Freq: Three times a day (TID) | INTRAMUSCULAR | Status: DC | PRN

## 2015-12-13 MED ORDER — FLEET ENEMA 7-19 GM/118ML RE ENEM: 1.0000 | ENEMA | Freq: Once | RECTAL | Status: DC | PRN

## 2015-12-13 MED ORDER — ACETAMINOPHEN 325 MG PO TABS: 650.0000 mg | ORAL_TABLET | Freq: Four times a day (QID) | ORAL | Status: DC | PRN

## 2015-12-13 MED ORDER — TRANEXAMIC ACID 1000 MG/10ML IV SOLN
1000.0000 mg | INTRAVENOUS | Status: AC
  Administered 2015-12-13: 1000 mg via INTRAVENOUS
  Filled 2015-12-13: qty 10

## 2015-12-13 MED ORDER — CEFAZOLIN SODIUM-DEXTROSE 2-4 GM/100ML-% IV SOLN
INTRAVENOUS | Status: AC
  Filled 2015-12-13: qty 100

## 2015-12-13 MED ORDER — LACTATED RINGERS IV SOLN
INTRAVENOUS | Status: DC
  Administered 2015-12-13: 12:00:00 via INTRAVENOUS
  Administered 2015-12-13: 1000 mL via INTRAVENOUS
  Administered 2015-12-13: 10:00:00 via INTRAVENOUS

## 2015-12-13 MED ORDER — DOCUSATE SODIUM 100 MG PO CAPS
100.0000 mg | ORAL_CAPSULE | Freq: Two times a day (BID) | ORAL | Status: DC
  Administered 2015-12-13 – 2015-12-15 (×4): 100 mg via ORAL
  Filled 2015-12-13 (×4): qty 1

## 2015-12-13 MED ORDER — ONDANSETRON HCL 4 MG/2ML IJ SOLN
INTRAMUSCULAR | Status: AC
  Filled 2015-12-13: qty 2

## 2015-12-13 MED ORDER — BISACODYL 10 MG RE SUPP: 10.0000 mg | Freq: Every day | RECTAL | Status: DC | PRN

## 2015-12-13 MED ORDER — BUPIVACAINE HCL (PF) 0.25 % IJ SOLN
INTRAMUSCULAR | Status: DC | PRN
  Administered 2015-12-13: 30 mL

## 2015-12-13 MED ORDER — TRANEXAMIC ACID 1000 MG/10ML IV SOLN
1000.0000 mg | Freq: Once | INTRAVENOUS | Status: AC
  Administered 2015-12-13: 1000 mg via INTRAVENOUS
  Filled 2015-12-13: qty 10

## 2015-12-13 MED ORDER — METHOCARBAMOL 500 MG PO TABS: 500.0000 mg | ORAL_TABLET | Freq: Four times a day (QID) | ORAL | Status: DC | PRN

## 2015-12-13 MED ORDER — MENTHOL 3 MG MT LOZG: 1.0000 | LOZENGE | OROMUCOSAL | Status: DC | PRN

## 2015-12-13 MED ORDER — MORPHINE SULFATE (PF) 2 MG/ML IV SOLN: 1.0000 mg | INTRAVENOUS | Status: DC | PRN

## 2015-12-13 MED ORDER — METOCLOPRAMIDE HCL 5 MG PO TABS: 5.0000 mg | ORAL_TABLET | Freq: Three times a day (TID) | ORAL | Status: DC | PRN

## 2015-12-13 MED ORDER — DEXAMETHASONE SODIUM PHOSPHATE 10 MG/ML IJ SOLN
INTRAMUSCULAR | Status: AC
  Filled 2015-12-13: qty 1

## 2015-12-13 MED ORDER — HYDROMORPHONE HCL 2 MG/ML IJ SOLN
INTRAMUSCULAR | Status: AC
  Filled 2015-12-13: qty 1

## 2015-12-13 MED ORDER — POVIDONE-IODINE 7.5 % EX SOLN: Freq: Once | CUTANEOUS | Status: DC

## 2015-12-13 MED ORDER — TRAMADOL HCL 50 MG PO TABS: 50.0000 mg | ORAL_TABLET | Freq: Four times a day (QID) | ORAL | Status: DC | PRN

## 2015-12-13 MED ORDER — LACTATED RINGERS IV SOLN: INTRAVENOUS | Status: DC

## 2015-12-13 MED ORDER — BUPIVACAINE HCL (PF) 0.5 % IJ SOLN
INTRAMUSCULAR | Status: DC | PRN
  Administered 2015-12-13: 3 mL

## 2015-12-13 MED ORDER — 0.9 % SODIUM CHLORIDE (POUR BTL) OPTIME
TOPICAL | Status: DC | PRN
  Administered 2015-12-13: 1000 mL

## 2015-12-13 MED ORDER — DEXAMETHASONE SODIUM PHOSPHATE 10 MG/ML IJ SOLN
10.0000 mg | Freq: Once | INTRAMUSCULAR | Status: DC
  Filled 2015-12-13: qty 1

## 2015-12-13 MED ORDER — RIVAROXABAN 10 MG PO TABS
10.0000 mg | ORAL_TABLET | Freq: Every day | ORAL | Status: DC
  Administered 2015-12-14: 10 mg via ORAL
  Filled 2015-12-13 (×2): qty 1

## 2015-12-13 MED ORDER — ACETAMINOPHEN 10 MG/ML IV SOLN
1000.0000 mg | Freq: Once | INTRAVENOUS | Status: AC
  Administered 2015-12-13: 1000 mg via INTRAVENOUS
  Filled 2015-12-13: qty 100

## 2015-12-13 MED ORDER — ONDANSETRON HCL 4 MG/2ML IJ SOLN
INTRAMUSCULAR | Status: DC | PRN
  Administered 2015-12-13: 4 mg via INTRAVENOUS

## 2015-12-13 MED ORDER — SODIUM CHLORIDE 0.9 % IV SOLN
INTRAVENOUS | Status: DC
  Administered 2015-12-13: 75 mL/h via INTRAVENOUS

## 2015-12-13 MED ORDER — BUPIVACAINE HCL (PF) 0.25 % IJ SOLN
INTRAMUSCULAR | Status: AC
Start: 2015-12-13 — End: 2015-12-13
  Filled 2015-12-13: qty 30

## 2015-12-13 MED ORDER — OXYCODONE HCL 5 MG PO TABS
5.0000 mg | ORAL_TABLET | ORAL | Status: DC | PRN
  Administered 2015-12-13: 5 mg via ORAL
  Administered 2015-12-13 – 2015-12-14 (×7): 10 mg via ORAL
  Filled 2015-12-13 (×5): qty 2
  Filled 2015-12-13: qty 1
  Filled 2015-12-13 (×2): qty 2

## 2015-12-13 MED ORDER — TRIAMTERENE-HCTZ 75-50 MG PO TABS
1.0000 | ORAL_TABLET | Freq: Every day | ORAL | Status: DC
  Filled 2015-12-13 (×2): qty 1

## 2015-12-13 MED ORDER — PHENOL 1.4 % MT LIQD: 1.0000 | OROMUCOSAL | Status: DC | PRN

## 2015-12-13 MED ORDER — STERILE WATER FOR IRRIGATION IR SOLN
Status: DC | PRN
  Administered 2015-12-13: 3000 mL

## 2015-12-13 MED ORDER — ACETAMINOPHEN 10 MG/ML IV SOLN
INTRAVENOUS | Status: AC
  Filled 2015-12-13: qty 100

## 2015-12-13 MED ORDER — ONDANSETRON HCL 4 MG/2ML IJ SOLN: 4.0000 mg | Freq: Four times a day (QID) | INTRAMUSCULAR | Status: DC | PRN

## 2015-12-13 MED ORDER — DEXAMETHASONE SODIUM PHOSPHATE 10 MG/ML IJ SOLN
10.0000 mg | Freq: Once | INTRAMUSCULAR | Status: AC
  Administered 2015-12-13: 10 mg via INTRAVENOUS

## 2015-12-13 MED ORDER — ACETAMINOPHEN 650 MG RE SUPP: 650.0000 mg | Freq: Four times a day (QID) | RECTAL | Status: DC | PRN

## 2015-12-13 MED ORDER — POLYETHYLENE GLYCOL 3350 17 G PO PACK: 17.0000 g | PACK | Freq: Every day | ORAL | Status: DC | PRN

## 2015-12-13 MED ORDER — MIDAZOLAM HCL 5 MG/5ML IJ SOLN
INTRAMUSCULAR | Status: DC | PRN
  Administered 2015-12-13: 2 mg via INTRAVENOUS

## 2015-12-13 MED ORDER — FENTANYL CITRATE (PF) 100 MCG/2ML IJ SOLN: 25.0000 ug | INTRAMUSCULAR | Status: DC | PRN

## 2015-12-13 MED ORDER — METHOCARBAMOL 1000 MG/10ML IJ SOLN
500.0000 mg | Freq: Four times a day (QID) | INTRAVENOUS | Status: DC | PRN
  Filled 2015-12-13: qty 5

## 2015-12-13 MED ORDER — POTASSIUM CHLORIDE ER 10 MEQ PO TBCR
10.0000 meq | EXTENDED_RELEASE_TABLET | Freq: Every day | ORAL | Status: DC
  Administered 2015-12-15: 10 meq via ORAL
  Filled 2015-12-13 (×3): qty 1

## 2015-12-13 MED ORDER — OMEPRAZOLE 20 MG PO CPDR
20.0000 mg | DELAYED_RELEASE_CAPSULE | Freq: Every day | ORAL | Status: DC
  Filled 2015-12-13: qty 1

## 2015-12-13 MED ORDER — OMEPRAZOLE MAGNESIUM 20 MG PO TBEC: 20.0000 mg | DELAYED_RELEASE_TABLET | ORAL | Status: DC

## 2015-12-13 SURGICAL SUPPLY — 35 items
BAG DECANTER FOR FLEXI CONT (MISCELLANEOUS) ×3 IMPLANT
BAG SPEC THK2 15X12 ZIP CLS (MISCELLANEOUS)
BAG ZIPLOCK 12X15 (MISCELLANEOUS) IMPLANT
BLADE SAG 18X100X1.27 (BLADE) ×3 IMPLANT
CAPT HIP TOTAL 2 ×2 IMPLANT
CLOSURE WOUND 1/2 X4 (GAUZE/BANDAGES/DRESSINGS) ×1
CLOTH BEACON ORANGE TIMEOUT ST (SAFETY) ×3 IMPLANT
COVER PERINEAL POST (MISCELLANEOUS) ×3 IMPLANT
DECANTER SPIKE VIAL GLASS SM (MISCELLANEOUS) ×1 IMPLANT
DRAPE STERI IOBAN 125X83 (DRAPES) ×3 IMPLANT
DRAPE U-SHAPE 47X51 STRL (DRAPES) ×6 IMPLANT
DRSG ADAPTIC 3X8 NADH LF (GAUZE/BANDAGES/DRESSINGS) ×3 IMPLANT
DRSG MEPILEX BORDER 4X4 (GAUZE/BANDAGES/DRESSINGS) ×3 IMPLANT
DRSG MEPILEX BORDER 4X8 (GAUZE/BANDAGES/DRESSINGS) ×3 IMPLANT
DURAPREP 26ML APPLICATOR (WOUND CARE) ×3 IMPLANT
ELECT REM PT RETURN 9FT ADLT (ELECTROSURGICAL) ×3
ELECTRODE REM PT RTRN 9FT ADLT (ELECTROSURGICAL) ×1 IMPLANT
EVACUATOR 1/8 PVC DRAIN (DRAIN) ×3 IMPLANT
GLOVE BIO SURGEON STRL SZ7.5 (GLOVE) ×3 IMPLANT
GLOVE BIO SURGEON STRL SZ8 (GLOVE) ×6 IMPLANT
GLOVE BIOGEL PI IND STRL 8 (GLOVE) ×2 IMPLANT
GLOVE BIOGEL PI INDICATOR 8 (GLOVE) ×14
GOWN STRL REUS W/TWL LRG LVL3 (GOWN DISPOSABLE) ×7 IMPLANT
GOWN STRL REUS W/TWL XL LVL3 (GOWN DISPOSABLE) ×3 IMPLANT
PACK ANTERIOR HIP CUSTOM (KITS) ×3 IMPLANT
STRIP CLOSURE SKIN 1/2X4 (GAUZE/BANDAGES/DRESSINGS) ×2 IMPLANT
SUT ETHIBOND NAB CT1 #1 30IN (SUTURE) ×3 IMPLANT
SUT MNCRL AB 4-0 PS2 18 (SUTURE) ×3 IMPLANT
SUT VIC AB 2-0 CT1 27 (SUTURE) ×6
SUT VIC AB 2-0 CT1 TAPERPNT 27 (SUTURE) ×2 IMPLANT
SUT VLOC 180 0 24IN GS25 (SUTURE) ×3 IMPLANT
SYR 50ML LL SCALE MARK (SYRINGE) IMPLANT
TRAY FOLEY W/METER SILVER 14FR (SET/KITS/TRAYS/PACK) ×3 IMPLANT
TRAY FOLEY W/METER SILVER 16FR (SET/KITS/TRAYS/PACK) ×1 IMPLANT
YANKAUER SUCT BULB TIP 10FT TU (MISCELLANEOUS) ×3 IMPLANT

## 2015-12-13 NOTE — Anesthesia Procedure Notes (Signed)
Spinal  Patient location during procedure: OR End time: 12/13/2015 10:59 AM Staffing Resident/CRNA: ,  D Performed: anesthesiologist  Preanesthetic Checklist Completed: patient identified, site marked, surgical consent, pre-op evaluation, timeout performed, IV checked, risks and benefits discussed and monitors and equipment checked Spinal Block Patient position: sitting Prep: Betadine Patient monitoring: heart rate, continuous pulse ox and blood pressure Approach: right paramedian Location: L2-3 Injection technique: single-shot Needle Needle type: Spinocan and Sprotte  Needle gauge: 25 G Needle length: 9 cm Assessment Sensory level: T6 Additional Notes Expiration date of kit checked and confirmed. Patient tolerated procedure well, without complications.       

## 2015-12-13 NOTE — Transfer of Care (Signed)
Immediate Anesthesia Transfer of Care Note  Patient: Crystal Ramsey  Procedure(s) Performed: Procedure(s): LEFT TOTAL HIP ARTHROPLASTY ANTERIOR APPROACH (Left)  Patient Location: PACU  Anesthesia Type:Spinal  Level of Consciousness:  sedated, patient cooperative and responds to stimulation  Airway & Oxygen Therapy:Patient Spontanous Breathing and Patient connected to face mask oxgen  Post-op Assessment:  Report given to PACU RN and Post -op Vital signs reviewed and stable  Post vital signs:  Reviewed and stable  Last Vitals:  Vitals:   12/13/15 0746 12/13/15 1243  BP: (!) 162/86 (!) 150/68  Pulse: 90 72  Resp: 18 15  Temp: 37.1 C (P) [id number] C    Complications: No apparent anesthesia complications

## 2015-12-13 NOTE — Anesthesia Postprocedure Evaluation (Signed)
Anesthesia Post Note  Patient: Crystal Ramsey  Procedure(s) Performed: Procedure(s) (LRB): LEFT TOTAL HIP ARTHROPLASTY ANTERIOR APPROACH (Left)  Patient location during evaluation: PACU Anesthesia Type: Spinal Level of consciousness: oriented and awake and alert Pain management: pain level controlled Vital Signs Assessment: post-procedure vital signs reviewed and stable Respiratory status: spontaneous breathing, respiratory function stable and patient connected to nasal cannula oxygen Cardiovascular status: blood pressure returned to baseline and stable Postop Assessment: no headache and no backache Anesthetic complications: no    Last Vitals:  Vitals:   12/13/15 0746 12/13/15 1243  BP: (!) 162/86 (!) 150/68  Pulse: 90 72  Resp: 18 15  Temp: 37.1 C 36.5 C    Last Pain:  Vitals:   12/13/15 1315  TempSrc:   PainSc: 0-No pain                 Dae Antonucci L

## 2015-12-13 NOTE — Anesthesia Preprocedure Evaluation (Signed)
Anesthesia Evaluation  Patient identified by MRN, date of birth, ID band Patient awake    Reviewed: Allergy & Precautions, H&P , NPO status , Patient's Chart, lab work & pertinent test results  History of Anesthesia Complications (+) PONV  Airway Mallampati: II  TM Distance: >3 FB Neck ROM: Full    Dental  (+) Caps, Dental Advisory Given All upper front are capped:   Pulmonary neg pulmonary ROS, former smoker,    Pulmonary exam normal breath sounds clear to auscultation       Cardiovascular hypertension, Pt. on medications Normal cardiovascular exam Rhythm:Regular Rate:Normal     Neuro/Psych negative neurological ROS  negative psych ROS   GI/Hepatic Neg liver ROS, GERD  Medicated,  Endo/Other  negative endocrine ROS  Renal/GU negative Renal ROS  negative genitourinary   Musculoskeletal  (+) Fibromyalgia -  Abdominal   Peds negative pediatric ROS (+)  Hematology negative hematology ROS (+)   Anesthesia Other Findings   Reproductive/Obstetrics negative OB ROS                             Anesthesia Physical  Anesthesia Plan  ASA: II  Anesthesia Plan: Spinal   Post-op Pain Management:    Induction:   Airway Management Planned: Simple Face Mask  Additional Equipment:   Intra-op Plan:   Post-operative Plan:   Informed Consent:   Plan Discussed with: Surgeon  Anesthesia Plan Comments:         Anesthesia Quick Evaluation

## 2015-12-13 NOTE — Anesthesia Procedure Notes (Signed)
Procedures

## 2015-12-13 NOTE — Evaluation (Signed)
Physical Therapy Evaluation Patient Details Name: Crystal Ramsey MRN: [id number] DOB: 1935-08-05 Today's Date: 12/13/2015   History of Present Illness  Pt s/p L THR with hx of R THR and TKR  Clinical Impression  Pt s/p L THR presents with decreased L LE strength/ROM and post op pain limiting functional mobility.  Pt should progress well to dc home with family assist and HHPT follow up.    Follow Up Recommendations Home health PT    Equipment Recommendations  None recommended by PT    Recommendations for Other Services OT consult     Precautions / Restrictions Precautions Precautions: Fall Restrictions Weight Bearing Restrictions: No Other Position/Activity Restrictions: WBAT      Mobility  Bed Mobility Overal bed mobility: Needs Assistance Bed Mobility: Supine to Sit     Supine to sit: Min assist     General bed mobility comments: min cues for sequence and use of R LE to self assist  Transfers Overall transfer level: Needs assistance Equipment used: Rolling walker (2 wheeled) Transfers: Sit to/from Stand Sit to Stand: Min assist         General transfer comment: cues for LE management and use of UEs to self assist  Ambulation/Gait Ambulation/Gait assistance: Min assist Ambulation Distance (Feet): 60 Feet Assistive device: Rolling walker (2 wheeled) Gait Pattern/deviations: Step-to pattern;Decreased step length - right;Decreased step length - left;Shuffle;Trunk flexed Gait velocity: decr Gait velocity interpretation: Below normal speed for age/gender General Gait Details: cues for posture, position from RW and initial sequence  Stairs            Wheelchair Mobility    Modified Rankin (Stroke Patients Only)       Balance                                             Pertinent Vitals/Pain Pain Assessment: 0-10 Pain Score: 3  Pain Location: L hip Pain Descriptors / Indicators: Aching;Sore Pain Intervention(s): Limited  activity within patient's tolerance;Monitored during session;Premedicated before session;Ice applied    Home Living Family/patient expects to be discharged to:: Private residence Living Arrangements: Spouse/significant other Available Help at Discharge: Family Type of Home: House Home Access: Stairs to enter   [employer] of Steps: 3 and 1 or five Home Layout: One level Home Equipment: Fritch - 2 wheels;Cane - single point;Bedside commode      Prior Function Level of Independence: Independent               Hand Dominance        Extremity/Trunk Assessment   Upper Extremity Assessment: Overall WFL for tasks assessed           Lower Extremity Assessment: LLE deficits/detail      Cervical / Trunk Assessment: Normal  Communication   Communication: No difficulties  Cognition Arousal/Alertness: Awake/alert Behavior During Therapy: WFL for tasks assessed/performed Overall Cognitive Status: Within Functional Limits for tasks assessed                      General Comments      Exercises        Assessment/Plan    PT Assessment Patient needs continued PT services  PT Diagnosis Difficulty walking   PT Problem List Decreased strength;Decreased range of motion;Decreased activity tolerance;Decreased mobility;Pain;Decreased knowledge of use of DME  PT Treatment Interventions DME instruction;Gait training;Stair training;Functional mobility  training;Therapeutic activities;Therapeutic exercise;Patient/family education   PT Goals (Current goals can be found in the Care Plan section) Acute Rehab PT Goals Patient Stated Goal: Regain IND and walk without pain PT Goal Formulation: With patient Time For Goal Achievement: 12/16/15 Potential to Achieve Goals: Good    Frequency 7X/week   Barriers to discharge        Co-evaluation               End of Session Equipment Utilized During Treatment: Gait belt Activity Tolerance: Patient tolerated  treatment well Patient left: in chair;with call bell/phone within reach;with chair alarm set Nurse Communication: Mobility status         Time: [id number] PT Time Calculation (min) (ACUTE ONLY): 27 min   Charges:   PT Evaluation $PT Eval Low Complexity: 1 Procedure PT Treatments $Gait Training: 8-22 mins   PT G Codes:        Banyan Goodchild 12-28-15, 5:14 PM

## 2015-12-13 NOTE — H&P (View-Only) (Signed)
Crystal Ramsey DOB: [birth year] Married / Language: English / Race: White Female Date of Admission:  12/13/2015 CC:  Left Hip Pain History of Present Illness  The patient is a 80 year old female who comes in today for a preoperative History and Physical. The patient is scheduled for a left total hip arthroplasty (anterior) to be performed by Dr. Dione Plover. Aluisio, MD at [facility] on 12/13/2015. The patient is a 80 year old female who presented for follow up of their hip. The patient is being followed for their left hip pain and osteoarthritis. Symptoms reported include: pain, aching, stiffness and difficulty ambulating. The patient feels that they are doing poorly. Current treatment includes: relative rest. The following medication has been used for pain control: Hydrocodone. The patient has reported only temporary improvement of their symptoms with: Cortisone injections (but really only helped for about three weeks). She is ready to get the left hip fixed at this time. The intraarticular injection barely helped. They have been treated conservatively in the past for the above stated problem and despite conservative measures, they continue to have progressive pain and severe functional limitations and dysfunction. They have failed non-operative management including home exercise, medications, and injections. It is felt that they would benefit from undergoing total joint replacement. Risks and benefits of the procedure have been discussed with the patient and they elect to proceed with surgery. There are no active contraindications to surgery such as ongoing infection or rapidly progressive neurological disease.  Problem List/Past Medical Problems Reconciled  Lumbosacral spondylosis (M47.817)  Pain in joint involving right ankle and foot (M25.571)  Aftercare following right hip joint replacement surgery (Z47.1)  Lumbar disc degeneration (M51.36)  Bursitis of left hip (M70.72)  Primary  osteoarthritis of right hip (M16.11)  Status post right hip replacement [id number])  Status post total right knee replacement (Z96.651)  Pes planus of right foot (M21.41)  Bronchitis  Urinary Tract Infection  Degenerative Disc Disease  Scarlet Fever  Streptococcus Infections  Fibroadenoma Breast Disease  Chronic Pain  Autoimmune disorder  Varicose veins  Osteoarthritis  Gastroesophageal Reflux Disease  High blood pressure  Fibromyalgia  Cataract  Impaired Hearing  Depression  Measles  Mumps  Menopause  Osteoarthrosis NOS, lower leg (715.96)  Allergies Chlorhexidine *PHARMACEUTICAL ADJUVANTS*  Chlorhexidine Gluconate *Antiseptics & Disinfectants**  chlorascrub Dyes  eye exam dyes Zithromax *MACROLIDES*  Demerol *ANALGESICS - OPIOID*  Nausea, Vomiting. Penicillins  young adulthood reaction - does not remember Sulfa Drugs  unknown reaction - does not remember Dilaudid *ANALGESICS - OPIOID*  Nausea, Vomiting.  Family History Hypertension  father and sister First Degree Relatives  Osteoporosis  sister Osteoarthritis  father, sister, brother and grandmother mothers side Cancer  First Degree Relatives. mother, sister and brother  Social History  Living situation  live with spouse Tobacco / smoke exposure  yes outdoors only Alcohol use  current drinker; drinks wine; 2-5 oz. Pain Contract  no Number of flights of stairs before winded  less than 1 Children  1 Illicit drug use  no Drug/Alcohol Rehab (Currently)  no Current work status  retired Tobacco use  Former smoker. Exercise  Exercises rarely Previously in rehab  no Marital status  married Advance Directives  Living Will, Healthcare POA [place] is to go home following the Left TH-AA to be performed on 12/13/2015.  Medication History (Alezandrew L. Tekeya Geffert, III PA-C; 12/07/2015 2:22 PM) Amiloride-Hydrochlorothiazide (5-50MG  Tablet, Oral) Active. (1/2  tablet) Hydrocodone-Acetaminophen (7.5-325MG  Tablet, Oral) Active. PriLOSEC (  20MG  Capsule DR, Oral) Active. (every other day) Tums (Oral) Specific strength unknown - Active. (3 500mg  tablets per day) Clobetasol Propionate (0.05% Cream, External as needed) Active.  Past Surgical History Total Knee Replacement - Right  Date: 05/25/2012. Dilation and Curettage of Uterus  Cataract Surgery  left Tonsillectomy  Hysterectomy  partial (non-cancerous) Breast Biopsy  right Total Hip Replacement - Right   Review of Systems General Not Present- Chills, Fatigue, Fever, Memory Loss, Night Sweats, Weight Gain and Weight Loss. Skin Present- Itching. Not Present- Eczema, Hives, Lesions and Rash. HEENT Present- Hearing Loss. Not Present- Dentures, Double Vision, Headache, Tinnitus and Visual Loss. Respiratory Not Present- Allergies, Chronic Cough, Coughing up blood, Shortness of breath at rest and Shortness of breath with exertion. Cardiovascular Not Present- Chest Pain, Difficulty Breathing Lying Down, Murmur, Palpitations, Racing/skipping heartbeats and Swelling. Gastrointestinal Present- Constipation and Heartburn. Not Present- Abdominal Pain, Bloody Stool, Diarrhea, Difficulty Swallowing, Jaundice, Loss of appetitie, Nausea and Vomiting. Female Genitourinary Present- Urinating at Night. Not Present- Blood in Urine, Discharge, Flank Pain, Incontinence, Painful Urination, Urgency, Urinary frequency, Urinary Retention and Weak urinary stream. Musculoskeletal Present- Back Pain, Joint Pain and Morning Stiffness. Not Present- Joint Swelling, Muscle Pain, Muscle Weakness and Spasms. Neurological Not Present- Blackout spells, Difficulty with balance, Dizziness, Paralysis, Tremor and Weakness. Psychiatric Not Present- Insomnia.  Vitals  Weight: 161 lb Height: 61.25in Weight was reported by patient. Height was reported by patient. Body Surface Area: 1.73 m Body Mass Index: 30.17 kg/m   Pulse: 64 (Regular)  BP: 148/86 (Sitting, Left Arm, Standard)   Physical Exam General Mental Status -Alert, cooperative and good historian. General Appearance-pleasant, Not in acute distress. Orientation-Oriented X3. Build & Nutrition-Well nourished and Well developed.  Head and Neck Head-normocephalic, atraumatic . Neck Global Assessment - supple, no bruit auscultated on the right, no bruit auscultated on the left.  Eye Vision-Wears corrective lenses. Pupil - Bilateral-Regular and Round. Motion - Bilateral-EOMI.  Chest and Lung Exam Auscultation Breath sounds - clear at anterior chest wall and clear at posterior chest wall. Adventitious sounds - No Adventitious sounds.  Cardiovascular Auscultation Rhythm - Regular rate and rhythm. Heart Sounds - S1 WNL and S2 WNL. Murmurs & Other Heart Sounds - Auscultation of the heart reveals - No Murmurs.  Abdomen Palpation/Percussion Tenderness - Abdomen is non-tender to palpation. Rigidity (guarding) - Abdomen is soft. Auscultation Auscultation of the abdomen reveals - Bowel sounds normal.  Female Genitourinary Note: Not done, not pertinent to present illness   Musculoskeletal Note: On exam, she is in no distress. Left hip can be flexed to about 90, no internal rotation, about 10 external rotation, 10 abduction.  RADIOGRAPHS We reviewed her x-rays AP pelvis and lateral of the hip and she has severe bone on bone arthritis of the left hip.  Assessment & Plan Primary osteoarthritis of left hip (M16.12) Status post right hip replacement [id number]) Status post total right knee replacement [id number])  Note:Surgical Plans: Left Total Hip Replacement - Anterior Approach  Disposition: Home  PCP: Dr. Laurann Montana - Patient has been seen preoperatively and felt to be stable for surgery.  IV TXA  Anesthesia Issues: Nausea and Vomiting Postop  Signed electronically by Ok Edwards, III PA-C

## 2015-12-13 NOTE — Op Note (Signed)
OPERATIVE REPORT  PREOPERATIVE DIAGNOSIS: Osteoarthritis of the Left hip.   POSTOPERATIVE DIAGNOSIS: Osteoarthritis of the Left  hip.   PROCEDURE: Left total hip arthroplasty, anterior approach.   SURGEON: Gaynelle Arabian, MD   ASSISTANT: [name], PA-C  ANESTHESIA:  Spinal  ESTIMATED BLOOD LOSS:-150 ml   DRAINS: Hemovac x1.   COMPLICATIONS: None   CONDITION: PACU - hemodynamically stable.   BRIEF CLINICAL NOTE: Crystal Ramsey is a 80 y.o. female who has advanced end-  stage arthritis of their Left  hip with progressively worsening pain and  dysfunction.The patient has failed nonoperative management and presents for  total hip arthroplasty.   PROCEDURE IN DETAIL: After successful administration of spinal  anesthetic, the traction boots for the [facility] bed were placed on both  feet and the patient was placed onto the [facility] bed, boots placed into the leg  holders. The Left hip was then isolated from the perineum with plastic  drapes and prepped and draped in the usual sterile fashion. ASIS and  greater trochanter were marked and a oblique incision was made, starting  at about 1 cm lateral and 2 cm distal to the ASIS and coursing towards  the anterior cortex of the femur. The skin was cut with a 10 blade  through subcutaneous tissue to the level of the fascia overlying the  tensor fascia lata muscle. The fascia was then incised in line with the  incision at the junction of the anterior third and posterior 2/3rd. The  muscle was teased off the fascia and then the interval between the TFL  and the rectus was developed. The Hohmann retractor was then placed at  the top of the femoral neck over the capsule. The vessels overlying the  capsule were cauterized and the fat on top of the capsule was removed.  A Hohmann retractor was then placed anterior underneath the rectus  femoris to give exposure to the entire anterior capsule. A T-shaped  capsulotomy was performed.  The edges were tagged and the femoral head  was identified.       Osteophytes are removed off the superior acetabulum.  The femoral neck was then cut in situ with an oscillating saw. Traction  was then applied to the left lower extremity utilizing the [facility]  traction. The femoral head was then removed. Retractors were placed  around the acetabulum and then circumferential removal of the labrum was  performed. Osteophytes were also removed. Reaming starts at 45 mm to  medialize and  Increased in 2 mm increments to 49 mm. We reamed in  approximately 40 degrees of abduction, 20 degrees anteversion. A 50 mm  pinnacle acetabular shell was then impacted in anatomic position under  fluoroscopic guidance with excellent purchase. We did not need to place  any additional dome screws. A 32 mm neutral + 4 marathon liner was then  placed into the acetabular shell.       The femoral lift was then placed along the lateral aspect of the femur  just distal to the vastus ridge. The leg was  externally rotated and capsule  was stripped off the inferior aspect of the femoral neck down to the  level of the lesser trochanter, this was done with electrocautery. The femur was lifted after this was performed. The  leg was then placed in an extended and adducted position essentially delivering the femur. We also removed the capsule superiorly and the piriformis from the piriformis fossa  to gain excellent exposure of the  proximal femur. Rongeur was used to remove some cancellous bone to get  into the lateral portion of the proximal femur for placement of the  initial starter reamer. The starter broaches was placed  the starter broach  and was shown to go down the center of the canal. Broaching  with the  Corail system was then performed starting at size 8, coursing  Up to size 12. A size 12 had excellent torsional and rotational  and axial stability. The trial high offset neck was then placed  with a 32 + 1 trial  head. The hip was then reduced. We confirmed that  the stem was in the canal both on AP and lateral x-rays. It also has excellent sizing. The hip was reduced with outstanding stability through full extension and full external rotation.. AP pelvis was taken and the leg lengths were measured and found to be equal. Hip was then dislocated again and the femoral head and neck removed. The  femoral broach was removed. Size 12 Corail stem with a high offset  neck was then impacted into the femur following native anteversion. Has  excellent purchase in the canal. Excellent torsional and rotational and  axial stability. It is confirmed to be in the canal on AP and lateral  fluoroscopic views. The 32 + 1 ceramic head was placed and the hip  reduced with outstanding stability. Again AP pelvis was taken and it  confirmed that the leg lengths were equal. The wound was then copiously  irrigated with saline solution and the capsule reattached and repaired  with Ethibond suture. 30 ml of .25% Bupivicaine was  injected into the capsule and into the edge of the tensor fascia lata as well as subcutaneous tissue. The fascia overlying the tensor fascia lata was then closed with a running #1 V-Loc. Subcu was closed with interrupted 2-0 Vicryl and subcuticular running 4-0 Monocryl. Incision was cleaned  and dried. Steri-Strips and a bulky sterile dressing applied. Hemovac  drain was hooked to suction and then the patient was awakened and transported to  recovery in stable condition.        Please note that a surgical assistant was a medical necessity for this procedure to perform it in a safe and expeditious manner. Assistant was necessary to provide appropriate retraction of vital neurovascular structures and to prevent femoral fracture and allow for anatomic placement of the prosthesis.  Gaynelle Arabian, M.D.

## 2015-12-13 NOTE — Interval H&P Note (Signed)
History and Physical Interval Note:  12/13/2015 10:00 AM  Crystal Ramsey  has presented today for surgery, with the diagnosis of LEFT HIP OA  The various methods of treatment have been discussed with the patient and family. After consideration of risks, benefits and other options for treatment, the patient has consented to  Procedure(s): LEFT TOTAL HIP ARTHROPLASTY ANTERIOR APPROACH (Left) as a surgical intervention .  The patient's history has been reviewed, patient examined, no change in status, stable for surgery.  I have reviewed the patient's chart and labs.  Questions were answered to the patient's satisfaction.     Gearlean Alf

## 2015-12-14 LAB — BASIC METABOLIC PANEL
ANION GAP: 8 (ref 5–15)
BUN: 13 mg/dL (ref 6–20)
CHLORIDE: 97 mmol/L — AB (ref 101–111)
CO2: 28 mmol/L (ref 22–32)
Calcium: 8.3 mg/dL — ABNORMAL LOW (ref 8.9–10.3)
Creatinine, Ser: 0.62 mg/dL (ref 0.44–1.00)
GFR calc non Af Amer: >60 mL/min (ref >60)
Glucose, Bld: 131 mg/dL — ABNORMAL HIGH (ref 65–99)
POTASSIUM: 2.9 mmol/L — AB (ref 3.5–5.1)
SODIUM: 133 mmol/L — AB (ref 135–145)

## 2015-12-14 LAB — CBC
HEMATOCRIT: 31.6 % — AB (ref 36.0–46.0)
HEMOGLOBIN: 11 g/dL — AB (ref 12.0–15.0)
MCH: 31.6 pg (ref 26.0–34.0)
MCHC: 34.8 g/dL (ref 30.0–36.0)
MCV: 90.8 fL (ref 78.0–100.0)
Platelets: 235 10*3/uL (ref 150–400)
RBC: 3.48 MIL/uL — AB (ref 3.87–5.11)
RDW: 13.2 % (ref 11.5–15.5)
WBC: 7.9 10*3/uL (ref 4.0–10.5)

## 2015-12-14 MED ORDER — POTASSIUM CHLORIDE CRYS ER 20 MEQ PO TBCR
40.0000 meq | EXTENDED_RELEASE_TABLET | ORAL | Status: AC
  Administered 2015-12-14 (×2): 40 meq via ORAL
  Filled 2015-12-14 (×3): qty 2

## 2015-12-14 MED ORDER — HYDROCODONE-ACETAMINOPHEN 5-325 MG PO TABS
1.0000 | ORAL_TABLET | ORAL | Status: DC | PRN
  Administered 2015-12-14 – 2015-12-15 (×4): 2 via ORAL
  Filled 2015-12-14 (×4): qty 2

## 2015-12-14 NOTE — Care Management Note (Signed)
Case Management Note  Patient Details  Name: LYLIE BLACKLOCK MRN: [id number] Date of Birth: [birth year]  Subjective/Objective:                  LEFT TOTAL HIP ARTHROPLASTY ANTERIOR APPROACH (Left) Action/Plan: Discharge planning Expected Discharge Date:                 Expected Discharge Plan:  Home/Self Care  In-House Referral:     Discharge planning Services  CM Consult  Post Acute Care Choice:  NA Choice offered to:  Patient  DME Arranged:  N/A DME Agency:  NA  HH Arranged:  NA HH Agency:     Status of Service:  Completed, signed off  If discussed at [place] of Stay Meetings, dates discussed:    Additional Comments: CM met with pt to confirm (per Christean Grief note), no home health services; pt confirms.  Pt states she has 3n1 and rolling walker from previous surgery.  No other CM needs were communicated. Dellie Catholic, RN 12/14/2015, 1:11 PM

## 2015-12-14 NOTE — Progress Notes (Signed)
   Subjective: 1 Day Post-Op Procedure(s) (LRB): LEFT TOTAL HIP ARTHROPLASTY ANTERIOR APPROACH (Left) Patient reports pain as mild.   Patient seen in rounds by Dr. Wynelle Link. Patient is well, but has had some minor complaints of pain in the hip and thigh, requiring pain medications We will start therapy today.  Plan is to go Home after hospital stay.  Objective: Vital signs in last 24 hours: Temp:  [97.4 F (36.3 C)-98.7 F (37.1 C)] 98.6 F (37 C) (08/03 0635) Pulse Rate:  [66-87] 74 (08/03 0635) Resp:  [10-18] 17 (08/03 0635) BP: (126-153)/(57-82) 141/58 (08/03 0635) SpO2:  [96 %-100 %] 96 % (08/03 0635) Weight:  [73.9 kg (163 lb)-74 kg (163 lb 3 oz)] 73.9 kg (163 lb) (08/02 1359)  Intake/Output from previous day:  Intake/Output Summary (Last 24 hours) at 12/14/15 0746 Last data filed at 12/14/15 0636  Gross per 24 hour  Intake             2890 ml  Output             2960 ml  Net              -70 ml    Intake/Output this shift: No intake/output data recorded.  Labs:  Recent Labs  12/14/15 0415  HGB 11.0*    Recent Labs  12/14/15 0415  WBC 7.9  RBC 3.48*  HCT 31.6*  PLT 235    Recent Labs  12/14/15 0415  NA 133*  K 2.9*  CL 97*  CO2 28  BUN 13  CREATININE 0.62  GLUCOSE 131*  CALCIUM 8.3*   No results for input(s): LABPT, INR in the last 72 hours.  EXAM General - Patient is Alert, Appropriate and Oriented Extremity - Neurovascular intact Sensation intact distally Dorsiflexion/Plantar flexion intact Dressing - dressing C/D/I Motor Function - intact, moving foot and toes well on exam.  Hemovac pulled without difficulty.  Past Medical History:  Diagnosis Date  . Arthritis   . Back pain   . Complication of anesthesia   . Constipation, chronic   . DES exposure in utero   . Family history of adverse reaction to anesthesia    VOMITING  . Fibromyalgia    PT THINKS SHE HAS FIBROMYALGIA  . GERD (gastroesophageal reflux disease)   . 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 Hypertension   . Lichen plano-pilaris   . PONV (postoperative nausea and vomiting)     Assessment/Plan: 1 Day Post-Op Procedure(s) (LRB): LEFT TOTAL HIP ARTHROPLASTY ANTERIOR APPROACH (Left) Active Problems:   OA (osteoarthritis) of hip  Estimated body mass index is 30.05 kg/m as calculated from the following:   Height as of this encounter: 5' 1.75" (1.568 m).   Weight as of this encounter: 73.9 kg (163 lb). Advance diet Up with therapy Plan for discharge tomorrow Discharge home with home health  DVT Prophylaxis - Xarelto Weight Bearing As Tolerated left Leg Hemovac Pulled Begin Therapy  [name], PA-C Orthopaedic Surgery 12/14/2015, 7:46 AM

## 2015-12-14 NOTE — Progress Notes (Signed)
OT Cancellation Note  Patient Details Name: ELY BONIFAS MRN: [id number] DOB: 10/06/1935   Cancelled Treatment:    Reason Eval/Treat Not Completed: OT screened, no needs identified, will sign off  Ivana Nicastro 12/14/2015, 11:40 AM  Lesle Chris, OTR/L [phone] 12/14/2015

## 2015-12-14 NOTE — Discharge Instructions (Addendum)
° °Dr. Frank Aluisio °Total Joint Specialist °[facility] °[address] °[place], [place] [address] °[phone] ° °ANTERIOR APPROACH TOTAL HIP REPLACEMENT POSTOPERATIVE DIRECTIONS ° ° °Hip Rehabilitation, Guidelines Following Surgery  °The results of a hip operation are greatly improved after range of motion and muscle strengthening exercises. Follow all safety measures which are given to protect your hip. If any of these exercises cause increased pain or swelling in your joint, decrease the amount until you are comfortable again. Then slowly increase the exercises. Call your caregiver if you have problems or questions.  ° °HOME CARE INSTRUCTIONS  °Remove items at home which could result in a fall. This includes throw rugs or furniture in walking pathways.  °· ICE to the affected hip every three hours for 30 minutes at a time and then as needed for pain and swelling.  Continue to use ice on the hip for pain and swelling from surgery. You may notice swelling that will progress down to the foot and ankle.  This is normal after surgery.  Elevate the leg when you are not up walking on it.   °· Continue to use the breathing machine which will help keep your temperature down.  It is common for your temperature to cycle up and down following surgery, especially at night when you are not up moving around and exerting yourself.  The breathing machine keeps your lungs expanded and your temperature down. ° ° °DIET °You may resume your previous home diet once your are discharged from the hospital. ° °DRESSING / WOUND CARE / SHOWERING °You may shower 3 days after surgery, but keep the wounds dry during showering.  You may use an occlusive plastic wrap (Press'n Seal for example), NO SOAKING/SUBMERGING IN THE BATHTUB.  If the bandage gets wet, change with a clean dry gauze.  If the incision gets wet, pat the wound dry with a clean towel. °You may start showering once you are discharged home but do not  submerge the incision under water. Just pat the incision dry and apply a dry gauze dressing on daily. °Change the surgical dressing daily and reapply a dry dressing each time. ° °ACTIVITY °Walk with your walker as instructed. °Use walker as long as suggested by your caregivers. °Avoid periods of inactivity such as sitting longer than an hour when not asleep. This helps prevent blood clots.  °You may resume a sexual relationship in one month or when given the OK by your doctor.  °You may return to work once you are cleared by your doctor.  °Do not drive a car for 6 weeks or until released by you surgeon.  °Do not drive while taking narcotics. ° °WEIGHT BEARING °Weight bearing as tolerated with assist device (walker, cane, etc) as directed, use it as long as suggested by your surgeon or therapist, typically at least 4-6 weeks. ° °POSTOPERATIVE CONSTIPATION PROTOCOL °Constipation - defined medically as fewer than three stools per week and severe constipation as less than one stool per week. ° °One of the most common issues patients have following surgery is constipation.  Even if you have a regular bowel pattern at home, your normal regimen is likely to be disrupted due to multiple reasons following surgery.  Combination of anesthesia, postoperative narcotics, change in appetite and fluid intake all can affect your bowels.  In order to avoid complications following surgery, here are some recommendations in order to help you during your recovery period. ° °Colace (docusate) - Pick up an over-the-counter   form of Colace or another stool softener and take twice a day as long as you are requiring postoperative pain medications.  Take with a full glass of water daily.  If you experience loose stools or diarrhea, hold the colace until you stool forms back up.  If your symptoms do not get better within 1 week or if they get worse, check with your doctor. ° °Dulcolax (bisacodyl) - Pick up over-the-counter and take as directed  by the product packaging as needed to assist with the movement of your bowels.  Take with a full glass of water.  Use this product as needed if not relieved by Colace only.  ° °MiraLax (polyethylene glycol) - Pick up over-the-counter to have on hand.  MiraLax is a solution that will increase the amount of water in your bowels to assist with bowel movements.  Take as directed and can mix with a glass of water, juice, soda, coffee, or tea.  Take if you go more than two days without a movement. °Do not use MiraLax more than once per day. Call your doctor if you are still constipated or irregular after using this medication for 7 days in a row. ° °If you continue to have problems with postoperative constipation, please contact the office for further assistance and recommendations.  If you experience "the worst abdominal pain ever" or develop nausea or vomiting, please contact the office immediatly for further recommendations for treatment. ° °ITCHING ° If you experience itching with your medications, try taking only a single pain pill, or even half a pain pill at a time.  You can also use Benadryl over the counter for itching or also to help with sleep.  ° °TED HOSE STOCKINGS °Wear the elastic stockings on both legs for three weeks following surgery during the day but you may remove then at night for sleeping. ° °MEDICATIONS °See your medication summary on the “After Visit Summary” that the nursing staff will review with you prior to discharge.  You may have some home medications which will be placed on hold until you complete the course of blood thinner medication.  It is important for you to complete the blood thinner medication as prescribed by your surgeon.  Continue your approved medications as instructed at time of discharge. ° °PRECAUTIONS °If you experience chest pain or shortness of breath - call 911 immediately for transfer to the hospital emergency department.  °If you develop a fever greater that 101 F,  purulent drainage from wound, increased redness or drainage from wound, foul odor from the wound/dressing, or calf pain - CONTACT YOUR SURGEON.   °                                                °FOLLOW-UP APPOINTMENTS °Make sure you keep all of your appointments after your operation with your surgeon and caregivers. You should call the office at the above phone number and make an appointment for approximately two weeks after the date of your surgery or on the date instructed by your surgeon outlined in the "After Visit Summary". ° °RANGE OF MOTION AND STRENGTHENING EXERCISES  °These exercises are designed to help you keep full movement of your hip joint. Follow your caregiver's or physical therapist's instructions. Perform all exercises about fifteen times, three times per day or as directed. Exercise both hips, even if you   have had only one joint replacement. These exercises can be done on a training (exercise) mat, on the floor, on a table or on a bed. Use whatever works the best and is most comfortable for you. Use music or television while you are exercising so that the exercises are a pleasant break in your day. This will make your life better with the exercises acting as a break in routine you can look forward to.  Lying on your back, slowly slide your foot toward your buttocks, raising your knee up off the floor. Then slowly slide your foot back down until your leg is straight again.  Lying on your back spread your legs as far apart as you can without causing discomfort.  Lying on your side, raise your upper leg and foot straight up from the floor as far as is comfortable. Slowly lower the leg and repeat.  Lying on your back, tighten up the muscle in the front of your thigh (quadriceps muscles). You can do this by keeping your leg straight and trying to raise your heel off the floor. This helps strengthen the largest muscle supporting your knee.  Lying on your back, tighten up the muscles of your  buttocks both with the legs straight and with the knee bent at a comfortable angle while keeping your heel on the floor.   IF YOU ARE TRANSFERRED TO A SKILLED REHAB FACILITY If the patient is transferred to a skilled rehab facility following release from the hospital, a list of the current medications will be sent to the facility for the patient to continue.  When discharged from the skilled rehab facility, please have the facility set up the patient's [place] prior to being released. Also, the skilled facility will be responsible for providing the patient with their medications at time of release from the facility to include their pain medication, the muscle relaxants, and their blood thinner medication. If the patient is still at the rehab facility at time of the two week follow up appointment, the skilled rehab facility will also need to assist the patient in arranging follow up appointment in our office and any transportation needs.  MAKE SURE YOU:  Understand these instructions.  Get help right away if you are not doing well or get worse.    Pick up stool softner and laxative for home use following surgery while on pain medications. Do not submerge incision under water. Please use good hand washing techniques while changing dressing each day. May shower starting three days after surgery. Please use a clean towel to pat the incision dry following showers. Continue to use ice for pain and swelling after surgery. Do not use any lotions or creams on the incision until instructed by your surgeon.  Take a 325 mg Aspirin twice a day for three weeks and then reduce to a baby 81 mg Aspirin daily for three additional weeks.

## 2015-12-14 NOTE — Progress Notes (Signed)
Physical Therapy Treatment Patient Details Name: Crystal Ramsey MRN: [id number] DOB: 04-27-1936 Today's Date: 12/14/2015    History of Present Illness Pt s/p L THR with hx of R THR and TKR    PT Comments    Pt motivated and progressing well with mobility.  Follow Up Recommendations  Home health PT     Equipment Recommendations  None recommended by PT    Recommendations for Other Services OT consult     Precautions / Restrictions Precautions Precautions: Fall Restrictions Weight Bearing Restrictions: No Other Position/Activity Restrictions: WBAT    Mobility  Bed Mobility Overal bed mobility: Needs Assistance Bed Mobility: Supine to Sit     Supine to sit: Min assist     General bed mobility comments: min cues for sequence and use of R LE to self assist  Transfers Overall transfer level: Needs assistance Equipment used: Rolling walker (2 wheeled) Transfers: Sit to/from Stand Sit to Stand: Min assist         General transfer comment: cues for LE management and use of UEs to self assist  Ambulation/Gait Ambulation/Gait assistance: Min assist;Min guard Ambulation Distance (Feet): 140 Feet Assistive device: Rolling walker (2 wheeled) Gait Pattern/deviations: Step-to pattern;Step-through pattern;Decreased step length - right;Decreased step length - left;Shuffle;Trunk flexed Gait velocity: decr Gait velocity interpretation: Below normal speed for age/gender General Gait Details: cues for posture, position from RW and initial sequence   Stairs            Wheelchair Mobility    Modified Rankin (Stroke Patients Only)       Balance                                    Cognition Arousal/Alertness: Awake/alert Behavior During Therapy: WFL for tasks assessed/performed Overall Cognitive Status: Within Functional Limits for tasks assessed                      Exercises Total Joint Exercises Ankle Circles/Pumps: AROM;Both;15  reps;Supine Quad Sets: AROM;Both;10 reps;Supine Heel Slides: AAROM;Right;20 reps;Supine Hip ABduction/ADduction: AAROM;Right;15 reps;Supine    General Comments        Pertinent Vitals/Pain Pain Assessment: 0-10 Pain Score: 5  Pain Location: L hip Pain Descriptors / Indicators: Aching;Sore Pain Intervention(s): Limited activity within patient's tolerance;Monitored during session;Premedicated before session;Ice applied    Home Living                      Prior Function            PT Goals (current goals can now be found in the care plan section) Acute Rehab PT Goals Patient Stated Goal: Regain IND and walk without pain PT Goal Formulation: With patient Time For Goal Achievement: 12/16/15 Potential to Achieve Goals: Good Progress towards PT goals: Progressing toward goals    Frequency  7X/week    PT Plan Current plan remains appropriate    Co-evaluation             End of Session Equipment Utilized During Treatment: Gait belt Activity Tolerance: Patient tolerated treatment well Patient left: in chair;with call bell/phone within reach;with chair alarm set     Time: [id number] PT Time Calculation (min) (ACUTE ONLY): 30 min  Charges:  $Gait Training: 8-22 mins $Therapeutic Exercise: 8-22 mins                    G Codes:  Crystal Ramsey 12/14/2015, 12:33 PM

## 2015-12-15 LAB — BASIC METABOLIC PANEL
Anion gap: 8 (ref 5–15)
BUN: 10 mg/dL (ref 6–20)
CALCIUM: 8.2 mg/dL — AB (ref 8.9–10.3)
CO2: 24 mmol/L (ref 22–32)
CREATININE: 0.84 mg/dL (ref 0.44–1.00)
Chloride: 103 mmol/L (ref 101–111)
GFR calc non Af Amer: >60 mL/min (ref >60)
Glucose, Bld: 122 mg/dL — ABNORMAL HIGH (ref 65–99)
Potassium: 3.1 mmol/L — ABNORMAL LOW (ref 3.5–5.1)
SODIUM: 135 mmol/L (ref 135–145)

## 2015-12-15 LAB — CBC
HCT: 30.7 % — ABNORMAL LOW (ref 36.0–46.0)
Hemoglobin: 11 g/dL — ABNORMAL LOW (ref 12.0–15.0)
MCH: 31.8 pg (ref 26.0–34.0)
MCHC: 35.8 g/dL (ref 30.0–36.0)
MCV: 88.7 fL (ref 78.0–100.0)
Platelets: 457 10*3/uL — ABNORMAL HIGH (ref 150–400)
RBC: 3.46 MIL/uL — ABNORMAL LOW (ref 3.87–5.11)
RDW: 13.8 % (ref 11.5–15.5)
WBC: 22.2 10*3/uL — ABNORMAL HIGH (ref 4.0–10.5)

## 2015-12-15 MED ORDER — ASPIRIN EC 325 MG PO TBEC: 325.0000 mg | DELAYED_RELEASE_TABLET | Freq: Two times a day (BID) | ORAL | 0 refills | Status: DC

## 2015-12-15 MED ORDER — METHOCARBAMOL 500 MG PO TABS: 500.0000 mg | ORAL_TABLET | Freq: Four times a day (QID) | ORAL | 0 refills | Status: DC | PRN

## 2015-12-15 MED ORDER — TRAMADOL HCL 50 MG PO TABS: 50.0000 mg | ORAL_TABLET | Freq: Four times a day (QID) | ORAL | 1 refills | Status: DC | PRN

## 2015-12-15 MED ORDER — HYDROCODONE-ACETAMINOPHEN 7.5-325 MG PO TABS: 1.0000 | ORAL_TABLET | ORAL | 0 refills | Status: DC | PRN

## 2015-12-15 MED ORDER — POTASSIUM CHLORIDE CRYS ER 20 MEQ PO TBCR
40.0000 meq | EXTENDED_RELEASE_TABLET | ORAL | Status: DC
  Administered 2015-12-15: 10 meq via ORAL
  Filled 2015-12-15: qty 2

## 2015-12-15 NOTE — Discharge Summary (Signed)
Physician Discharge Summary   Patient ID: Crystal Ramsey MRN: [id number] DOB/AGE: 09-26-1935 80 y.o.  Admit date: 12/13/2015 Discharge date: 12/15/2015  Primary Diagnosis:  Osteoarthritis of the Left hip.   Admission Diagnoses:  Past Medical History:  Diagnosis Date  . Arthritis   . Back pain   . Complication of anesthesia   . Constipation, chronic   . DES exposure in utero   . Family history of adverse reaction to anesthesia    VOMITING  . Fibromyalgia    PT THINKS SHE HAS FIBROMYALGIA  . GERD (gastroesophageal reflux disease)   . 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 Hypertension   . Lichen plano-pilaris   . PONV (postoperative nausea and vomiting)    Discharge Diagnoses:   Active Problems:   OA (osteoarthritis) of hip  Estimated body mass index is 30.05 kg/m as calculated from the following:   Height as of this encounter: 5' 1.75" (1.568 m).   Weight as of this encounter: 73.9 kg (163 lb).  Procedure:  Procedure(s) (LRB): LEFT TOTAL HIP ARTHROPLASTY ANTERIOR APPROACH (Left)   Consults: None  HPI: Crystal Ramsey is a 80 y.o. female who has advanced end-  stage arthritis of their Left  hip with progressively worsening pain and  dysfunction.The patient has failed nonoperative management and presents for  total hip arthroplasty.   Laboratory Data: Admission on 12/13/2015  Component Date Value Ref Range Status  . WBC 12/14/2015 7.9  4.0 - 10.5 K/uL Final  . RBC 12/14/2015 3.48* 3.87 - 5.11 MIL/uL Final  . Hemoglobin 12/14/2015 11.0* 12.0 - 15.0 g/dL Final  . HCT 12/14/2015 31.6* 36.0 - 46.0 % Final  . MCV 12/14/2015 90.8  78.0 - 100.0 fL Final  . MCH 12/14/2015 31.6  26.0 - 34.0 pg Final  . MCHC 12/14/2015 34.8  30.0 - 36.0 g/dL Final  . RDW 12/14/2015 13.2  11.5 - 15.5 % Final  . Platelets 12/14/2015 235  150 - 400 K/uL Final  . Sodium 12/14/2015 133* 135 - 145 mmol/L Final  . Potassium 12/14/2015 2.9* 3.5 -  5.1 mmol/L Final  . Chloride 12/14/2015 97* 101 - 111 mmol/L Final  . CO2 12/14/2015 28  22 - 32 mmol/L Final  . Glucose, Bld 12/14/2015 131* 65 - 99 mg/dL Final  . BUN 12/14/2015 13  6 - 20 mg/dL Final  . Creatinine, Ser 12/14/2015 0.62  0.44 - 1.00 mg/dL Final  . Calcium 12/14/2015 8.3* 8.9 - 10.3 mg/dL Final  . GFR calc non Af Amer 12/14/2015 >60  >60 mL/min Final  . GFR calc Af Amer 12/14/2015 >60  >60 mL/min Final   Comment: (NOTE) The eGFR has been calculated using the CKD EPI equation. This calculation has not been validated in all clinical situations. eGFR's persistently <60 mL/min signify possible Chronic Kidney Disease.   . Anion gap 12/14/2015 8  5 - 15 Final  . WBC 12/15/2015 22.2* 4.0 - 10.5 K/uL Final  . RBC 12/15/2015 3.46* 3.87 - 5.11 MIL/uL Final  . Hemoglobin 12/15/2015 11.0* 12.0 - 15.0 g/dL Final  . HCT 12/15/2015 30.7* 36.0 - 46.0 % Final  . MCV 12/15/2015 88.7  78.0 - 100.0 fL Final  . MCH 12/15/2015 31.8  26.0 - 34.0 pg Final  . MCHC 12/15/2015 35.8  30.0 - 36.0 g/dL Final  . RDW 12/15/2015 13.8  11.5 - 15.5 % Final  . Platelets 12/15/2015 457* 150 - 400 K/uL  Final  . Sodium 12/15/2015 135  135 - 145 mmol/L Final  . Potassium 12/15/2015 3.1* 3.5 - 5.1 mmol/L Final  . Chloride 12/15/2015 103  101 - 111 mmol/L Final  . CO2 12/15/2015 24  22 - 32 mmol/L Final  . Glucose, Bld 12/15/2015 122* 65 - 99 mg/dL Final  . BUN 12/15/2015 10  6 - 20 mg/dL Final  . Creatinine, Ser 12/15/2015 0.84  0.44 - 1.00 mg/dL Final  . Calcium 12/15/2015 8.2* 8.9 - 10.3 mg/dL Final  . GFR calc non Af Amer 12/15/2015 >60  >60 mL/min Final  . GFR calc Af Amer 12/15/2015 >60  >60 mL/min Final   Comment: (NOTE) The eGFR has been calculated using the CKD EPI equation. This calculation has not been validated in all clinical situations. eGFR's persistently <60 mL/min signify possible Chronic Kidney Disease.   Georgiann Hahn gap 12/15/2015 8  5 - 15 Final  Hospital Outpatient Visit on  12/07/2015  Component Date Value Ref Range Status  . aPTT 12/07/2015 34  24 - 36 seconds Final  . WBC 12/07/2015 4.4  4.0 - 10.5 K/uL Final  . RBC 12/07/2015 4.04  3.87 - 5.11 MIL/uL Final  . Hemoglobin 12/07/2015 12.7  12.0 - 15.0 g/dL Final  . HCT 12/07/2015 37.1  36.0 - 46.0 % Final  . MCV 12/07/2015 91.8  78.0 - 100.0 fL Final  . MCH 12/07/2015 31.4  26.0 - 34.0 pg Final  . MCHC 12/07/2015 34.2  30.0 - 36.0 g/dL Final  . RDW 12/07/2015 13.0  11.5 - 15.5 % Final  . Platelets 12/07/2015 272  150 - 400 K/uL Final  . Sodium 12/07/2015 130* 135 - 145 mmol/L Final  . Potassium 12/07/2015 3.6  3.5 - 5.1 mmol/L Final  . Chloride 12/07/2015 91* 101 - 111 mmol/L Final  . CO2 12/07/2015 32  22 - 32 mmol/L Final  . Glucose, Bld 12/07/2015 90  65 - 99 mg/dL Final  . BUN 12/07/2015 20  6 - 20 mg/dL Final  . Creatinine, Ser 12/07/2015 0.71  0.44 - 1.00 mg/dL Final  . Calcium 12/07/2015 9.5  8.9 - 10.3 mg/dL Final  . Total Protein 12/07/2015 7.4  6.5 - 8.1 g/dL Final  . Albumin 12/07/2015 4.4  3.5 - 5.0 g/dL Final  . AST 12/07/2015 22  15 - 41 U/L Final  . ALT 12/07/2015 15  14 - 54 U/L Final  . Alkaline Phosphatase 12/07/2015 77  38 - 126 U/L Final  . Total Bilirubin 12/07/2015 1.0  0.3 - 1.2 mg/dL Final  . GFR calc non Af Amer 12/07/2015 >60  >60 mL/min Final  . GFR calc Af Amer 12/07/2015 >60  >60 mL/min Final   Comment: (NOTE) The eGFR has been calculated using the CKD EPI equation. This calculation has not been validated in all clinical situations. eGFR's persistently <60 mL/min signify possible Chronic Kidney Disease.   . Anion gap 12/07/2015 7  5 - 15 Final  . Prothrombin Time 12/07/2015 13.0  11.4 - 15.2 seconds Final  . INR 12/07/2015 0.98   Final  . ABO/RH(D) 12/13/2015 O POS   Final  . Antibody Screen 12/13/2015 NEG   Final  . Sample Expiration 12/13/2015 12/16/2015   Final  . Extend sample reason 12/13/2015 NO TRANSFUSIONS OR PREGNANCY IN THE PAST 3 MONTHS   Final  . MRSA,  PCR 12/07/2015 NEGATIVE  NEGATIVE Final  . Staphylococcus aureus 12/07/2015 NEGATIVE  NEGATIVE Final   Comment:  The Xpert SA Assay (FDA approved for NASAL specimens in patients over 24 years of age), is one component of a comprehensive surveillance program.  Test performance has been validated by [facility] for patients greater than or equal to 89 year old. It is not intended to diagnose infection nor to guide or monitor treatment.   . Color, Urine 12/07/2015 YELLOW  YELLOW Final  . APPearance 12/07/2015 CLEAR  CLEAR Final  . Specific Gravity, Urine 12/07/2015 1.012  1.005 - 1.030 Final  . pH 12/07/2015 6.5  5.0 - 8.0 Final  . Glucose, UA 12/07/2015 NEGATIVE  NEGATIVE mg/dL Final  . Hgb urine dipstick 12/07/2015 NEGATIVE  NEGATIVE Final  . Bilirubin Urine 12/07/2015 NEGATIVE  NEGATIVE Final  . Ketones, ur 12/07/2015 NEGATIVE  NEGATIVE mg/dL Final  . Protein, ur 12/07/2015 NEGATIVE  NEGATIVE mg/dL Final  . Nitrite 12/07/2015 NEGATIVE  NEGATIVE Final  . Leukocytes, UA 12/07/2015 TRACE* NEGATIVE Final  . Squamous Epithelial / LPF 12/07/2015 0-5* NONE SEEN Final  . WBC, UA 12/07/2015 0-5  0 - 5 WBC/hpf Final  . RBC / HPF 12/07/2015 0-5  0 - 5 RBC/hpf Final  . Bacteria, UA 12/07/2015 RARE* NONE SEEN Final  . ABO/RH(D) 12/07/2015 O POS   Final     X-Rays:Dg Pelvis Portable  Result Date: 12/13/2015 CLINICAL DATA:  Post left hip arthroplasty EXAM: PORTABLE PELVIS 1-2 VIEWS COMPARISON:  09/07/2014 FINDINGS: Remote changes of right hip replacement. Interval changes of left hip replacement. No hardware or bony complicating feature. Left soft tissue drain in place. Normal AP alignment. IMPRESSION: Left hip replacement.  No complicating feature. Electronically Signed   By: Rolm Baptise M.D.   On: 12/13/2015 13:35   Dg C-arm 1-60 Min-no Report  Result Date: 12/13/2015 CLINICAL DATA: surgery C-ARM 1-60 MINUTES Fluoroscopy was utilized by the requesting physician.  No radiographic  interpretation.    EKG: Orders placed or performed during the hospital encounter of 12/07/15  . EKG 12-Lead  . EKG 12-Lead     Hospital Course: Crystal Ramsey is a 80 y.o. who was admitted to [facility] They were brought to the operating room on 12/13/2015 and underwent Procedure(s): LEFT TOTAL HIP ARTHROPLASTY ANTERIOR APPROACH.  Patient tolerated the procedure well and was later transferred to the recovery room and then to the orthopaedic floor for postoperative care.  They were given PO and IV analgesics for pain control following their surgery.  They were given 24 hours of postoperative antibiotics of  Anti-infectives    Start     Dose/Rate Route Frequency Ordered Stop   12/13/15 1700  ceFAZolin (ANCEF) IVPB 2g/100 mL premix     2 g 200 mL/hr over 30 Minutes Intravenous Every 6 hours 12/13/15 1409 12/14/15 0023   12/13/15 0807  ceFAZolin (ANCEF) IVPB 2g/100 mL premix     2 g 200 mL/hr over 30 Minutes Intravenous On call to O.R. 12/13/15 [id number] 12/13/15 1101     and started on DVT prophylaxis in the form of Xarelto.   PT and OT were ordered for total joint protocol.  Discharge planning consulted to help with postop disposition and equipment needs.  Patient had a decent night on the evening of surgery.  They started to get up OOB with therapy on day one. Hemovac drain was pulled without difficulty.  Continued to work with therapy into day two.  Dressing was changed on day two and the incision was healing well.  Patient was seen in rounds and was ready  to go home. Patient did not want to take the Xarelto at home so she was switch over to full dose Aspirin at discharge.  Discharge home - Does not need Home Health Diet - Cardiac diet Follow up - in 2 weeks Activity - WBAT Disposition - Home Condition Upon Discharge - Good D/C Meds - See DC Summary DVT Prophylaxis - Aspirin 325 mg BID for three weeks and then reduce to baby 81 mg Aspirin for three additional weeks. Will start the  Aspirin on Saturday 12/16/2015.  Discharge Instructions    Call MD / Call 911    Complete by:  As directed   If you experience chest pain or shortness of breath, CALL 911 and be transported to the hospital emergency room.  If you develope a fever above 101 F, pus (white drainage) or increased drainage or redness at the wound, or calf pain, call your surgeon's office.   Change dressing    Complete by:  As directed   You may change your dressing dressing daily with sterile 4 x 4 inch gauze dressing and paper tape.  Do not submerge the incision under water.   Constipation Prevention    Complete by:  As directed   Drink plenty of fluids.  Prune juice may be helpful.  You may use a stool softener, such as Colace (over the counter) 100 mg twice a day.  Use MiraLax (over the counter) for constipation as needed.   Diet - low sodium heart healthy    Complete by:  As directed   Discharge instructions    Complete by:  As directed   Pick up stool softner and laxative for home use following surgery while on pain medications. Do not submerge incision under water. Please use good hand washing techniques while changing dressing each day. May shower starting three days after surgery. Please use a clean towel to pat the incision dry following showers. Continue to use ice for pain and swelling after surgery. Do not use any lotions or creams on the incision until instructed by your surgeon.   Postoperative Constipation Protocol  Constipation - defined medically as fewer than three stools per week and severe constipation as less than one stool per week.  One of the most common issues patients have following surgery is constipation.  Even if you have a regular bowel pattern at home, your normal regimen is likely to be disrupted due to multiple reasons following surgery.  Combination of anesthesia, postoperative narcotics, change in appetite and fluid intake all can affect your bowels.  In order to avoid  complications following surgery, here are some recommendations in order to help you during your recovery period.  Colace (docusate) - Pick up an over-the-counter form of Colace or another stool softener and take twice a day as long as you are requiring postoperative pain medications.  Take with a full glass of water daily.  If you experience loose stools or diarrhea, hold the colace until you stool forms back up.  If your symptoms do not get better within 1 week or if they get worse, check with your doctor.  Dulcolax (bisacodyl) - Pick up over-the-counter and take as directed by the product packaging as needed to assist with the movement of your bowels.  Take with a full glass of water.  Use this product as needed if not relieved by Colace only.   MiraLax (polyethylene glycol) - Pick up over-the-counter to have on hand.  MiraLax is a solution that will increase  the amount of water in your bowels to assist with bowel movements.  Take as directed and can mix with a glass of water, juice, soda, coffee, or tea.  Take if you go more than two days without a movement. Do not use MiraLax more than once per day. Call your doctor if you are still constipated or irregular after using this medication for 7 days in a row.  If you continue to have problems with postoperative constipation, please contact the office for further assistance and recommendations.  If you experience "the worst abdominal pain ever" or develop nausea or vomiting, please contact the office immediatly for further recommendations for treatment.   Take a 325 mg Aspirin twice a day for three weeks and then reduce to a baby 81 mg Aspirin daily for three additional weeks.   Do not sit on low chairs, stoools or toilet seats, as it may be difficult to get up from low surfaces    Complete by:  As directed   Driving restrictions    Complete by:  As directed   No driving until released by the physician.   Increase activity slowly as tolerated     Complete by:  As directed   Lifting restrictions    Complete by:  As directed   No lifting until released by the physician.   Patient may shower    Complete by:  As directed   You may shower without a dressing once there is no drainage.  Do not wash over the wound.  If drainage remains, do not shower until drainage stops.   TED hose    Complete by:  As directed   Use stockings (TED hose) for 3 weeks on both leg(s).  You may remove them at night for sleeping.   Weight bearing as tolerated    Complete by:  As directed   Extremity:  Lower       Medication List    STOP taking these medications   multivitamin with minerals Tabs tablet     TAKE these medications   amiloride-hydrochlorothiazide 5-50 MG tablet Commonly known as:  MODURETIC Take 0.75 tablets by mouth daily. Takes 3/4  tablet   aspirin EC 325 MG tablet Take 1 tablet (325 mg total) by mouth 2 (two) times daily. Take a 325 mg Aspirin twice a day for three weeks, then reduce to a baby 81 mg Aspirin daily for three additional weeks.   HYDROcodone-acetaminophen 7.5-325 MG tablet Commonly known as:  NORCO Take 1-2 tablets by mouth every 4 (four) hours as needed for moderate pain.   methocarbamol 500 MG tablet Commonly known as:  ROBAXIN Take 1 tablet (500 mg total) by mouth every 6 (six) hours as needed for muscle spasms.   omeprazole 20 MG tablet Commonly known as:  PRILOSEC OTC Take 20 mg by mouth every other day. As needed   potassium chloride 10 MEQ tablet Commonly known as:  K-DUR Take 10 mEq by mouth daily.   traMADol 50 MG tablet Commonly known as:  ULTRAM Take 1-2 tablets (50-100 mg total) by mouth every 6 (six) hours as needed (mild pain).   TUMS E-X 750 750 MG chewable tablet Generic drug:  calcium carbonate Chew 2 tablets by mouth daily.      Follow-up Information    Gearlean Alf, MD. Schedule an appointment as soon as possible for a visit on 12/26/2015.   Specialty:  Orthopedic Surgery Why:  Call  office at [phone] to setup appointment on Tuesday  12/26/2015 with Dr. Wynelle Link. Contact information: [address] [place] [address] [phone]           Signed: [name], PA-C Orthopaedic Surgery 12/15/2015, 8:34 AM

## 2015-12-15 NOTE — Progress Notes (Signed)
Date: December 15, 2015 No needs present at time of discharge. Velva Harman, RN, BSN, [place]   [phone]

## 2015-12-15 NOTE — Progress Notes (Signed)
Date: December 15, 2015 No needs present at time of discharge.  Bundled patient has needed equipment at home. Velva Harman, RN, BSN, [place]   [phone]

## 2015-12-15 NOTE — Progress Notes (Signed)
   Subjective: 2 Days Post-Op Procedure(s) (LRB): LEFT TOTAL HIP ARTHROPLASTY ANTERIOR APPROACH (Left) Patient reports pain as mild.   Patient seen in rounds with Dr. Wynelle Link. Patient is well, but has had some minor complaints of pain in the hip and thigh, requiring pain medications Patient is ready to go home.  She prefers not to take the Xarelto at home.  Discussed with Dr. Wynelle Link and will switch to Aspirin 325 mg BID for three weeks starting tomorrow and then a baby Aspirin for three additional weeks to follow.  Objective: Vital signs in last 24 hours: Temp:  [98.1 F (36.7 C)-99.7 F (37.6 C)] 98.6 F (37 C) (08/04 0544) Pulse Rate:  [70-93] 83 (08/04 0544) Resp:  [15-16] 16 (08/04 0544) BP: (133-167)/(49-85) 167/73 (08/04 0544) SpO2:  [96 %-99 %] 96 % (08/04 0544)  Intake/Output from previous day:  Intake/Output Summary (Last 24 hours) at 12/15/15 0822 Last data filed at 12/15/15 0803  Gross per 24 hour  Intake              630 ml  Output              650 ml  Net              -20 ml    Intake/Output this shift: Total I/O In: 50 [P.O.:50] Out: -   Labs:  Recent Labs  12/14/15 0415 12/15/15 0509  HGB 11.0* 11.0*    Recent Labs  12/14/15 0415 12/15/15 0509  WBC 7.9 22.2*  RBC 3.48* 3.46*  HCT 31.6* 30.7*  PLT 235 457*    Recent Labs  12/14/15 0415 12/15/15 0509  NA 133* 135  K 2.9* 3.1*  CL 97* 103  CO2 28 24  BUN 13 10  CREATININE 0.62 0.84  GLUCOSE 131* 122*  CALCIUM 8.3* 8.2*   No results for input(s): LABPT, INR in the last 72 hours.  EXAM: General - Patient is Alert, Appropriate and Oriented Extremity - Neurovascular intact Sensation intact distally Incision - clean, dry, healing Motor Function - intact, moving foot and toes well on exam.   Assessment/Plan: 2 Days Post-Op Procedure(s) (LRB): LEFT TOTAL HIP ARTHROPLASTY ANTERIOR APPROACH (Left) Procedure(s) (LRB): LEFT TOTAL HIP ARTHROPLASTY ANTERIOR APPROACH (Left) Past Medical  History:  Diagnosis Date  . Arthritis   . Back pain   . Complication of anesthesia   . Constipation, chronic   . DES exposure in utero   . Family history of adverse reaction to anesthesia    VOMITING  . Fibromyalgia    PT THINKS SHE HAS FIBROMYALGIA  . GERD (gastroesophageal reflux disease)   . 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 Hypertension   . Lichen plano-pilaris   . PONV (postoperative nausea and vomiting)    Active Problems:   OA (osteoarthritis) of hip  Estimated body mass index is 30.05 kg/m as calculated from the following:   Height as of this encounter: 5' 1.75" (1.568 m).   Weight as of this encounter: 73.9 kg (163 lb). Up with therapy Discharge home - Does not need Home Health Diet - Cardiac diet Follow up - in 2 weeks Activity - WBAT Disposition - Home Condition Upon Discharge - Good D/C Meds - See DC Summary DVT Prophylaxis - Aspirin 325 mg BID for three weeks and then reduce to baby 81 mg Aspirin for three additional weeks.  Crystal Muslim, PA-C Orthopaedic Surgery 12/15/2015, 8:22 AM

## 2015-12-15 NOTE — Progress Notes (Signed)
Physical Therapy Treatment Patient Details Name: Crystal Ramsey MRN: [id number] DOB: 1935/07/06 Today's Date: 12/15/2015    History of Present Illness Pt s/p L THR with hx of R THR and TKR    PT Comments    Pt progressing steadily with mobility.  Reviewed therex, stairs and car transfers.  Follow Up Recommendations        Equipment Recommendations  None recommended by PT    Recommendations for Other Services       Precautions / Restrictions Precautions Precautions: Fall Restrictions Weight Bearing Restrictions: No Other Position/Activity Restrictions: WBAT    Mobility  Bed Mobility Overal bed mobility: Needs Assistance Bed Mobility: Supine to Sit     Supine to sit: Min assist     General bed mobility comments: min cues for sequence and use of R LE to self assist  Transfers Overall transfer level: Needs assistance Equipment used: Rolling walker (2 wheeled) Transfers: Sit to/from Stand Sit to Stand: Min guard         General transfer comment: cues for LE management and use of UEs to self assist  Ambulation/Gait Ambulation/Gait assistance: Min guard;Supervision Ambulation Distance (Feet): 65 Feet Assistive device: Rolling walker (2 wheeled) Gait Pattern/deviations: Step-to pattern;Decreased step length - right;Decreased step length - left;Shuffle;Trunk flexed Gait velocity: decr   General Gait Details: cues for posture, position from RW and initial sequence   Stairs Stairs: Yes Stairs assistance: Min assist Stair Management: No rails;Two rails;Step to pattern;One rail Left;Forwards;With walker;With cane Number of Stairs: 5 General stair comments: single step fwd with RW, 2 steps with bilat rails and 2 steps with rail and cane.  Cues for sequence and foot placement  Wheelchair Mobility    Modified Rankin (Stroke Patients Only)       Balance                                    Cognition Arousal/Alertness: Awake/alert Behavior  During Therapy: WFL for tasks assessed/performed Overall Cognitive Status: Within Functional Limits for tasks assessed                      Exercises Total Joint Exercises Ankle Circles/Pumps: AROM;Both;15 reps;Supine Quad Sets: AROM;Both;10 reps;Supine Heel Slides: AAROM;Right;20 reps;Supine Hip ABduction/ADduction: AAROM;Right;15 reps;Supine    General Comments        Pertinent Vitals/Pain Pain Assessment: 0-10 Pain Score: 5  Pain Location: L hip Pain Descriptors / Indicators: Aching;Sore Pain Intervention(s): Limited activity within patient's tolerance;Monitored during session;Premedicated before session;Ice applied    Home Living                      Prior Function            PT Goals (current goals can now be found in the care plan section) Acute Rehab PT Goals Patient Stated Goal: Regain IND and walk without pain PT Goal Formulation: With patient Time For Goal Achievement: 12/16/15 Potential to Achieve Goals: Good Progress towards PT goals: Progressing toward goals    Frequency  7X/week    PT Plan Current plan remains appropriate    Co-evaluation             End of Session Equipment Utilized During Treatment: Gait belt Activity Tolerance: Patient tolerated treatment well Patient left: in chair;with call bell/phone within reach;with chair alarm set     Time: [id number] PT Time Calculation (min) (ACUTE ONLY):  45 min  Charges:  $Gait Training: 8-22 mins $Therapeutic Exercise: 8-22 mins $Therapeutic Activity: 8-22 mins                    G Codes:      Ry Moody 01/13/16, 12:49 PM

## 2015-12-26 DIAGNOSIS — Z471 Aftercare following joint replacement surgery: Secondary | ICD-10-CM | POA: Diagnosis not present

## 2015-12-26 DIAGNOSIS — Z96642 Presence of left artificial hip joint: Secondary | ICD-10-CM | POA: Diagnosis not present

## 2016-01-18 DIAGNOSIS — Z96642 Presence of left artificial hip joint: Secondary | ICD-10-CM | POA: Diagnosis not present

## 2016-01-18 DIAGNOSIS — Z471 Aftercare following joint replacement surgery: Secondary | ICD-10-CM | POA: Diagnosis not present

## 2016-01-29 ENCOUNTER — Encounter: Payer: Self-pay | Admitting: Gynecology

## 2016-01-29 DIAGNOSIS — Z1231 Encounter for screening mammogram for malignant neoplasm of breast: Secondary | ICD-10-CM | POA: Diagnosis not present

## 2016-01-29 DIAGNOSIS — Z803 Family history of malignant neoplasm of breast: Secondary | ICD-10-CM | POA: Diagnosis not present

## 2016-02-05 ENCOUNTER — Encounter: Payer: Self-pay | Admitting: Gynecology

## 2016-02-05 ENCOUNTER — Ambulatory Visit (INDEPENDENT_AMBULATORY_CARE_PROVIDER_SITE_OTHER): Payer: Medicare Other | Admitting: Gynecology

## 2016-02-05 VITALS — BP 120/76 | Ht 62.0 in | Wt 160.0 lb

## 2016-02-05 DIAGNOSIS — Z779 Other contact with and (suspected) exposures hazardous to health: Secondary | ICD-10-CM

## 2016-02-05 DIAGNOSIS — N952 Postmenopausal atrophic vaginitis: Secondary | ICD-10-CM

## 2016-02-05 DIAGNOSIS — Z9189 Other specified personal risk factors, not elsewhere classified: Secondary | ICD-10-CM | POA: Diagnosis not present

## 2016-02-05 DIAGNOSIS — Z01419 Encounter for gynecological examination (general) (routine) without abnormal findings: Secondary | ICD-10-CM

## 2016-02-05 DIAGNOSIS — N762 Acute vulvitis: Secondary | ICD-10-CM

## 2016-02-05 NOTE — Progress Notes (Signed)
    Crystal Ramsey Nov 06, 1935 [id number]        80 y.o.  G2P1011  for breast and pelvic exam.  Past medical history,surgical history, problem list, medications, allergies, family history and social history were all reviewed and documented as reviewed in the EPIC chart.  ROS:  Performed with pertinent positives and negatives included in the history, assessment and plan.   Additional significant findings :  None   Exam: [name] assistant Vitals:   02/05/16 1019  BP: 120/76  Weight: 160 lb (72.6 kg)  Height: 5\' 2"  (1.575 m)   Body mass index is 29.26 kg/m.  General appearance:  Normal affect, orientation and appearance. Skin: Grossly normal HEENT: Without gross lesions.  No cervical or supraclavicular adenopathy. Thyroid normal.  Lungs:  Clear without wheezing, rales or rhonchi Cardiac: RR, without RMG Abdominal:  Soft, nontender, without masses, guarding, rebound, organomegaly or hernia Breasts:  Examined lying and sitting without masses, retractions, discharge or axillary adenopathy. Pelvic:  Ext, BUS, Vagina with atrophic changes. Pap smear of cuff done  Adnexa without masses or tenderness    Anus and perineum normal   Rectovaginal normal sphincter tone without palpated masses or tenderness.    Assessment/Plan:  80 y.o. G27P1011 female for breast and pelvic exam.   1. Postmenopausal/atrophic genital changes. Status post TVH in the past. Without significant hot flushes, night sweats or vaginal dryness. 2. Occasional vulvitis that she uses Temovate 0.05% cream. Has refill at home but will call if needs more. Exam today shows atrophic changes but no other significant findings. 3. 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 Pap smear done today. Continue with annual Pap smears. No history of abnormal Pap smears previously. 4. Mammography 01/2016. Continue with annual mammography when due. SBE monthly reviewed. 5. DEXA 2015 normal. Plan repeat at 5 year interval. 6. Colonoscopy 9 years ago  with planned repeat next year. 7. Health maintenance. No routine lab work done as patient does this elsewhere. Follow up one year, sooner as needed.   Anastasio Auerbach MD, 10:50 AM 02/05/2016

## 2016-02-05 NOTE — Patient Instructions (Signed)

## 2016-02-05 NOTE — Addendum Note (Signed)
Addended by: Nelva Nay on: 02/05/2016 11:15 AM   Modules accepted: Orders

## 2016-02-06 DIAGNOSIS — M76822 Posterior tibial tendinitis, left leg: Secondary | ICD-10-CM | POA: Diagnosis not present

## 2016-02-06 DIAGNOSIS — M76821 Posterior tibial tendinitis, right leg: Secondary | ICD-10-CM | POA: Diagnosis not present

## 2016-02-06 LAB — PAP IG W/ RFLX HPV ASCU

## 2016-02-25 IMAGING — DX DG PORTABLE PELVIS
1 series · 1 of 1 positions shown · non-contrast
Comparison: None.

CLINICAL DATA: Status post right hip arthroplasty.

EXAM:
PORTABLE PELVIS 1-2 VIEWS

[pelvis ap]
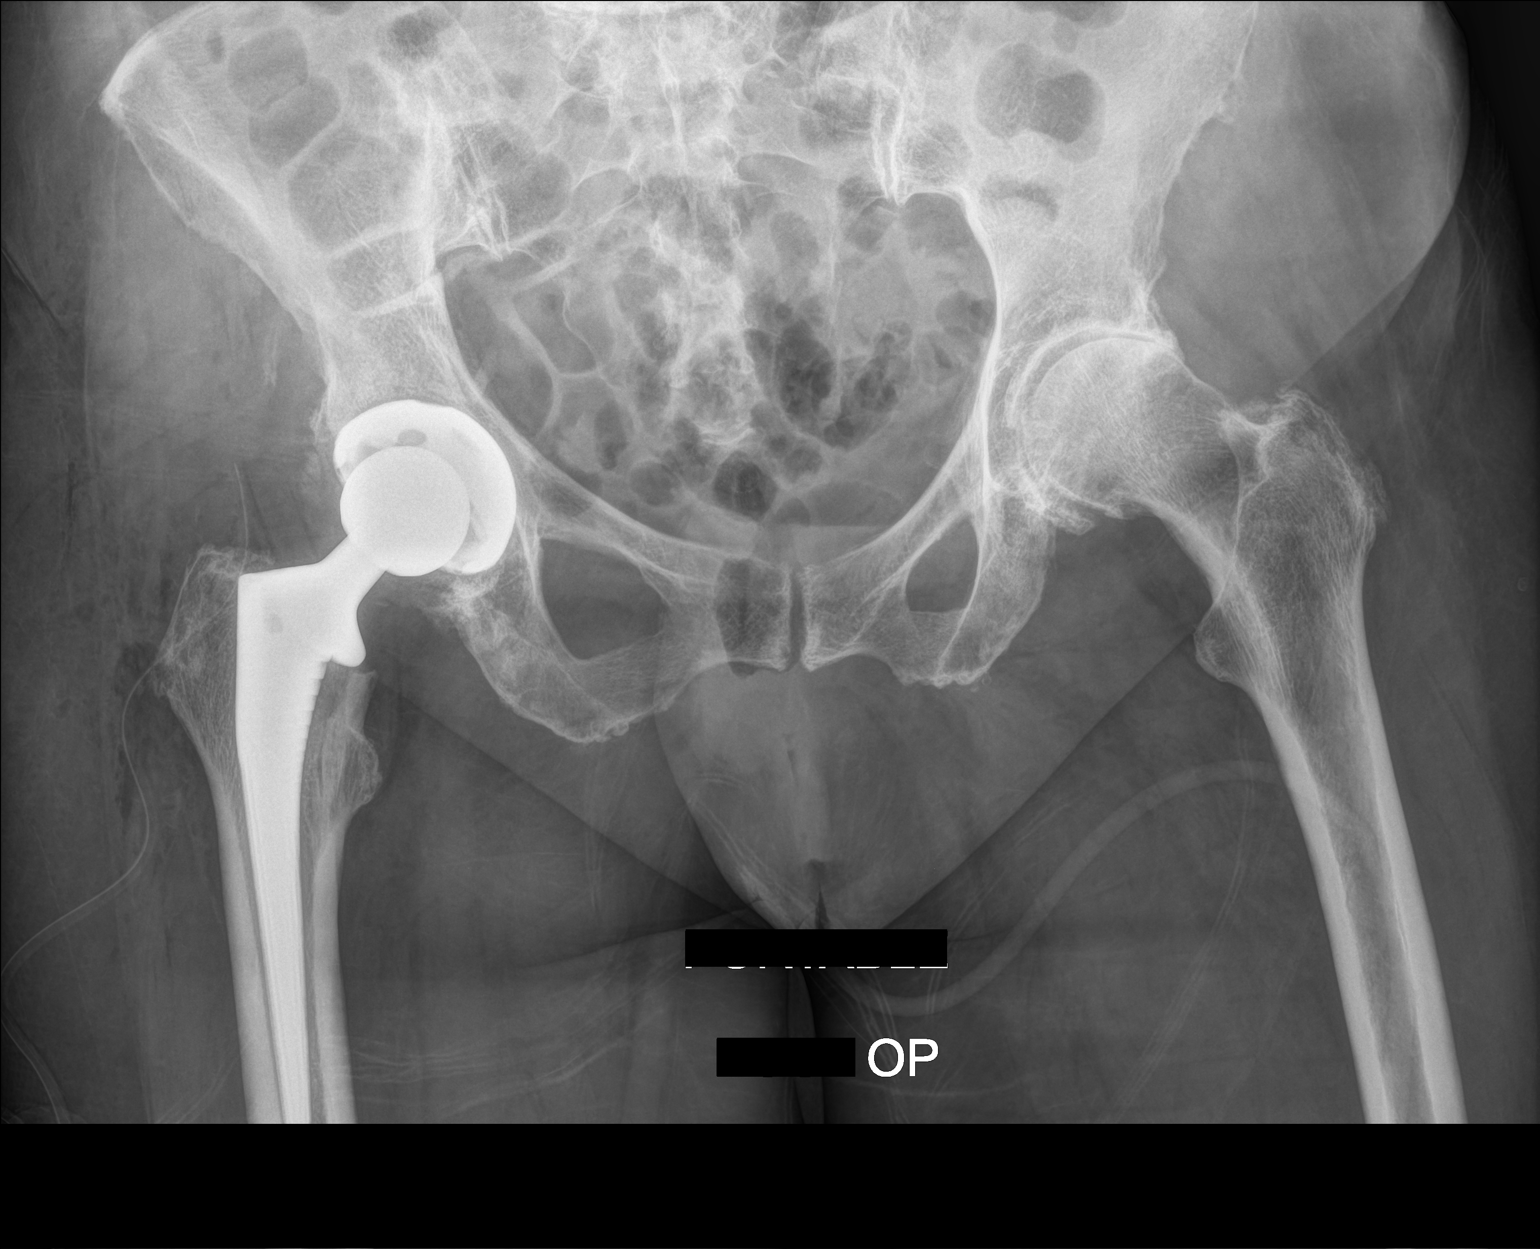

[1 of 1 positions shown; findings below may reference images not displayed]

FINDINGS: Right hip non cemented prosthetic components are well-seated and
aligned. There is no acute fracture or evidence of an operative
complication.
IMPRESSION: Well-aligned right hip arthroplasty.

## 2016-02-27 DIAGNOSIS — Z96642 Presence of left artificial hip joint: Secondary | ICD-10-CM | POA: Diagnosis not present

## 2016-02-27 DIAGNOSIS — Z471 Aftercare following joint replacement surgery: Secondary | ICD-10-CM | POA: Diagnosis not present

## 2016-03-05 DIAGNOSIS — H838X3 Other specified diseases of inner ear, bilateral: Secondary | ICD-10-CM | POA: Diagnosis not present

## 2016-05-10 DIAGNOSIS — Z23 Encounter for immunization: Secondary | ICD-10-CM | POA: Diagnosis not present

## 2016-05-28 DIAGNOSIS — Z1389 Encounter for screening for other disorder: Secondary | ICD-10-CM | POA: Diagnosis not present

## 2016-05-28 DIAGNOSIS — Z0000 Encounter for general adult medical examination without abnormal findings: Secondary | ICD-10-CM | POA: Diagnosis not present

## 2016-06-05 DIAGNOSIS — M549 Dorsalgia, unspecified: Secondary | ICD-10-CM | POA: Diagnosis not present

## 2016-06-05 DIAGNOSIS — M519 Unspecified thoracic, thoracolumbar and lumbosacral intervertebral disc disorder: Secondary | ICD-10-CM | POA: Diagnosis not present

## 2016-06-06 DIAGNOSIS — M76821 Posterior tibial tendinitis, right leg: Secondary | ICD-10-CM | POA: Diagnosis not present

## 2016-06-06 DIAGNOSIS — M79671 Pain in right foot: Secondary | ICD-10-CM | POA: Diagnosis not present

## 2016-07-30 DIAGNOSIS — M79671 Pain in right foot: Secondary | ICD-10-CM | POA: Diagnosis not present

## 2016-07-30 DIAGNOSIS — M76821 Posterior tibial tendinitis, right leg: Secondary | ICD-10-CM | POA: Diagnosis not present

## 2016-07-30 DIAGNOSIS — M7661 Achilles tendinitis, right leg: Secondary | ICD-10-CM | POA: Diagnosis not present

## 2016-08-02 DIAGNOSIS — M5416 Radiculopathy, lumbar region: Secondary | ICD-10-CM | POA: Diagnosis not present

## 2016-08-02 DIAGNOSIS — G894 Chronic pain syndrome: Secondary | ICD-10-CM | POA: Diagnosis not present

## 2016-08-02 DIAGNOSIS — M545 Low back pain: Secondary | ICD-10-CM | POA: Diagnosis not present

## 2016-08-13 DIAGNOSIS — Z0000 Encounter for general adult medical examination without abnormal findings: Secondary | ICD-10-CM | POA: Diagnosis not present

## 2016-08-13 DIAGNOSIS — K219 Gastro-esophageal reflux disease without esophagitis: Secondary | ICD-10-CM | POA: Diagnosis not present

## 2016-08-13 DIAGNOSIS — Z1389 Encounter for screening for other disorder: Secondary | ICD-10-CM | POA: Diagnosis not present

## 2016-08-13 DIAGNOSIS — I10 Essential (primary) hypertension: Secondary | ICD-10-CM | POA: Diagnosis not present

## 2016-08-29 DIAGNOSIS — M25562 Pain in left knee: Secondary | ICD-10-CM | POA: Diagnosis not present

## 2016-08-29 DIAGNOSIS — M545 Low back pain: Secondary | ICD-10-CM | POA: Diagnosis not present

## 2016-08-29 DIAGNOSIS — G894 Chronic pain syndrome: Secondary | ICD-10-CM | POA: Diagnosis not present

## 2016-09-26 DIAGNOSIS — M545 Low back pain: Secondary | ICD-10-CM | POA: Diagnosis not present

## 2016-09-26 DIAGNOSIS — G894 Chronic pain syndrome: Secondary | ICD-10-CM | POA: Diagnosis not present

## 2016-09-26 DIAGNOSIS — M25562 Pain in left knee: Secondary | ICD-10-CM | POA: Diagnosis not present

## 2016-10-25 DIAGNOSIS — M25551 Pain in right hip: Secondary | ICD-10-CM | POA: Diagnosis not present

## 2016-10-25 DIAGNOSIS — M545 Low back pain: Secondary | ICD-10-CM | POA: Diagnosis not present

## 2016-10-25 DIAGNOSIS — M25562 Pain in left knee: Secondary | ICD-10-CM | POA: Diagnosis not present

## 2016-10-25 DIAGNOSIS — G894 Chronic pain syndrome: Secondary | ICD-10-CM | POA: Diagnosis not present

## 2016-10-29 DIAGNOSIS — H25043 Posterior subcapsular polar age-related cataract, bilateral: Secondary | ICD-10-CM | POA: Diagnosis not present

## 2016-10-29 DIAGNOSIS — H5213 Myopia, bilateral: Secondary | ICD-10-CM | POA: Diagnosis not present

## 2016-10-29 DIAGNOSIS — Z961 Presence of intraocular lens: Secondary | ICD-10-CM | POA: Diagnosis not present

## 2016-10-29 DIAGNOSIS — H25013 Cortical age-related cataract, bilateral: Secondary | ICD-10-CM | POA: Diagnosis not present

## 2016-11-25 DIAGNOSIS — M545 Low back pain: Secondary | ICD-10-CM | POA: Diagnosis not present

## 2016-11-25 DIAGNOSIS — G894 Chronic pain syndrome: Secondary | ICD-10-CM | POA: Diagnosis not present

## 2016-11-25 DIAGNOSIS — M5416 Radiculopathy, lumbar region: Secondary | ICD-10-CM | POA: Diagnosis not present

## 2016-12-03 DIAGNOSIS — M1712 Unilateral primary osteoarthritis, left knee: Secondary | ICD-10-CM | POA: Diagnosis not present

## 2017-01-06 DIAGNOSIS — D1801 Hemangioma of skin and subcutaneous tissue: Secondary | ICD-10-CM | POA: Diagnosis not present

## 2017-01-06 DIAGNOSIS — D225 Melanocytic nevi of trunk: Secondary | ICD-10-CM | POA: Diagnosis not present

## 2017-01-06 DIAGNOSIS — L821 Other seborrheic keratosis: Secondary | ICD-10-CM | POA: Diagnosis not present

## 2017-01-06 DIAGNOSIS — L720 Epidermal cyst: Secondary | ICD-10-CM | POA: Diagnosis not present

## 2017-01-06 DIAGNOSIS — L570 Actinic keratosis: Secondary | ICD-10-CM | POA: Diagnosis not present

## 2017-01-06 DIAGNOSIS — D485 Neoplasm of uncertain behavior of skin: Secondary | ICD-10-CM | POA: Diagnosis not present

## 2017-01-06 DIAGNOSIS — L738 Other specified follicular disorders: Secondary | ICD-10-CM | POA: Diagnosis not present

## 2017-01-16 DIAGNOSIS — Z96642 Presence of left artificial hip joint: Secondary | ICD-10-CM | POA: Diagnosis not present

## 2017-01-16 DIAGNOSIS — M1612 Unilateral primary osteoarthritis, left hip: Secondary | ICD-10-CM | POA: Diagnosis not present

## 2017-01-16 DIAGNOSIS — M1712 Unilateral primary osteoarthritis, left knee: Secondary | ICD-10-CM | POA: Diagnosis not present

## 2017-01-16 DIAGNOSIS — Z471 Aftercare following joint replacement surgery: Secondary | ICD-10-CM | POA: Diagnosis not present

## 2017-01-30 DIAGNOSIS — Z1231 Encounter for screening mammogram for malignant neoplasm of breast: Secondary | ICD-10-CM | POA: Diagnosis not present

## 2017-01-30 DIAGNOSIS — Z803 Family history of malignant neoplasm of breast: Secondary | ICD-10-CM | POA: Diagnosis not present

## 2017-02-06 ENCOUNTER — Encounter: Payer: Self-pay | Admitting: Gynecology

## 2017-02-06 ENCOUNTER — Ambulatory Visit (INDEPENDENT_AMBULATORY_CARE_PROVIDER_SITE_OTHER): Payer: Medicare Other | Admitting: Gynecology

## 2017-02-06 VITALS — BP 120/76 | Ht 62.0 in | Wt 166.0 lb

## 2017-02-06 DIAGNOSIS — N952 Postmenopausal atrophic vaginitis: Secondary | ICD-10-CM

## 2017-02-06 DIAGNOSIS — Z01411 Encounter for gynecological examination (general) (routine) with abnormal findings: Secondary | ICD-10-CM | POA: Diagnosis not present

## 2017-02-06 DIAGNOSIS — Z9189 Other specified personal risk factors, not elsewhere classified: Secondary | ICD-10-CM

## 2017-02-06 DIAGNOSIS — Z1272 Encounter for screening for malignant neoplasm of vagina: Secondary | ICD-10-CM

## 2017-02-06 NOTE — Progress Notes (Signed)
    BECCI BATTY [birth year] [id number]        81 y.o.  G2P1011 for breast and pelvic exam.  Past medical history,surgical history, problem list, medications, allergies, family history and social history were all reviewed and documented as reviewed in the EPIC chart.  ROS:  Performed with pertinent positives and negatives included in the history, assessment and plan.   Additional significant findings :  None   Exam: [name] assistant Vitals:   02/06/17 1526  BP: 120/76  Weight: 166 lb (75.3 kg)  Height: 5\' 2"  (1.575 m)   Body mass index is 30.36 kg/m.  General appearance:  Normal affect, orientation and appearance. Skin: Grossly normal HEENT: Without gross lesions.  No cervical or supraclavicular adenopathy. Thyroid normal.  Lungs:  Clear without wheezing, rales or rhonchi Cardiac: RR, without RMG Abdominal:  Soft, nontender, without masses, guarding, rebound, organomegaly or hernia Breasts:  Examined lying and sitting without masses, retractions, discharge or axillary adenopathy. Pelvic:  Ext, BUS, Vagina: With atrophic changes. Pap smear of vaginal cuff done  Adnexa: Without masses or tenderness    Anus and perineum: Normal   Rectovaginal: Normal sphincter tone without palpated masses or tenderness.    Assessment/Plan:  81 y.o. G42P1011 female for breast and pelvic exam.   1. Postmenopausal/atrophic genital changes. History of TVH in the past. No significant hot flushes, night sweats or vaginal dryness. 2. 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 Pap smear of vaginal cuff done today. We'll continue with annual Pap smears. No history of abnormal Pap smears previously. 3. Occasional vulvitis for which she uses Temovate 0.05% cream. Has supply at home but will call when she needs more. No significant findings other than atrophy on exam today. 4. Mammography 01/2017. Continue with annual mammography next year. Breast exam normal today. 5. Colonoscopy 10 years ago. She had talk to her  primary physician and they both have decided that she will stop screening based on her age and negative history. She'll continue to discuss this with him on an annual basis. 6. DEXA 2015 normal. Plan repeat DEXA at 5 year interval. 7. Health maintenance. No routine lab work done as patient does this elsewhere. Follow up 1 year, sooner as needed.   Anastasio Auerbach MD, 3:58 PM 02/06/2017

## 2017-02-06 NOTE — Addendum Note (Signed)
Addended by: Nelva Nay on: 02/06/2017 04:31 PM   Modules accepted: Orders

## 2017-02-06 NOTE — Patient Instructions (Signed)
Followup in one year for annual exam, sooner if any issues 

## 2017-02-07 LAB — PAP IG W/ RFLX HPV ASCU

## 2017-02-12 DIAGNOSIS — M545 Low back pain: Secondary | ICD-10-CM | POA: Diagnosis not present

## 2017-02-12 DIAGNOSIS — M5416 Radiculopathy, lumbar region: Secondary | ICD-10-CM | POA: Diagnosis not present

## 2017-02-12 DIAGNOSIS — G894 Chronic pain syndrome: Secondary | ICD-10-CM | POA: Diagnosis not present

## 2017-02-18 DIAGNOSIS — I10 Essential (primary) hypertension: Secondary | ICD-10-CM | POA: Diagnosis not present

## 2017-02-18 DIAGNOSIS — Z23 Encounter for immunization: Secondary | ICD-10-CM | POA: Diagnosis not present

## 2017-02-18 DIAGNOSIS — M5136 Other intervertebral disc degeneration, lumbar region: Secondary | ICD-10-CM | POA: Diagnosis not present

## 2017-02-18 DIAGNOSIS — M4696 Unspecified inflammatory spondylopathy, lumbar region: Secondary | ICD-10-CM | POA: Diagnosis not present

## 2017-03-11 DIAGNOSIS — H9011 Conductive hearing loss, unilateral, right ear, with unrestricted hearing on the contralateral side: Secondary | ICD-10-CM | POA: Diagnosis not present

## 2017-03-11 DIAGNOSIS — J310 Chronic rhinitis: Secondary | ICD-10-CM | POA: Diagnosis not present

## 2017-03-11 DIAGNOSIS — H6521 Chronic serous otitis media, right ear: Secondary | ICD-10-CM | POA: Diagnosis not present

## 2017-03-11 DIAGNOSIS — J343 Hypertrophy of nasal turbinates: Secondary | ICD-10-CM | POA: Diagnosis not present

## 2017-03-11 DIAGNOSIS — H6123 Impacted cerumen, bilateral: Secondary | ICD-10-CM | POA: Diagnosis not present

## 2017-04-15 DIAGNOSIS — H6983 Other specified disorders of Eustachian tube, bilateral: Secondary | ICD-10-CM | POA: Diagnosis not present

## 2017-04-15 DIAGNOSIS — H903 Sensorineural hearing loss, bilateral: Secondary | ICD-10-CM | POA: Diagnosis not present

## 2017-04-15 DIAGNOSIS — H838X3 Other specified diseases of inner ear, bilateral: Secondary | ICD-10-CM | POA: Diagnosis not present

## 2017-06-20 DIAGNOSIS — M419 Scoliosis, unspecified: Secondary | ICD-10-CM | POA: Diagnosis not present

## 2017-06-20 DIAGNOSIS — M5136 Other intervertebral disc degeneration, lumbar region: Secondary | ICD-10-CM | POA: Diagnosis not present

## 2017-06-20 DIAGNOSIS — M545 Low back pain: Secondary | ICD-10-CM | POA: Diagnosis not present

## 2017-06-20 DIAGNOSIS — M7061 Trochanteric bursitis, right hip: Secondary | ICD-10-CM | POA: Diagnosis not present

## 2017-07-15 DIAGNOSIS — M25551 Pain in right hip: Secondary | ICD-10-CM | POA: Diagnosis not present

## 2017-08-19 DIAGNOSIS — K219 Gastro-esophageal reflux disease without esophagitis: Secondary | ICD-10-CM | POA: Diagnosis not present

## 2017-08-19 DIAGNOSIS — Z1389 Encounter for screening for other disorder: Secondary | ICD-10-CM | POA: Diagnosis not present

## 2017-08-19 DIAGNOSIS — I10 Essential (primary) hypertension: Secondary | ICD-10-CM | POA: Diagnosis not present

## 2017-08-19 DIAGNOSIS — Z0000 Encounter for general adult medical examination without abnormal findings: Secondary | ICD-10-CM | POA: Diagnosis not present

## 2017-08-19 DIAGNOSIS — M4696 Unspecified inflammatory spondylopathy, lumbar region: Secondary | ICD-10-CM | POA: Diagnosis not present

## 2017-09-03 DIAGNOSIS — M150 Primary generalized (osteo)arthritis: Secondary | ICD-10-CM | POA: Diagnosis not present

## 2017-09-03 DIAGNOSIS — G894 Chronic pain syndrome: Secondary | ICD-10-CM | POA: Diagnosis not present

## 2017-09-03 DIAGNOSIS — M47816 Spondylosis without myelopathy or radiculopathy, lumbar region: Secondary | ICD-10-CM | POA: Diagnosis not present

## 2017-09-03 DIAGNOSIS — Z79891 Long term (current) use of opiate analgesic: Secondary | ICD-10-CM | POA: Diagnosis not present

## 2017-10-08 DIAGNOSIS — G894 Chronic pain syndrome: Secondary | ICD-10-CM | POA: Diagnosis not present

## 2017-10-08 DIAGNOSIS — M47816 Spondylosis without myelopathy or radiculopathy, lumbar region: Secondary | ICD-10-CM | POA: Diagnosis not present

## 2017-10-08 DIAGNOSIS — Z79891 Long term (current) use of opiate analgesic: Secondary | ICD-10-CM | POA: Diagnosis not present

## 2017-10-08 DIAGNOSIS — M150 Primary generalized (osteo)arthritis: Secondary | ICD-10-CM | POA: Diagnosis not present

## 2017-11-06 DIAGNOSIS — M7061 Trochanteric bursitis, right hip: Secondary | ICD-10-CM | POA: Diagnosis not present

## 2017-11-06 DIAGNOSIS — M25551 Pain in right hip: Secondary | ICD-10-CM | POA: Diagnosis not present

## 2017-12-02 DIAGNOSIS — H5213 Myopia, bilateral: Secondary | ICD-10-CM | POA: Diagnosis not present

## 2017-12-02 DIAGNOSIS — Z961 Presence of intraocular lens: Secondary | ICD-10-CM | POA: Diagnosis not present

## 2017-12-02 DIAGNOSIS — H2511 Age-related nuclear cataract, right eye: Secondary | ICD-10-CM | POA: Diagnosis not present

## 2017-12-02 DIAGNOSIS — H25041 Posterior subcapsular polar age-related cataract, right eye: Secondary | ICD-10-CM | POA: Diagnosis not present

## 2017-12-03 DIAGNOSIS — G894 Chronic pain syndrome: Secondary | ICD-10-CM | POA: Diagnosis not present

## 2017-12-03 DIAGNOSIS — Z79891 Long term (current) use of opiate analgesic: Secondary | ICD-10-CM | POA: Diagnosis not present

## 2017-12-03 DIAGNOSIS — M150 Primary generalized (osteo)arthritis: Secondary | ICD-10-CM | POA: Diagnosis not present

## 2017-12-03 DIAGNOSIS — M47816 Spondylosis without myelopathy or radiculopathy, lumbar region: Secondary | ICD-10-CM | POA: Diagnosis not present

## 2017-12-16 DIAGNOSIS — H25811 Combined forms of age-related cataract, right eye: Secondary | ICD-10-CM | POA: Diagnosis not present

## 2018-01-08 DIAGNOSIS — H25811 Combined forms of age-related cataract, right eye: Secondary | ICD-10-CM | POA: Diagnosis not present

## 2018-01-08 DIAGNOSIS — H268 Other specified cataract: Secondary | ICD-10-CM | POA: Diagnosis not present

## 2018-02-03 DIAGNOSIS — M47816 Spondylosis without myelopathy or radiculopathy, lumbar region: Secondary | ICD-10-CM | POA: Diagnosis not present

## 2018-02-03 DIAGNOSIS — Z79891 Long term (current) use of opiate analgesic: Secondary | ICD-10-CM | POA: Diagnosis not present

## 2018-02-03 DIAGNOSIS — M150 Primary generalized (osteo)arthritis: Secondary | ICD-10-CM | POA: Diagnosis not present

## 2018-02-03 DIAGNOSIS — G894 Chronic pain syndrome: Secondary | ICD-10-CM | POA: Diagnosis not present

## 2018-02-04 DIAGNOSIS — Z803 Family history of malignant neoplasm of breast: Secondary | ICD-10-CM | POA: Diagnosis not present

## 2018-02-04 DIAGNOSIS — Z1231 Encounter for screening mammogram for malignant neoplasm of breast: Secondary | ICD-10-CM | POA: Diagnosis not present

## 2018-02-10 ENCOUNTER — Encounter: Payer: Self-pay | Admitting: Gynecology

## 2018-02-10 ENCOUNTER — Ambulatory Visit (INDEPENDENT_AMBULATORY_CARE_PROVIDER_SITE_OTHER): Payer: Medicare Other | Admitting: Gynecology

## 2018-02-10 VITALS — BP 124/80 | Ht 62.0 in | Wt 164.0 lb

## 2018-02-10 DIAGNOSIS — Z1272 Encounter for screening for malignant neoplasm of vagina: Secondary | ICD-10-CM | POA: Diagnosis not present

## 2018-02-10 DIAGNOSIS — Z01419 Encounter for gynecological examination (general) (routine) without abnormal findings: Secondary | ICD-10-CM | POA: Diagnosis not present

## 2018-02-10 DIAGNOSIS — Z9189 Other specified personal risk factors, not elsewhere classified: Secondary | ICD-10-CM

## 2018-02-10 DIAGNOSIS — Z23 Encounter for immunization: Secondary | ICD-10-CM

## 2018-02-10 DIAGNOSIS — N952 Postmenopausal atrophic vaginitis: Secondary | ICD-10-CM

## 2018-02-10 DIAGNOSIS — Z87440 Personal history of urinary (tract) infections: Secondary | ICD-10-CM

## 2018-02-10 MED ORDER — CLOBETASOL PROPIONATE 0.05 % EX CREA: 1.0000 "application " | TOPICAL_CREAM | Freq: Two times a day (BID) | CUTANEOUS | 1 refills | Status: DC

## 2018-02-10 MED ORDER — CIPROFLOXACIN HCL 250 MG PO TABS: 250.0000 mg | ORAL_TABLET | Freq: Two times a day (BID) | ORAL | 0 refills | Status: DC

## 2018-02-10 NOTE — Progress Notes (Signed)
    Crystal Ramsey 1935/12/31 [id number]        82 y.o.  G2P1011 for for breast and pelvic exam.  Past medical history,surgical history, problem list, medications, allergies, family history and social history were all reviewed and documented as reviewed in the EPIC chart.  ROS:  Performed with pertinent positives and negatives included in the history, assessment and plan.   Additional significant findings : None   Exam: [name] assistant Vitals:   02/10/18 1535  BP: 124/80  Weight: 164 lb (74.4 kg)  Height: 5\' 2"  (1.575 m)   Body mass index is 30 kg/m.  General appearance:  Normal affect, orientation and appearance. Skin: Grossly normal HEENT: Without gross lesions.  No cervical or supraclavicular adenopathy. Thyroid normal.  Lungs:  Clear without wheezing, rales or rhonchi Cardiac: RR, without RMG Abdominal:  Soft, nontender, without masses, guarding, rebound, organomegaly or hernia Breasts:  Examined lying and sitting without masses, retractions, discharge or axillary adenopathy. Pelvic:  Ext, BUS, Vagina: With atrophic changes.  Pap smear of vaginal cuff done  Adnexa: Without masses or tenderness    Anus and perineum: Normal   Rectovaginal: Normal sphincter tone without palpated masses or tenderness.    Assessment/Plan:  82 y.o. G59P1011 female for breast and pelvic exam..   1. Postmenopausal/atrophic genital changes.  History of TVH in the past.  No significant menopausal symptoms. 2. History of UTI over the weekend several months ago.  She is very nervous that this will happen again and she will not have antibiotics available.  She took ciprofloxacin that she had at home and did well with this.  I discussed the risks of ciprofloxacin to include tendinitis/rupture.  She is done well with this in the past on short courses.  Ciprofloxacin 250 mg #10 provided to keep at home in the event she develops a UTI. 3. Occasional vulvitis for which she uses Temovate 0.05% cream.   Has run out and asked for refill.  30 g tube with 2 refills provided. 4. 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  Pap smear of vaginal cuff done today.  Continue with annual cytology per current screening guidelines. 5. Mammography 01/2018.  Continue with annual mammography next year.  Breast exam normal today. 6. Colonoscopy 10 years ago.  Will discuss colon screening with her primary physician. 7. DEXA 2015 normal.  Plan repeat DEXA next year at 5-year interval. 8. Health maintenance.  Discussed flu shot today.  Regular versus extra strength discussed.  Patient's I am sure that she will be able to get out to get the flu shot after today's visit and went ahead and receive the regular strength flu shot today.  No blood work done as patient does this at her primary physician's office.  Follow-up 1 year, sooner as needed.   Anastasio Auerbach MD, 4:05 PM 02/10/2018

## 2018-02-10 NOTE — Addendum Note (Signed)
Addended by: Nelva Nay on: 02/10/2018 04:34 PM   Modules accepted: Orders

## 2018-02-10 NOTE — Patient Instructions (Signed)
Follow-up in 1 year for annual exam, sooner as needed. 

## 2018-02-12 LAB — PAP IG W/ RFLX HPV ASCU

## 2018-03-23 DIAGNOSIS — L298 Other pruritus: Secondary | ICD-10-CM | POA: Diagnosis not present

## 2018-03-23 DIAGNOSIS — L433 Subacute (active) lichen planus: Secondary | ICD-10-CM | POA: Diagnosis not present

## 2018-03-23 DIAGNOSIS — L821 Other seborrheic keratosis: Secondary | ICD-10-CM | POA: Diagnosis not present

## 2018-03-23 DIAGNOSIS — D1801 Hemangioma of skin and subcutaneous tissue: Secondary | ICD-10-CM | POA: Diagnosis not present

## 2018-03-23 DIAGNOSIS — L570 Actinic keratosis: Secondary | ICD-10-CM | POA: Diagnosis not present

## 2018-03-23 DIAGNOSIS — D225 Melanocytic nevi of trunk: Secondary | ICD-10-CM | POA: Diagnosis not present

## 2018-03-31 DIAGNOSIS — Z79891 Long term (current) use of opiate analgesic: Secondary | ICD-10-CM | POA: Diagnosis not present

## 2018-03-31 DIAGNOSIS — M150 Primary generalized (osteo)arthritis: Secondary | ICD-10-CM | POA: Diagnosis not present

## 2018-03-31 DIAGNOSIS — G894 Chronic pain syndrome: Secondary | ICD-10-CM | POA: Diagnosis not present

## 2018-03-31 DIAGNOSIS — M47816 Spondylosis without myelopathy or radiculopathy, lumbar region: Secondary | ICD-10-CM | POA: Diagnosis not present

## 2018-04-13 DIAGNOSIS — H10412 Chronic giant papillary conjunctivitis, left eye: Secondary | ICD-10-CM | POA: Diagnosis not present

## 2018-04-14 DIAGNOSIS — H838X3 Other specified diseases of inner ear, bilateral: Secondary | ICD-10-CM | POA: Diagnosis not present

## 2018-04-14 DIAGNOSIS — H903 Sensorineural hearing loss, bilateral: Secondary | ICD-10-CM | POA: Diagnosis not present

## 2018-04-14 DIAGNOSIS — H6983 Other specified disorders of Eustachian tube, bilateral: Secondary | ICD-10-CM | POA: Diagnosis not present

## 2018-04-23 DIAGNOSIS — H10412 Chronic giant papillary conjunctivitis, left eye: Secondary | ICD-10-CM | POA: Diagnosis not present

## 2018-05-27 DIAGNOSIS — M47816 Spondylosis without myelopathy or radiculopathy, lumbar region: Secondary | ICD-10-CM | POA: Diagnosis not present

## 2018-05-27 DIAGNOSIS — M150 Primary generalized (osteo)arthritis: Secondary | ICD-10-CM | POA: Diagnosis not present

## 2018-05-27 DIAGNOSIS — Z79891 Long term (current) use of opiate analgesic: Secondary | ICD-10-CM | POA: Diagnosis not present

## 2018-05-27 DIAGNOSIS — G894 Chronic pain syndrome: Secondary | ICD-10-CM | POA: Diagnosis not present

## 2018-07-22 DIAGNOSIS — M47816 Spondylosis without myelopathy or radiculopathy, lumbar region: Secondary | ICD-10-CM | POA: Diagnosis not present

## 2018-07-22 DIAGNOSIS — G894 Chronic pain syndrome: Secondary | ICD-10-CM | POA: Diagnosis not present

## 2018-07-22 DIAGNOSIS — Z79891 Long term (current) use of opiate analgesic: Secondary | ICD-10-CM | POA: Diagnosis not present

## 2018-07-22 DIAGNOSIS — M150 Primary generalized (osteo)arthritis: Secondary | ICD-10-CM | POA: Diagnosis not present

## 2018-09-24 DIAGNOSIS — M150 Primary generalized (osteo)arthritis: Secondary | ICD-10-CM | POA: Diagnosis not present

## 2018-09-24 DIAGNOSIS — Z79891 Long term (current) use of opiate analgesic: Secondary | ICD-10-CM | POA: Diagnosis not present

## 2018-09-24 DIAGNOSIS — M47816 Spondylosis without myelopathy or radiculopathy, lumbar region: Secondary | ICD-10-CM | POA: Diagnosis not present

## 2018-09-24 DIAGNOSIS — G894 Chronic pain syndrome: Secondary | ICD-10-CM | POA: Diagnosis not present

## 2018-10-29 DIAGNOSIS — K219 Gastro-esophageal reflux disease without esophagitis: Secondary | ICD-10-CM | POA: Diagnosis not present

## 2018-10-29 DIAGNOSIS — M5136 Other intervertebral disc degeneration, lumbar region: Secondary | ICD-10-CM | POA: Diagnosis not present

## 2018-10-29 DIAGNOSIS — Z0000 Encounter for general adult medical examination without abnormal findings: Secondary | ICD-10-CM | POA: Diagnosis not present

## 2018-10-29 DIAGNOSIS — Z1389 Encounter for screening for other disorder: Secondary | ICD-10-CM | POA: Diagnosis not present

## 2018-10-29 DIAGNOSIS — I10 Essential (primary) hypertension: Secondary | ICD-10-CM | POA: Diagnosis not present

## 2018-10-29 DIAGNOSIS — M4696 Unspecified inflammatory spondylopathy, lumbar region: Secondary | ICD-10-CM | POA: Diagnosis not present

## 2018-11-19 DIAGNOSIS — G894 Chronic pain syndrome: Secondary | ICD-10-CM | POA: Diagnosis not present

## 2018-11-19 DIAGNOSIS — Z79891 Long term (current) use of opiate analgesic: Secondary | ICD-10-CM | POA: Diagnosis not present

## 2018-11-19 DIAGNOSIS — M150 Primary generalized (osteo)arthritis: Secondary | ICD-10-CM | POA: Diagnosis not present

## 2018-11-19 DIAGNOSIS — M47816 Spondylosis without myelopathy or radiculopathy, lumbar region: Secondary | ICD-10-CM | POA: Diagnosis not present

## 2019-01-21 DIAGNOSIS — M150 Primary generalized (osteo)arthritis: Secondary | ICD-10-CM | POA: Diagnosis not present

## 2019-01-21 DIAGNOSIS — G894 Chronic pain syndrome: Secondary | ICD-10-CM | POA: Diagnosis not present

## 2019-01-21 DIAGNOSIS — Z79891 Long term (current) use of opiate analgesic: Secondary | ICD-10-CM | POA: Diagnosis not present

## 2019-01-21 DIAGNOSIS — M47816 Spondylosis without myelopathy or radiculopathy, lumbar region: Secondary | ICD-10-CM | POA: Diagnosis not present

## 2019-02-03 DIAGNOSIS — H52203 Unspecified astigmatism, bilateral: Secondary | ICD-10-CM | POA: Diagnosis not present

## 2019-02-03 DIAGNOSIS — Z961 Presence of intraocular lens: Secondary | ICD-10-CM | POA: Diagnosis not present

## 2019-02-16 ENCOUNTER — Encounter: Payer: Medicare Other | Admitting: Gynecology

## 2019-02-18 ENCOUNTER — Encounter: Payer: Self-pay | Admitting: Gynecology

## 2019-02-23 ENCOUNTER — Encounter: Payer: Self-pay | Admitting: Gynecology

## 2019-02-23 DIAGNOSIS — Z803 Family history of malignant neoplasm of breast: Secondary | ICD-10-CM | POA: Diagnosis not present

## 2019-02-23 DIAGNOSIS — Z1231 Encounter for screening mammogram for malignant neoplasm of breast: Secondary | ICD-10-CM | POA: Diagnosis not present

## 2019-02-25 ENCOUNTER — Ambulatory Visit (INDEPENDENT_AMBULATORY_CARE_PROVIDER_SITE_OTHER): Payer: Medicare Other | Admitting: Gynecology

## 2019-02-25 ENCOUNTER — Other Ambulatory Visit: Payer: Self-pay

## 2019-02-25 ENCOUNTER — Encounter: Payer: Self-pay | Admitting: Gynecology

## 2019-02-25 VITALS — BP 134/84 | Ht 62.0 in | Wt 163.0 lb

## 2019-02-25 DIAGNOSIS — N763 Subacute and chronic vulvitis: Secondary | ICD-10-CM

## 2019-02-25 DIAGNOSIS — Z9189 Other specified personal risk factors, not elsewhere classified: Secondary | ICD-10-CM | POA: Diagnosis not present

## 2019-02-25 DIAGNOSIS — Z01419 Encounter for gynecological examination (general) (routine) without abnormal findings: Secondary | ICD-10-CM

## 2019-02-25 DIAGNOSIS — N390 Urinary tract infection, site not specified: Secondary | ICD-10-CM

## 2019-02-25 DIAGNOSIS — Z1272 Encounter for screening for malignant neoplasm of vagina: Secondary | ICD-10-CM | POA: Diagnosis not present

## 2019-02-25 DIAGNOSIS — N952 Postmenopausal atrophic vaginitis: Secondary | ICD-10-CM | POA: Diagnosis not present

## 2019-02-25 MED ORDER — CIPROFLOXACIN HCL 250 MG PO TABS
250.0000 mg | ORAL_TABLET | Freq: Two times a day (BID) | ORAL | 0 refills | Status: DC
Start: 2019-02-25 — End: 2019-07-20

## 2019-02-25 MED ORDER — CLOBETASOL PROPIONATE 0.05 % EX CREA: 1.0000 "application " | TOPICAL_CREAM | Freq: Two times a day (BID) | CUTANEOUS | 1 refills | Status: DC

## 2019-02-25 NOTE — Patient Instructions (Signed)
Follow-up in 1 year for annual exam, sooner as needed. 

## 2019-02-25 NOTE — Progress Notes (Signed)
    Crystal Ramsey 1936-05-03 [id number]        83 y.o.  G2P1011 for breast and pelvic exam.  Without gynecologic complaints  Past medical history,surgical history, problem list, medications, allergies, family history and social history were all reviewed and documented as reviewed in the EPIC chart.  ROS:  Performed with pertinent positives and negatives included in the history, assessment and plan.   Additional significant findings : None   Exam: Crystal Ramsey assistant Vitals:   02/25/19 1510  BP: 134/84  Weight: 163 lb (73.9 kg)  Height: 5\' 2"  (1.575 m)   Body mass index is 29.81 kg/m.  General appearance:  Normal affect, orientation and appearance. Skin: Grossly normal HEENT: Without gross lesions.  No cervical or supraclavicular adenopathy. Thyroid normal.  Lungs:  Clear without wheezing, rales or rhonchi Cardiac: RR, without RMG Abdominal:  Soft, nontender, without masses, guarding, rebound, organomegaly or hernia Breasts:  Examined lying and sitting without masses, retractions, discharge or axillary adenopathy. Pelvic:  Ext, BUS, Vagina: With atrophic changes.  Pap smear of vaginal cuff done  Adnexa: Without masses or tenderness    Anus and perineum: Normal   Rectovaginal: Normal sphincter tone without palpated masses or tenderness.    Assessment/Plan:  83 y.o. G50P1011 female for breast and pelvic exam  1. Postmenopausal.  Status post TVH in the past.  Without significant menopausal symptoms. 2. History of chronic intermittent vulvitis.  Uses Temovate 0.05% cream intermittently with good results.  30 g tube with 1 refill provided. 3. 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  Pap smear of vaginal cuff done today.  Continue with annual cytology.  No history of significant abnormal Pap smears previously. 4. Mammography 02/2019.  Continue with annual mammography next year.  Breast exam normal today. 5. Colonoscopy 10 years ago.  Has discussed colon screening with her primary physician.   She is unsure whether she wants to proceed with any screening to include Cologuard or stop screening altogether.  She will continue to discuss with her primary provider. 6. History of occasional UTI.  Likes to keep a prescription for ciprofloxacin in the event that she has a UTI.  She is allergic to a number of antibiotics to include penicillin Septra Macrobid.  Ciprofloxacin 250 mg #14 provided.  We again discussed the black box warning and I read it to her she understands and accepts. 7. Health maintenance.  No routine lab work done as patient does this elsewhere.  Follow-up 1 year, sooner as needed.   Anastasio Auerbach MD, 3:40 PM 02/25/2019

## 2019-02-26 DIAGNOSIS — Z23 Encounter for immunization: Secondary | ICD-10-CM | POA: Diagnosis not present

## 2019-02-27 LAB — PAP IG W/ RFLX HPV ASCU

## 2019-03-18 DIAGNOSIS — M25562 Pain in left knee: Secondary | ICD-10-CM | POA: Diagnosis not present

## 2019-03-18 DIAGNOSIS — Z96651 Presence of right artificial knee joint: Secondary | ICD-10-CM | POA: Diagnosis not present

## 2019-03-23 DIAGNOSIS — Z79891 Long term (current) use of opiate analgesic: Secondary | ICD-10-CM | POA: Diagnosis not present

## 2019-03-23 DIAGNOSIS — M47816 Spondylosis without myelopathy or radiculopathy, lumbar region: Secondary | ICD-10-CM | POA: Diagnosis not present

## 2019-03-23 DIAGNOSIS — G894 Chronic pain syndrome: Secondary | ICD-10-CM | POA: Diagnosis not present

## 2019-03-23 DIAGNOSIS — M150 Primary generalized (osteo)arthritis: Secondary | ICD-10-CM | POA: Diagnosis not present

## 2019-03-26 DIAGNOSIS — M25562 Pain in left knee: Secondary | ICD-10-CM | POA: Diagnosis not present

## 2019-03-26 DIAGNOSIS — M1712 Unilateral primary osteoarthritis, left knee: Secondary | ICD-10-CM | POA: Diagnosis not present

## 2019-04-22 DIAGNOSIS — I10 Essential (primary) hypertension: Secondary | ICD-10-CM | POA: Diagnosis not present

## 2019-04-22 DIAGNOSIS — Z01818 Encounter for other preprocedural examination: Secondary | ICD-10-CM | POA: Diagnosis not present

## 2019-04-22 DIAGNOSIS — M1712 Unilateral primary osteoarthritis, left knee: Secondary | ICD-10-CM | POA: Diagnosis not present

## 2019-04-22 DIAGNOSIS — K219 Gastro-esophageal reflux disease without esophagitis: Secondary | ICD-10-CM | POA: Diagnosis not present

## 2019-06-07 ENCOUNTER — Inpatient Hospital Stay: Admit: 2019-06-07 | Payer: PRIVATE HEALTH INSURANCE | Admitting: Orthopedic Surgery

## 2019-06-07 SURGERY — ARTHROPLASTY, KNEE, TOTAL
Anesthesia: Choice | Site: Knee | Laterality: Left

## 2019-06-21 ENCOUNTER — Ambulatory Visit: Payer: PRIVATE HEALTH INSURANCE

## 2019-06-22 DIAGNOSIS — H61002 Unspecified perichondritis of left external ear: Secondary | ICD-10-CM | POA: Diagnosis not present

## 2019-06-22 DIAGNOSIS — L853 Xerosis cutis: Secondary | ICD-10-CM | POA: Diagnosis not present

## 2019-06-22 DIAGNOSIS — L661 Lichen planopilaris: Secondary | ICD-10-CM | POA: Diagnosis not present

## 2019-06-22 DIAGNOSIS — L298 Other pruritus: Secondary | ICD-10-CM | POA: Diagnosis not present

## 2019-06-22 DIAGNOSIS — L821 Other seborrheic keratosis: Secondary | ICD-10-CM | POA: Diagnosis not present

## 2019-06-22 DIAGNOSIS — L245 Irritant contact dermatitis due to other chemical products: Secondary | ICD-10-CM | POA: Diagnosis not present

## 2019-06-22 DIAGNOSIS — D225 Melanocytic nevi of trunk: Secondary | ICD-10-CM | POA: Diagnosis not present

## 2019-06-22 DIAGNOSIS — L603 Nail dystrophy: Secondary | ICD-10-CM | POA: Diagnosis not present

## 2019-07-06 NOTE — H&P (Signed)
TOTAL KNEE ADMISSION H&P  Patient is being admitted for left total knee arthroplasty.  Subjective:  Chief Complaint: left knee pain.  HPI: Crystal Ramsey, 84 y.o. female has a history of pain and functional disability in the left knee due to arthritis and has failed non-surgical conservative treatments for greater than 12 weeks to include corticosteriod injections and activity modification. Onset of symptoms was gradual, starting several years ago with gradually worsening course since that time. The patient noted no past surgery on the left knee.  Patient currently rates pain in the left knee at 4 out of 10 with activity. Patient has worsening of pain with activity and weight bearing, pain that interferes with activities of daily living, joint swelling and instability. Patient has evidence of tri-compartmental bone-on-bone changes by imaging studies. There is no active infection.  Patient Active Problem List   Diagnosis Date Noted  . OA (osteoarthritis) of hip 09/07/2014  . Postop Hyponatremia 05/26/2012  . Postop Hypokalemia 05/26/2012  . OA (osteoarthritis) of knee 05/25/2012  . Hypertension   . Lichen plano-pilaris   . Arthritis     Past Medical History:  Diagnosis Date  . Arthritis   . Back pain   . Complication of anesthesia   . Constipation, chronic   . DES exposure in utero   . Family history of adverse reaction to anesthesia    VOMITING  . Fibromyalgia    PT THINKS SHE HAS FIBROMYALGIA  . GERD (gastroesophageal reflux disease)   . 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 Hypertension   . Lichen plano-pilaris   . PONV (postoperative nausea and vomiting)     Past Surgical History:  Procedure Laterality Date  . ABDOMINAL SURGERY     Laparotomy  . BREAST SURGERY     Biopsy-benign  . CATARACT EXTRACTION    . OOPHORECTOMY     LSO  . PELVIC LAPAROSCOPY     DL  . TOTAL HIP ARTHROPLASTY Right 09/07/2014   Procedure: RIGHT  TOTAL HIP ARTHROPLASTY ANTERIOR APPROACH;  Surgeon: Gaynelle Arabian, MD;  Location: [facility];  Service: Orthopedics;  Laterality: Right;  . TOTAL HIP ARTHROPLASTY Left 12/13/2015   Procedure: LEFT TOTAL HIP ARTHROPLASTY ANTERIOR APPROACH;  Surgeon: Gaynelle Arabian, MD;  Location: [facility];  Service: Orthopedics;  Laterality: Left;  . TOTAL KNEE ARTHROPLASTY  05/25/2012   Procedure: TOTAL KNEE ARTHROPLASTY;  Surgeon: Gearlean Alf, MD;  Location: [facility];  Service: Orthopedics;  Laterality: Right;  [name] VAGINAL HYSTERECTOMY    . varicose vein ligation      Prior to Admission medications   Medication Sig Start Date End Date Taking? Authorizing Provider  amiloride-hydrochlorothiazide (MODURETIC) 5-50 MG tablet Take 0.75 tablets by mouth daily. Takes 3/4  tablet   Yes [provider]  calcium carbonate (TUMS - DOSED IN MG ELEMENTAL CALCIUM) 500 MG chewable tablet Chew 2 tablets by mouth 3 (three) times daily as needed for indigestion or heartburn.   Yes [provider]  HYDROcodone-acetaminophen (NORCO) 7.5-325 MG tablet Take 1-2 tablets by mouth every 4 (four) hours as needed for moderate pain. 12/15/15  Yes Perkins, Alexzandrew L, PA-C  Multiple Vitamin (MULTIVITAMIN WITH MINERALS) TABS tablet Take 1 tablet by mouth daily.   Yes [provider]  omeprazole (PRILOSEC OTC) 20 MG tablet Take 20 mg by mouth daily as needed (acid reflux).    Yes [provider]  potassium chloride (K-DUR) 10 MEQ tablet Take  10 mEq by mouth 2 (two) times daily.    Yes [provider]  ciprofloxacin (CIPRO) 250 MG tablet Take 1 tablet (250 mg total) by mouth 2 (two) times daily. For 7 days Patient not taking: Reported on 07/05/2019 02/25/19   Fontaine, Belinda Block, MD  clobetasol cream (TEMOVATE) [id number] % Apply 1 application topically 2 (two) times daily. Patient not taking: Reported on 07/05/2019 02/25/19   Fontaine, Belinda Block, MD    Allergies  Allergen Reactions  . Other Other (See Comments)     CONTRAST DYE-unknown  . Demerol [Meperidine] Itching and Nausea And Vomiting  . Dilaudid [Hydromorphone Hcl] Itching and Nausea And Vomiting  . Macrodantin [Nitrofurantoin] Other (See Comments)    unknown  . Sulfa Antibiotics Other (See Comments)    unknown  . Zithromax [Azithromycin] Other (See Comments)    flu  . Chlorhexidine Gluconate Rash  . Penicillins Other (See Comments)    Has patient had a PCN reaction causing immediate rash, facial/tongue/throat swelling, SOB or lightheadedness with hypotension: NO Has patient had a PCN reaction causing severe rash involving mucus membranes or skin necrosis: unknown Has patient had a PCN reaction that required hospitalization: unknown Has patient had a PCN reaction occurring within the last 10 years: No If all of the above answers are "NO", then may proceed with Cephalosporin use.  Unknown  Think it was vomiting, NO anaphylaxis    Social History   Socioeconomic History  . Marital status: Married    Spouse name: Not on file  . Number of children: Not on file  . Years of education: Not on file  . Highest education level: Not on file  Occupational History  . Not on file  Tobacco Use  . Smoking status: Former Smoker    Types: Cigarettes    Quit date: 05/16/1983    Years since quitting: 36.1  . Smokeless tobacco: Never Used  Substance and Sexual Activity  . Alcohol use: Yes    Alcohol/week: 7.0 standard drinks    Types: 7 Standard drinks or equivalent per week    Comment:  WINE PER NIGHT  . Drug use: No  . Sexual activity: Never    Birth control/protection: Surgical    Comment: 1st intercourse 84 yo-Fewer than 5 partners  Other Topics Concern  . Not on file  Social History Narrative  . Not on file   Social Determinants of Health   Financial Resource Strain:   . Difficulty of Paying Living Expenses: Not on file  Food Insecurity:   . Worried About Charity fundraiser in the Last Year: Not on file  . Ran Out of Food in  the Last Year: Not on file  Transportation Needs:   . Lack of Transportation (Medical): Not on file  . Lack of Transportation (Non-Medical): Not on file  Physical Activity:   . Days of Exercise per Week: Not on file  . Minutes of Exercise per Session: Not on file  Stress:   . Feeling of Stress : Not on file  Social Connections:   . Frequency of Communication with Friends and Family: Not on file  . Frequency of Social Gatherings with Friends and Family: Not on file  . Attends Religious Services: Not on file  . Active Member of Clubs or Organizations: Not on file  . Attends Archivist Meetings: Not on file  . Marital Status: Not on file  Intimate Partner Violence:   . Fear of Current or Ex-Partner: Not on  file  . Emotionally Abused: Not on file  . Physically Abused: Not on file  . Sexually Abused: Not on file      Tobacco Use: Medium Risk  . Smoking Tobacco Use: Former Smoker  . Smokeless Tobacco Use: Never Used   Social History   Substance and Sexual Activity  Alcohol Use Yes  . Alcohol/week: 7.0 standard drinks  . Types: 7 Standard drinks or equivalent per week   Comment:  WINE PER NIGHT    Family History  Problem Relation Age of Onset  . Uterine cancer Mother   . Hypertension Father   . Lung cancer Brother   . Hypertension Sister   . Breast cancer Sister        Age 43  . Breast cancer Paternal Aunt        Age 71  . Stomach cancer Maternal Uncle   . Lung cancer Paternal Aunt   . Breast cancer Daughter 3    Review of Systems  Constitutional: Negative for chills and fever.  HENT: Negative for congestion, sore throat and tinnitus.   Eyes: Negative for double vision, photophobia and pain.  Respiratory: Negative for cough, shortness of breath and wheezing.   Cardiovascular: Negative for chest pain, palpitations and orthopnea.  Gastrointestinal: Negative for heartburn, nausea and vomiting.  Genitourinary: Negative for dysuria, frequency and urgency.    Musculoskeletal: Positive for joint pain.  Neurological: Negative for dizziness, weakness and headaches.    Objective:  Physical Exam: Well nourished and well developed.  General: Alert and oriented x3, cooperative and pleasant, no acute distress.  Head: normocephalic, atraumatic, neck supple.  Eyes: EOMI.  Respiratory: breath sounds clear in all fields, no wheezing, rales, or rhonchi. Cardiovascular: Regular rate and rhythm, no murmurs, gallops or rubs.  Abdomen: non-tender to palpation and soft, normoactive bowel sounds. Musculoskeletal:  Left Knee Exam:  No effusion present.  The range of motion is: 5 to 125 degrees.  Marked crepitus on range of motion of the knee.  Positive medial joint line tenderness. No lateral joint line tenderness.  The knee is stable.  Calves soft and nontender. Motor function intact in LE. Strength 5/5 LE bilaterally. Neuro: Distal pulses 2+. Sensation to light touch intact in LE.  Vital signs in last 24 hours: Blood pressure: 154/82 mmHg Pulse: 72 bpm  Imaging Review Plain radiographs demonstrate severe degenerative joint disease of the left knee. The overall alignment is neutral. The bone quality appears to be adequate for age and reported activity level.  Assessment/Plan:  End stage arthritis, left knee   The patient history, physical examination, clinical judgment of the provider and imaging studies are consistent with end stage degenerative joint disease of the left knee and total knee arthroplasty is deemed medically necessary. The treatment options including medical management, injection therapy arthroscopy and arthroplasty were discussed at length. The risks and benefits of total knee arthroplasty were presented and reviewed. The risks due to aseptic loosening, infection, stiffness, patella tracking problems, thromboembolic complications and other imponderables were discussed. The patient acknowledged the explanation, agreed to proceed with  the plan and consent was signed. Patient is being admitted for inpatient treatment for surgery, pain control, PT, OT, prophylactic antibiotics, VTE prophylaxis, progressive ambulation and ADLs and discharge planning. The patient is planning to be discharged home.  Anticipated LOS equal to or greater than 2 midnights due to - Age 76 and older with one or more of the following:  - Obesity  - Expected need for hospital services (  PT, OT, Nursing) required for safe  discharge  - Anticipated need for postoperative skilled nursing care or inpatient rehab  - Active co-morbidities: None OR   - Unanticipated findings during/Post Surgery: None  - Patient is a high risk of re-admission due to: None  Therapy Plans: Outpatient therapy at [facility] Disposition: Home with husband and daughter Planned DVT Prophylaxis: Aspirin 325 mg BID DME Needed: None PCP: Lavone Orn, MD TXA: IV Allergies: Chlorahexidine (rash), dilaudid (vomiting), PCN (vomiting), sulfa Anesthesia Concerns: None BMI: 30.4 Other: Currently taking Norco 7.5-325 mg TID for knee pain (prescribed through pain management physician)  - Patient was instructed on what medications to stop prior to surgery. - Follow-up visit in 2 weeks with Dr. Wynelle Link - Begin physical therapy following surgery - Pre-operative lab work as pre-surgical testing - Prescriptions will be provided in hospital at time of discharge  Theresa Duty, PA-C Orthopedic Surgery [facility] [place]

## 2019-07-09 NOTE — Patient Instructions (Signed)
DUE TO COVID-19 ONLY ONE VISITOR IS ALLOWED TO COME WITH YOU AND STAY IN THE WAITING ROOM ONLY DURING PRE OP AND PROCEDURE DAY OF SURGERY. THE 1 VISITOR MAY VISIT WITH YOU AFTER SURGERY IN YOUR PRIVATE ROOM DURING VISITING HOURS ONLY!  YOU NEED TO HAVE A COVID 19 TEST ON_3/4/21______ @__10 :30_____, THIS TEST MUST BE DONE BEFORE SURGERY, COME  Crystal Ramsey , [id number].  ([place]) ONCE YOUR COVID TEST IS COMPLETED, PLEASE BEGIN THE QUARANTINE INSTRUCTIONS AS OUTLINED IN YOUR HANDOUT.                Crystal Ramsey    Your procedure is scheduled on:07/19/19    Report to [facility] Main  Entrance   Report to admitting at    8:00 AM     Call this number if you have problems the morning of surgery [place], NO CHEWING GUM [place].  Do not eat food After Midnight  . YOU MAY HAVE CLEAR LIQUIDS FROM MIDNIGHT UNTIL 7:30AM.    CLEAR LIQUID DIET   Foods Allowed                                                                     Foods Excluded  Coffee and tea, regular and decaf                             liquids that you cannot  Plain Jell-O any favor except red or purple                                           see through such as: Fruit ices (not with fruit pulp)                                     milk, soups, orange juice  Iced Popsicles                                    All solid food Carbonated beverages, regular and diet                                    Cranberry, grape and apple juices Sports drinks like Gatorade Lightly seasoned clear broth or consume(fat free) Sugar, honey syrup   _____________________________________________________________________    At 7:30AM Please finish the prescribed Pre-Surgery Gatorade drink. Nothing by mouth after you finish the Gatorade drink !    Take these medicines the morning of surgery with A SIP OF WATER:  Omeprizole                                 You may not have any metal on your body including hair pins and  piercings  Do not wear jewelry, make-up, lotions, powders or perfumes, deodorant             Do not wear nail polish on your fingernails.  Do not shave  48 hours prior to surgery.             Do not bring valuables to the hospital. [place].  Contacts, dentures or bridgework may not be worn into surgery.                 Please read over the following fact sheets you were given: _____________________________________________________________________             [facility] - Preparing for Surgery  Before surgery, you can play an important role.   Because skin is not sterile, your skin needs to be as free of germs as possible.  You can reduce the number of germs on your skin by washing with CHG (chlorahexidine gluconate) soap before surgery.   CHG is an antiseptic cleaner which kills germs and bonds with the skin to continue killing germs even after washing. Please DO NOT use if you have an allergy to CHG or antibacterial soaps.   If your skin becomes reddened/irritated stop using the CHG and inform your nurse when you arrive at Short Stay. Do not shave (including legs and underarms) for at least 48 hours prior to the first CHG shower.    Please follow these instructions carefully:  1.  Shower with CHG Soap the night before surgery and the  morning of Surgery.  2.  If you choose to wash your hair, wash your hair first as usual with your  normal  shampoo.  3.  After you shampoo, rinse your hair and body thoroughly to remove the  shampoo.                                        4.  Use CHG as you would any other liquid soap.  You can apply chg directly  to the skin and wash                       Gently with a scrungie or clean washcloth.  5.  Apply the CHG Soap to your body ONLY FROM THE NECK DOWN.   Do not use on face/  open                           Wound or open sores. Avoid contact with eyes, ears mouth and genitals (private parts).                       Wash face,  Genitals (private parts) with your normal soap.             6.  Wash thoroughly, paying special attention to the area where your surgery  will be performed.  7.  Thoroughly rinse your body with warm water from the neck down.  8.  DO NOT shower/wash with your normal soap after using and rinsing off  the CHG Soap.             9.  Pat yourself dry with a clean towel.  10.  Wear clean pajamas.            11.  Place clean sheets on your bed the night of your first shower and do not  sleep with pets. Day of Surgery : Do not apply any lotions/deodorants the morning of surgery.  Please wear clean clothes to the hospital/surgery center.  FAILURE TO FOLLOW THESE INSTRUCTIONS MAY RESULT IN THE CANCELLATION OF YOUR SURGERY PATIENT SIGNATURE_________________________________  NURSE SIGNATURE__________________________________  ________________________________________________________________________   Crystal Ramsey  An incentive spirometer is a tool that can help keep your lungs clear and active. This tool measures how well you are filling your lungs with each breath. Taking long deep breaths may help reverse or decrease the chance of developing breathing (pulmonary) problems (especially infection) following:  A long period of time when you are unable to move or be active. BEFORE THE PROCEDURE   If the spirometer includes an indicator to show your best effort, your nurse or respiratory therapist will set it to a desired goal.  If possible, sit up straight or lean slightly forward. Try not to slouch.  Hold the incentive spirometer in an upright position. INSTRUCTIONS FOR USE  1. Sit on the edge of your bed if possible, or sit up as far as you can in bed or on a chair. 2. Hold the incentive spirometer in an upright  position. 3. Breathe out normally. 4. Place the mouthpiece in your mouth and seal your lips tightly around it. 5. Breathe in slowly and as deeply as possible, raising the piston or the ball toward the top of the column. 6. Hold your breath for 3-5 seconds or for as long as possible. Allow the piston or ball to fall to the bottom of the column. 7. Remove the mouthpiece from your mouth and breathe out normally. 8. Rest for a few seconds and repeat Steps 1 through 7 at least 10 times every 1-2 hours when you are awake. Take your time and take a few normal breaths between deep breaths. 9. The spirometer may include an indicator to show your best effort. Use the indicator as a goal to work toward during each repetition. 10. After each set of 10 deep breaths, practice coughing to be sure your lungs are clear. If you have an incision (the cut made at the time of surgery), support your incision when coughing by placing a pillow or rolled up towels firmly against it. Once you are able to get out of bed, walk around indoors and cough well. You may stop using the incentive spirometer when instructed by your caregiver.  RISKS AND COMPLICATIONS  Take your time so you do not get dizzy or light-headed.  If you are in pain, you may need to take or ask for pain medication before doing incentive spirometry. It is harder to take a deep breath if you are having pain. AFTER USE  Rest and breathe slowly and easily.  It can be helpful to keep track of a log of your progress. Your caregiver can provide you with a simple table to help with this. If you are using the spirometer at home, follow these instructions: [place] IF:   You are having difficultly using the spirometer.  You have trouble using the spirometer as often as instructed.  Your pain medication is not giving enough relief while using the spirometer.  You develop fever of 100.5 F (38.1 C) or higher. SEEK IMMEDIATE MEDICAL CARE IF:    You cough up bloody  sputum that had not been present before.  You develop fever of 102 F (38.9 C) or greater.  You develop worsening pain at or near the incision site. MAKE SURE YOU:   Understand these instructions.  Will watch your condition.  Will get help right away if you are not doing well or get worse. Document Released: 09/09/2006 Document Revised: 07/22/2011 Document Reviewed: 11/10/2006 ExitCare Patient Information 2014 ExitCare, [place].   ________________________________________________________________________  WHAT IS A BLOOD TRANSFUSION? Blood Transfusion Information  A transfusion is the replacement of blood or some of its parts. Blood is made up of multiple cells which provide different functions.  Red blood cells carry oxygen and are used for blood loss replacement.  White blood cells fight against infection.  Platelets control bleeding.  Plasma helps clot blood.  Other blood products are available for specialized needs, such as hemophilia or other clotting disorders. BEFORE THE TRANSFUSION  Who gives blood for transfusions?   Healthy volunteers who are fully evaluated to make sure their blood is safe. This is blood bank blood. Transfusion therapy is the safest it has ever been in the practice of medicine. Before blood is taken from a donor, a complete history is taken to make sure that person has no history of diseases nor engages in risky social behavior (examples are intravenous drug use or sexual activity with multiple partners). The donor's travel history is screened to minimize risk of transmitting infections, such as malaria. The donated blood is tested for signs of infectious diseases, such as HIV and hepatitis. The blood is then tested to be sure it is compatible with you in order to minimize the chance of a transfusion reaction. If you or a relative donates blood, this is often done in anticipation of surgery and is not appropriate for emergency  situations. It takes many days to process the donated blood. RISKS AND COMPLICATIONS Although transfusion therapy is very safe and saves many lives, the main dangers of transfusion include:   Getting an infectious disease.  Developing a transfusion reaction. This is an allergic reaction to something in the blood you were given. Every precaution is taken to prevent this. The decision to have a blood transfusion has been considered carefully by your caregiver before blood is given. Blood is not given unless the benefits outweigh the risks. AFTER THE TRANSFUSION  Right after receiving a blood transfusion, you will usually feel much better and more energetic. This is especially true if your red blood cells have gotten low (anemic). The transfusion raises the level of the red blood cells which carry oxygen, and this usually causes an energy increase.  The nurse administering the transfusion will monitor you carefully for complications. HOME CARE INSTRUCTIONS  No special instructions are needed after a transfusion. You may find your energy is better. Speak with your caregiver about any limitations on activity for underlying diseases you may have. SEEK MEDICAL CARE IF:   Your condition is not improving after your transfusion.  You develop redness or irritation at the intravenous (IV) site. SEEK IMMEDIATE MEDICAL CARE IF:  Any of the following symptoms occur over the next 12 hours:  Shaking chills.  You have a temperature by mouth above 102 F (38.9 C), not controlled by medicine.  Chest, back, or muscle pain.  People around you feel you are not acting correctly or are confused.  Shortness of breath or difficulty breathing.  Dizziness and fainting.  You get a rash or develop hives.  You have a decrease  in urine output.  Your urine turns a dark color or changes to pink, red, or brown. Any of the following symptoms occur over the next 10 days:  You have a temperature by mouth above  102 F (38.9 C), not controlled by medicine.  Shortness of breath.  Weakness after normal activity.  The white part of the eye turns yellow (jaundice).  You have a decrease in the amount of urine or are urinating less often.  Your urine turns a dark color or changes to pink, red, or brown. Document Released: 04/26/2000 Document Revised: 07/22/2011 Document Reviewed: 12/14/2007 [facility] Patient Information 2014 [place], [place].  _______________________________________________________________________

## 2019-07-10 ENCOUNTER — Ambulatory Visit: Payer: PRIVATE HEALTH INSURANCE

## 2019-07-12 ENCOUNTER — Encounter (HOSPITAL_COMMUNITY)
Admission: RE | Admit: 2019-07-12 | Discharge: 2019-07-12 | Disposition: A | Discharge status: Home / Self Care | Payer: Medicare Other | Source: Ambulatory Visit | Attending: Orthopedic Surgery | Admitting: Orthopedic Surgery

## 2019-07-12 ENCOUNTER — Other Ambulatory Visit: Payer: Self-pay

## 2019-07-12 ENCOUNTER — Encounter (HOSPITAL_COMMUNITY): Payer: Self-pay

## 2019-07-12 DIAGNOSIS — I10 Essential (primary) hypertension: Secondary | ICD-10-CM | POA: Diagnosis not present

## 2019-07-12 DIAGNOSIS — Z01812 Encounter for preprocedural laboratory examination: Secondary | ICD-10-CM | POA: Insufficient documentation

## 2019-07-12 DIAGNOSIS — Z01810 Encounter for preprocedural cardiovascular examination: Secondary | ICD-10-CM | POA: Insufficient documentation

## 2019-07-12 LAB — CBC
HCT: 36.7 % (ref 36.0–46.0)
Hemoglobin: 12.3 g/dL (ref 12.0–15.0)
MCH: 32.3 pg (ref 26.0–34.0)
MCHC: 33.5 g/dL (ref 30.0–36.0)
MCV: 96.3 fL (ref 80.0–100.0)
Platelets: 219 10*3/uL (ref 150–400)
RBC: 3.81 MIL/uL — ABNORMAL LOW (ref 3.87–5.11)
RDW: 13.1 % (ref 11.5–15.5)
WBC: 3.6 10*3/uL — ABNORMAL LOW (ref 4.0–10.5)
nRBC: 0 % (ref 0.0–0.2)

## 2019-07-12 LAB — SURGICAL PCR SCREEN
MRSA, PCR: NEGATIVE
Staphylococcus aureus: NEGATIVE

## 2019-07-12 LAB — COMPREHENSIVE METABOLIC PANEL
ALT: 21 U/L (ref 0–44)
AST: 23 U/L (ref 15–41)
Albumin: 3.8 g/dL (ref 3.5–5.0)
Alkaline Phosphatase: 70 U/L (ref 38–126)
Anion gap: 9 (ref 5–15)
BUN: 20 mg/dL (ref 8–23)
CO2: 29 mmol/L (ref 22–32)
Calcium: 9.1 mg/dL (ref 8.9–10.3)
Chloride: 95 mmol/L — ABNORMAL LOW (ref 98–111)
Creatinine, Ser: 0.76 mg/dL (ref 0.44–1.00)
GFR calc Af Amer: >60 mL/min (ref >60)
GFR calc non Af Amer: >60 mL/min (ref >60)
Glucose, Bld: 93 mg/dL (ref 70–99)
Potassium: 3.1 mmol/L — ABNORMAL LOW (ref 3.5–5.1)
Sodium: 133 mmol/L — ABNORMAL LOW (ref 135–145)
Total Bilirubin: 0.4 mg/dL (ref 0.3–1.2)
Total Protein: 7.1 g/dL (ref 6.5–8.1)

## 2019-07-12 LAB — APTT: aPTT: 35 seconds (ref 24–36)

## 2019-07-12 LAB — PROTIME-INR
INR: 1 (ref 0.8–1.2)
Prothrombin Time: 12.7 seconds (ref 11.4–15.2)

## 2019-07-12 NOTE — Progress Notes (Signed)
PCP - Dr. Electa Sniff Cardiologist -  none  Chest x-ray - 2014 EKG - 07/12/19 Stress Test - no ECHO - no Cardiac Cath - no  Sleep Study - no CPAP -   Fasting Blood Sugar - NA Checks Blood Sugar _____ times a day  Blood Thinner Instructions: Na Aspirin Instructions: Last Dose:  Anesthesia review:   Patient denies shortness of breath, fever, cough and chest pain at PAT appointment yes Patient verbalized understanding of instructions that were given to them at the PAT appointment. Patient was also instructed that they will need to review over the PAT instructions again at home before surgery. yes

## 2019-07-13 LAB — ABO/RH: ABO/RH(D): O POS

## 2019-07-15 ENCOUNTER — Other Ambulatory Visit (HOSPITAL_COMMUNITY)
Admission: RE | Admit: 2019-07-15 | Discharge: 2019-07-15 | Disposition: A | Discharge status: Home / Self Care | Payer: Medicare Other | Source: Ambulatory Visit | Attending: Orthopedic Surgery | Admitting: Orthopedic Surgery

## 2019-07-15 DIAGNOSIS — Z01812 Encounter for preprocedural laboratory examination: Secondary | ICD-10-CM | POA: Insufficient documentation

## 2019-07-15 DIAGNOSIS — Z20822 Contact with and (suspected) exposure to covid-19: Secondary | ICD-10-CM | POA: Diagnosis not present

## 2019-07-15 LAB — SARS CORONAVIRUS 2 (TAT 6-24 HRS): SARS Coronavirus 2: NEGATIVE

## 2019-07-18 MED ORDER — BUPIVACAINE LIPOSOME 1.3 % IJ SUSP
20.0000 mL | Freq: Once | INTRAMUSCULAR | Status: DC
  Filled 2019-07-18: qty 20

## 2019-07-19 ENCOUNTER — Other Ambulatory Visit: Payer: Self-pay

## 2019-07-19 ENCOUNTER — Encounter (HOSPITAL_COMMUNITY)
Admission: RE | Disposition: A | Discharge status: Home / Self Care | Payer: Self-pay | Source: Other Acute Inpatient Hospital | Attending: Orthopedic Surgery

## 2019-07-19 ENCOUNTER — Encounter (HOSPITAL_COMMUNITY): Payer: Self-pay | Admitting: Orthopedic Surgery

## 2019-07-19 ENCOUNTER — Observation Stay (HOSPITAL_COMMUNITY)
Admission: RE | Admit: 2019-07-19 | Discharge: 2019-07-20 | Disposition: A | Discharge status: Home / Self Care | Payer: Medicare Other | Source: Other Acute Inpatient Hospital | Attending: Orthopedic Surgery | Admitting: Orthopedic Surgery

## 2019-07-19 ENCOUNTER — Inpatient Hospital Stay (HOSPITAL_COMMUNITY): Payer: Medicare Other | Admitting: Certified Registered Nurse Anesthetist

## 2019-07-19 ENCOUNTER — Inpatient Hospital Stay (HOSPITAL_COMMUNITY): Payer: Medicare Other | Admitting: Physician Assistant

## 2019-07-19 DIAGNOSIS — M179 Osteoarthritis of knee, unspecified: Secondary | ICD-10-CM | POA: Diagnosis present

## 2019-07-19 DIAGNOSIS — M1712 Unilateral primary osteoarthritis, left knee: Principal | ICD-10-CM | POA: Diagnosis present

## 2019-07-19 DIAGNOSIS — G8918 Other acute postprocedural pain: Secondary | ICD-10-CM | POA: Diagnosis not present

## 2019-07-19 DIAGNOSIS — Z79899 Other long term (current) drug therapy: Secondary | ICD-10-CM | POA: Insufficient documentation

## 2019-07-19 DIAGNOSIS — K219 Gastro-esophageal reflux disease without esophagitis: Secondary | ICD-10-CM | POA: Diagnosis not present

## 2019-07-19 DIAGNOSIS — M797 Fibromyalgia: Secondary | ICD-10-CM | POA: Diagnosis not present

## 2019-07-19 DIAGNOSIS — E669 Obesity, unspecified: Secondary | ICD-10-CM | POA: Insufficient documentation

## 2019-07-19 DIAGNOSIS — I10 Essential (primary) hypertension: Secondary | ICD-10-CM | POA: Insufficient documentation

## 2019-07-19 DIAGNOSIS — Z96652 Presence of left artificial knee joint: Secondary | ICD-10-CM | POA: Diagnosis present

## 2019-07-19 DIAGNOSIS — Z20822 Contact with and (suspected) exposure to covid-19: Secondary | ICD-10-CM | POA: Insufficient documentation

## 2019-07-19 DIAGNOSIS — Z87891 Personal history of nicotine dependence: Secondary | ICD-10-CM | POA: Diagnosis not present

## 2019-07-19 DIAGNOSIS — M1710 Unilateral primary osteoarthritis, unspecified knee: Secondary | ICD-10-CM | POA: Diagnosis present

## 2019-07-19 DIAGNOSIS — Z6830 Body mass index (BMI) 30.0-30.9, adult: Secondary | ICD-10-CM | POA: Insufficient documentation

## 2019-07-19 HISTORY — PX: TOTAL KNEE ARTHROPLASTY: SHX125

## 2019-07-19 LAB — TYPE AND SCREEN
ABO/RH(D): O POS
Antibody Screen: NEGATIVE

## 2019-07-19 SURGERY — ARTHROPLASTY, KNEE, TOTAL
Anesthesia: Spinal | Site: Knee | Laterality: Left

## 2019-07-19 MED ORDER — TRANEXAMIC ACID-NACL 1000-0.7 MG/100ML-% IV SOLN
INTRAVENOUS | Status: AC
  Filled 2019-07-19: qty 100

## 2019-07-19 MED ORDER — VANCOMYCIN HCL IN DEXTROSE 1-5 GM/200ML-% IV SOLN: 1000.0000 mg | INTRAVENOUS | Status: DC

## 2019-07-19 MED ORDER — TRAMADOL HCL 50 MG PO TABS: 50.0000 mg | ORAL_TABLET | Freq: Four times a day (QID) | ORAL | Status: DC | PRN

## 2019-07-19 MED ORDER — 0.9 % SODIUM CHLORIDE (POUR BTL) OPTIME
TOPICAL | Status: DC | PRN
  Administered 2019-07-19: 1000 mL

## 2019-07-19 MED ORDER — DIPHENHYDRAMINE HCL 12.5 MG/5ML PO ELIX: 12.5000 mg | ORAL_SOLUTION | ORAL | Status: DC | PRN

## 2019-07-19 MED ORDER — TRIAMTERENE-HCTZ 37.5-25 MG PO TABS
2.0000 | ORAL_TABLET | Freq: Every day | ORAL | Status: DC
  Administered 2019-07-20: 1 via ORAL
  Filled 2019-07-19: qty 2

## 2019-07-19 MED ORDER — DEXAMETHASONE SODIUM PHOSPHATE 10 MG/ML IJ SOLN
INTRAMUSCULAR | Status: AC
  Filled 2019-07-19: qty 1

## 2019-07-19 MED ORDER — SODIUM CHLORIDE 0.9 % IR SOLN
Status: DC | PRN
  Administered 2019-07-19: 1000 mL

## 2019-07-19 MED ORDER — BUPIVACAINE IN DEXTROSE 0.75-8.25 % IT SOLN
INTRATHECAL | Status: DC | PRN
  Administered 2019-07-19: 1.7 mL via INTRATHECAL

## 2019-07-19 MED ORDER — POVIDONE-IODINE 10 % EX SWAB: 2.0000 "application " | Freq: Once | CUTANEOUS | Status: DC

## 2019-07-19 MED ORDER — OMEPRAZOLE 20 MG PO CPDR: 20.0000 mg | DELAYED_RELEASE_CAPSULE | Freq: Every day | ORAL | Status: DC | PRN

## 2019-07-19 MED ORDER — CEFAZOLIN SODIUM-DEXTROSE 2-4 GM/100ML-% IV SOLN
2.0000 g | Freq: Once | INTRAVENOUS | Status: AC
  Administered 2019-07-19: 2 g via INTRAVENOUS

## 2019-07-19 MED ORDER — EPHEDRINE SULFATE-NACL 50-0.9 MG/10ML-% IV SOSY
PREFILLED_SYRINGE | INTRAVENOUS | Status: DC | PRN
  Administered 2019-07-19: 10 mg via INTRAVENOUS

## 2019-07-19 MED ORDER — PHENYLEPHRINE HCL-NACL 10-0.9 MG/250ML-% IV SOLN
INTRAVENOUS | Status: DC | PRN
  Administered 2019-07-19: 40 ug/min via INTRAVENOUS

## 2019-07-19 MED ORDER — ACETAMINOPHEN 10 MG/ML IV SOLN
1000.0000 mg | Freq: Four times a day (QID) | INTRAVENOUS | Status: DC
  Administered 2019-07-19: 1000 mg via INTRAVENOUS

## 2019-07-19 MED ORDER — CEFAZOLIN SODIUM-DEXTROSE 2-4 GM/100ML-% IV SOLN
INTRAVENOUS | Status: AC
  Filled 2019-07-19: qty 100

## 2019-07-19 MED ORDER — PHENOL 1.4 % MT LIQD: 1.0000 | OROMUCOSAL | Status: DC | PRN

## 2019-07-19 MED ORDER — POLYETHYLENE GLYCOL 3350 17 G PO PACK: 17.0000 g | PACK | Freq: Every day | ORAL | Status: DC | PRN

## 2019-07-19 MED ORDER — PROPOFOL 500 MG/50ML IV EMUL
INTRAVENOUS | Status: DC | PRN
  Administered 2019-07-19: 50 ug/kg/min via INTRAVENOUS

## 2019-07-19 MED ORDER — PROPOFOL 10 MG/ML IV BOLUS
INTRAVENOUS | Status: AC
  Filled 2019-07-19: qty 20

## 2019-07-19 MED ORDER — PROPOFOL 10 MG/ML IV BOLUS
INTRAVENOUS | Status: DC | PRN
  Administered 2019-07-19 (×2): 20 mg via INTRAVENOUS
  Administered 2019-07-19: 10 mg via INTRAVENOUS
  Administered 2019-07-19: 20 mg via INTRAVENOUS
  Administered 2019-07-19: 10 mg via INTRAVENOUS
  Administered 2019-07-19 (×2): 20 mg via INTRAVENOUS
  Administered 2019-07-19 (×2): 10 mg via INTRAVENOUS

## 2019-07-19 MED ORDER — ASPIRIN EC 325 MG PO TBEC
325.0000 mg | DELAYED_RELEASE_TABLET | Freq: Two times a day (BID) | ORAL | Status: DC
  Administered 2019-07-20: 325 mg via ORAL
  Filled 2019-07-19: qty 1

## 2019-07-19 MED ORDER — VANCOMYCIN HCL IN DEXTROSE 1-5 GM/200ML-% IV SOLN
1000.0000 mg | Freq: Two times a day (BID) | INTRAVENOUS | Status: AC
  Administered 2019-07-19: 1000 mg via INTRAVENOUS
  Filled 2019-07-19: qty 200

## 2019-07-19 MED ORDER — ONDANSETRON HCL 4 MG/2ML IJ SOLN
INTRAMUSCULAR | Status: DC | PRN
  Administered 2019-07-19: 4 mg via INTRAVENOUS

## 2019-07-19 MED ORDER — SODIUM CHLORIDE (PF) 0.9 % IJ SOLN
INTRAMUSCULAR | Status: AC
  Filled 2019-07-19: qty 10

## 2019-07-19 MED ORDER — ACETAMINOPHEN 325 MG PO TABS: 325.0000 mg | ORAL_TABLET | Freq: Four times a day (QID) | ORAL | Status: DC | PRN

## 2019-07-19 MED ORDER — MORPHINE SULFATE (PF) 2 MG/ML IV SOLN: 1.0000 mg | INTRAVENOUS | Status: DC | PRN

## 2019-07-19 MED ORDER — ONDANSETRON HCL 4 MG/2ML IJ SOLN: 4.0000 mg | Freq: Four times a day (QID) | INTRAMUSCULAR | Status: DC | PRN

## 2019-07-19 MED ORDER — PANTOPRAZOLE SODIUM 40 MG IV SOLR
40.0000 mg | Freq: Once | INTRAVENOUS | Status: AC
  Administered 2019-07-19: 40 mg via INTRAVENOUS
  Filled 2019-07-19: qty 40

## 2019-07-19 MED ORDER — ONDANSETRON HCL 4 MG/2ML IJ SOLN: 4.0000 mg | Freq: Once | INTRAMUSCULAR | Status: DC | PRN

## 2019-07-19 MED ORDER — FENTANYL CITRATE (PF) 100 MCG/2ML IJ SOLN
INTRAMUSCULAR | Status: AC
  Filled 2019-07-19: qty 2

## 2019-07-19 MED ORDER — POTASSIUM CHLORIDE CRYS ER 10 MEQ PO TBCR
10.0000 meq | EXTENDED_RELEASE_TABLET | Freq: Two times a day (BID) | ORAL | Status: DC
  Administered 2019-07-19 (×2): 10 meq via ORAL
  Filled 2019-07-19 (×2): qty 1

## 2019-07-19 MED ORDER — POVIDONE-IODINE 10 % EX SWAB
2.0000 "application " | Freq: Once | CUTANEOUS | Status: AC
  Administered 2019-07-19: 2 via TOPICAL

## 2019-07-19 MED ORDER — DEXAMETHASONE SODIUM PHOSPHATE 10 MG/ML IJ SOLN
8.0000 mg | Freq: Once | INTRAMUSCULAR | Status: AC
  Administered 2019-07-19: 8 mg via INTRAVENOUS

## 2019-07-19 MED ORDER — HYDROCODONE-ACETAMINOPHEN 5-325 MG PO TABS
1.0000 | ORAL_TABLET | ORAL | Status: DC | PRN
  Administered 2019-07-19 (×2): 1 via ORAL
  Administered 2019-07-20 (×3): 2 via ORAL
  Filled 2019-07-19: qty 2
  Filled 2019-07-19 (×2): qty 1
  Filled 2019-07-19 (×2): qty 2

## 2019-07-19 MED ORDER — DOCUSATE SODIUM 100 MG PO CAPS
100.0000 mg | ORAL_CAPSULE | Freq: Two times a day (BID) | ORAL | Status: DC
  Administered 2019-07-19 – 2019-07-20 (×3): 100 mg via ORAL
  Filled 2019-07-19 (×3): qty 1

## 2019-07-19 MED ORDER — TRANEXAMIC ACID-NACL 1000-0.7 MG/100ML-% IV SOLN
1000.0000 mg | INTRAVENOUS | Status: AC
  Administered 2019-07-19: 1000 mg via INTRAVENOUS

## 2019-07-19 MED ORDER — FENTANYL CITRATE (PF) 100 MCG/2ML IJ SOLN
50.0000 ug | INTRAMUSCULAR | Status: DC
  Administered 2019-07-19: 50 ug via INTRAVENOUS

## 2019-07-19 MED ORDER — PROPOFOL 500 MG/50ML IV EMUL
INTRAVENOUS | Status: AC
  Filled 2019-07-19: qty 50

## 2019-07-19 MED ORDER — PHENYLEPHRINE 40 MCG/ML (10ML) SYRINGE FOR IV PUSH (FOR BLOOD PRESSURE SUPPORT)
PREFILLED_SYRINGE | INTRAVENOUS | Status: AC
  Filled 2019-07-19: qty 10

## 2019-07-19 MED ORDER — HYDROCODONE-ACETAMINOPHEN 7.5-325 MG PO TABS
1.0000 | ORAL_TABLET | ORAL | Status: DC | PRN
  Administered 2019-07-20: 1 via ORAL
  Filled 2019-07-19: qty 1

## 2019-07-19 MED ORDER — STERILE WATER FOR IRRIGATION IR SOLN
Status: DC | PRN
  Administered 2019-07-19: 1000 mL

## 2019-07-19 MED ORDER — OXYCODONE HCL 5 MG PO TABS: 5.0000 mg | ORAL_TABLET | ORAL | Status: DC | PRN

## 2019-07-19 MED ORDER — BUPIVACAINE LIPOSOME 1.3 % IJ SUSP
INTRAMUSCULAR | Status: DC | PRN
  Administered 2019-07-19: 20 mL

## 2019-07-19 MED ORDER — ACETAMINOPHEN 10 MG/ML IV SOLN
INTRAVENOUS | Status: AC
  Filled 2019-07-19: qty 100

## 2019-07-19 MED ORDER — METOCLOPRAMIDE HCL 5 MG/ML IJ SOLN: 5.0000 mg | Freq: Three times a day (TID) | INTRAMUSCULAR | Status: DC | PRN

## 2019-07-19 MED ORDER — METOCLOPRAMIDE HCL 5 MG PO TABS: 5.0000 mg | ORAL_TABLET | Freq: Three times a day (TID) | ORAL | Status: DC | PRN

## 2019-07-19 MED ORDER — FLEET ENEMA 7-19 GM/118ML RE ENEM: 1.0000 | ENEMA | Freq: Once | RECTAL | Status: DC | PRN

## 2019-07-19 MED ORDER — SODIUM CHLORIDE 0.9 % IV SOLN: INTRAVENOUS | Status: DC

## 2019-07-19 MED ORDER — ONDANSETRON HCL 4 MG/2ML IJ SOLN
INTRAMUSCULAR | Status: AC
  Filled 2019-07-19: qty 2

## 2019-07-19 MED ORDER — CALCIUM CARBONATE ANTACID 500 MG PO CHEW
2.0000 | CHEWABLE_TABLET | Freq: Three times a day (TID) | ORAL | Status: DC | PRN
  Administered 2019-07-20 (×2): 400 mg via ORAL
  Filled 2019-07-19 (×2): qty 2

## 2019-07-19 MED ORDER — MENTHOL 3 MG MT LOZG: 1.0000 | LOZENGE | OROMUCOSAL | Status: DC | PRN

## 2019-07-19 MED ORDER — BUPIVACAINE-EPINEPHRINE (PF) 0.5% -1:200000 IJ SOLN
INTRAMUSCULAR | Status: DC | PRN
  Administered 2019-07-19: 25 mL via PERINEURAL

## 2019-07-19 MED ORDER — CLONIDINE HCL (ANALGESIA) 100 MCG/ML EP SOLN
EPIDURAL | Status: DC | PRN
  Administered 2019-07-19: 80 ug

## 2019-07-19 MED ORDER — TRANEXAMIC ACID-NACL 1000-0.7 MG/100ML-% IV SOLN
1000.0000 mg | Freq: Once | INTRAVENOUS | Status: AC
  Administered 2019-07-19: 1000 mg via INTRAVENOUS
  Filled 2019-07-19: qty 100

## 2019-07-19 MED ORDER — EPHEDRINE 5 MG/ML INJ
INTRAVENOUS | Status: AC
  Filled 2019-07-19: qty 10

## 2019-07-19 MED ORDER — METHOCARBAMOL 500 MG IVPB - SIMPLE MED
500.0000 mg | Freq: Four times a day (QID) | INTRAVENOUS | Status: DC | PRN
  Filled 2019-07-19: qty 50

## 2019-07-19 MED ORDER — BISACODYL 10 MG RE SUPP: 10.0000 mg | Freq: Every day | RECTAL | Status: DC | PRN

## 2019-07-19 MED ORDER — METHOCARBAMOL 500 MG PO TABS: 500.0000 mg | ORAL_TABLET | Freq: Four times a day (QID) | ORAL | Status: DC | PRN

## 2019-07-19 MED ORDER — SODIUM CHLORIDE (PF) 0.9 % IJ SOLN
INTRAMUSCULAR | Status: AC
  Filled 2019-07-19: qty 50

## 2019-07-19 MED ORDER — FENTANYL CITRATE (PF) 100 MCG/2ML IJ SOLN: 25.0000 ug | INTRAMUSCULAR | Status: DC | PRN

## 2019-07-19 MED ORDER — LACTATED RINGERS IV SOLN: INTRAVENOUS | Status: DC

## 2019-07-19 MED ORDER — SODIUM CHLORIDE (PF) 0.9 % IJ SOLN
INTRAMUSCULAR | Status: DC | PRN
  Administered 2019-07-19: 60 mL

## 2019-07-19 MED ORDER — ONDANSETRON HCL 4 MG PO TABS: 4.0000 mg | ORAL_TABLET | Freq: Four times a day (QID) | ORAL | Status: DC | PRN

## 2019-07-19 SURGICAL SUPPLY — 68 items
AUGMENT PFC SIG RP SZ2.5 12.5M (Knees) IMPLANT
BAG SPEC THK2 15X12 ZIP CLS (MISCELLANEOUS) ×1
BAG ZIPLOCK 12X15 (MISCELLANEOUS) ×3 IMPLANT
BLADE SAG 18X100X1.27 (BLADE) ×3 IMPLANT
BLADE SAW SGTL 11.0X1.19X90.0M (BLADE) ×3 IMPLANT
BLADE SURG SZ10 CARB STEEL (BLADE) ×6 IMPLANT
BNDG ELASTIC 6X5.8 VLCR STR LF (GAUZE/BANDAGES/DRESSINGS) ×3 IMPLANT
BOWL SMART MIX CTS (DISPOSABLE) ×3 IMPLANT
CEMENT HV SMART SET (Cement) ×6 IMPLANT
CEMENT TIBIA MBT (Knees) IMPLANT
CLOSURE STERI-STRIP 1/2X4 (GAUZE/BANDAGES/DRESSINGS) ×2
CLOSURE WOUND 1/2 X4 (GAUZE/BANDAGES/DRESSINGS) ×2
CLSR STERI-STRIP ANTIMIC 1/2X4 (GAUZE/BANDAGES/DRESSINGS) ×2 IMPLANT
COVER SURGICAL LIGHT HANDLE (MISCELLANEOUS) ×3 IMPLANT
COVER WAND RF STERILE (DRAPES) ×2 IMPLANT
CUFF TOURN SGL QUICK 34 (TOURNIQUET CUFF) ×3
CUFF TRNQT CYL 34X4.125X (TOURNIQUET CUFF) ×1 IMPLANT
DECANTER SPIKE VIAL GLASS SM (MISCELLANEOUS) ×3 IMPLANT
DRAPE U-SHAPE 47X51 STRL (DRAPES) ×3 IMPLANT
DRESSING AQUACEL AG SP 3.5X10 (GAUZE/BANDAGES/DRESSINGS) IMPLANT
DRSG ADAPTIC 3X8 NADH LF (GAUZE/BANDAGES/DRESSINGS) ×3 IMPLANT
DRSG AQUACEL AG ADV 3.5X10 (GAUZE/BANDAGES/DRESSINGS) ×2 IMPLANT
DRSG AQUACEL AG SP 3.5X10 (GAUZE/BANDAGES/DRESSINGS) ×3
DRSG PAD ABDOMINAL 8X10 ST (GAUZE/BANDAGES/DRESSINGS) ×3 IMPLANT
DURAPREP 26ML APPLICATOR (WOUND CARE) ×3 IMPLANT
ELECT REM PT RETURN 15FT ADLT (MISCELLANEOUS) ×3 IMPLANT
EVACUATOR 1/8 PVC DRAIN (DRAIN) ×3 IMPLANT
FEMUR SIGMA PS SZ 2.5 L (Knees) ×2 IMPLANT
GAUZE SPONGE 2X2 8PLY STRL LF (GAUZE/BANDAGES/DRESSINGS) IMPLANT
GAUZE SPONGE 4X4 12PLY STRL (GAUZE/BANDAGES/DRESSINGS) ×3 IMPLANT
GLOVE BIO SURGEON STRL SZ7 (GLOVE) ×3 IMPLANT
GLOVE BIO SURGEON STRL SZ8 (GLOVE) ×3 IMPLANT
GLOVE BIOGEL PI IND STRL 6.5 (GLOVE) ×1 IMPLANT
GLOVE BIOGEL PI IND STRL 7.0 (GLOVE) ×1 IMPLANT
GLOVE BIOGEL PI IND STRL 8 (GLOVE) ×1 IMPLANT
GLOVE BIOGEL PI INDICATOR 6.5 (GLOVE) ×2
GLOVE BIOGEL PI INDICATOR 7.0 (GLOVE) ×2
GLOVE BIOGEL PI INDICATOR 8 (GLOVE) ×2
GLOVE SURG SS PI 6.5 STRL IVOR (GLOVE) ×3 IMPLANT
GOWN STRL REUS W/TWL LRG LVL3 (GOWN DISPOSABLE) ×9 IMPLANT
HANDPIECE INTERPULSE COAX TIP (DISPOSABLE) ×3
HOLDER FOLEY CATH W/STRAP (MISCELLANEOUS) IMPLANT
IMMOBILIZER KNEE 20 (SOFTGOODS) ×3
IMMOBILIZER KNEE 20 THIGH 36 (SOFTGOODS) ×1 IMPLANT
KIT TURNOVER KIT A (KITS) IMPLANT
MANIFOLD NEPTUNE II (INSTRUMENTS) ×3 IMPLANT
NS IRRIG 1000ML POUR BTL (IV SOLUTION) ×3 IMPLANT
PACK TOTAL KNEE CUSTOM (KITS) ×3 IMPLANT
PADDING CAST COTTON 6X4 STRL (CAST SUPPLIES) ×5 IMPLANT
PATELLA DOME PFC 38MM (Knees) ×2 IMPLANT
PENCIL SMOKE EVACUATOR (MISCELLANEOUS) ×2 IMPLANT
PFC SIGMA RP STB SZ 2.5 12.5M (Knees) ×3 IMPLANT
PIN STEINMAN FIXATION KNEE (PIN) ×2 IMPLANT
PROTECTOR NERVE ULNAR (MISCELLANEOUS) ×3 IMPLANT
SET HNDPC FAN SPRY TIP SCT (DISPOSABLE) ×1 IMPLANT
SPONGE GAUZE 2X2 STER 10/PKG (GAUZE/BANDAGES/DRESSINGS) ×2
STRIP CLOSURE SKIN 1/2X4 (GAUZE/BANDAGES/DRESSINGS) ×4 IMPLANT
SUT MNCRL AB 4-0 PS2 18 (SUTURE) ×3 IMPLANT
SUT STRATAFIX 0 PDS 27 VIOLET (SUTURE) ×3
SUT VIC AB 2-0 CT1 27 (SUTURE) ×9
SUT VIC AB 2-0 CT1 TAPERPNT 27 (SUTURE) ×3 IMPLANT
SUTURE STRATFX 0 PDS 27 VIOLET (SUTURE) ×1 IMPLANT
TIBIA MBT CEMENT (Knees) ×3 IMPLANT
TRAY FOLEY MTR SLVR 14FR STAT (SET/KITS/TRAYS/PACK) ×2 IMPLANT
TRAY FOLEY MTR SLVR 16FR STAT (SET/KITS/TRAYS/PACK) ×1 IMPLANT
WATER STERILE IRR 1000ML POUR (IV SOLUTION) ×6 IMPLANT
WRAP KNEE MAXI GEL POST OP (GAUZE/BANDAGES/DRESSINGS) ×3 IMPLANT
YANKAUER SUCT BULB TIP 10FT TU (MISCELLANEOUS) ×3 IMPLANT

## 2019-07-19 NOTE — Evaluation (Signed)
Physical Therapy Evaluation Patient Details Name: Crystal Ramsey MRN: [id number] DOB: 02/05/1936 Today's Date: 07/19/2019   History of Present Illness  s/p L TKA; PMH: bil THA, TKA  Clinical Impression  Pt is s/p TKA resulting in the deficits listed below (see PT Problem List).  Pt amb 40' with RW and min assist. Anticipate steady progress in acute setting    Pt will benefit from skilled PT to increase their independence and safety with mobility to allow discharge to the venue listed below.      Follow Up Recommendations Follow surgeon's recommendation for DC plan and follow-up therapies    Equipment Recommendations  None recommended by PT    Recommendations for Other Services       Precautions / Restrictions Precautions Precautions: Fall;Knee Required Braces or Orthoses: Knee Immobilizer - Left Knee Immobilizer - Left: Discontinue once straight leg raise with < 10 degree lag Restrictions Weight Bearing Restrictions: No Other Position/Activity Restrictions: WBAT      Mobility  Bed Mobility Overal bed mobility: Needs Assistance Bed Mobility: Supine to Sit     Supine to sit: Min assist     General bed mobility comments: assist with LLE  Transfers Overall transfer level: Needs assistance Equipment used: Rolling walker (2 wheeled) Transfers: Sit to/from Stand Sit to Stand: Min assist         General transfer comment: cues for hand placement and RLE position  Ambulation/Gait Ambulation/Gait assistance: Min assist;Min guard Gait Distance (Feet): 40 Feet Assistive device: Rolling walker (2 wheeled) Gait Pattern/deviations: Step-to pattern;Decreased stance time - left     General Gait Details: cues for sequence, posture  Stairs            Wheelchair Mobility    Modified Rankin (Stroke Patients Only)       Balance                                             Pertinent Vitals/Pain Pain Assessment: 0-10 Pain Score: 3  Pain  Location: L knee Pain Descriptors / Indicators: Discomfort Pain Intervention(s): Monitored during session;Limited activity within patient's tolerance;Premedicated before session;Repositioned    Home Living Family/patient expects to be discharged to:: Private residence Living Arrangements: Spouse/significant other Available Help at Discharge: Family Type of Home: House Home Access: Stairs to enter   Technical brewer of Steps: 3 and 1 Home Layout: Two level;Able to live on main level with bedroom/bathroom Home Equipment: Gilford Rile - 2 wheels;Bedside commode;Cane - single point      Prior Function Level of Independence: Independent;Independent with assistive device(s)         Comments: amb with cane d/t knee pain     Hand Dominance        Extremity/Trunk Assessment   Upper Extremity Assessment Upper Extremity Assessment: Overall WFL for tasks assessed    Lower Extremity Assessment Lower Extremity Assessment: LLE deficits/detail LLE Deficits / Details: ankle WFL, knee extension  and hip flexion  3/5. AAROM grossly 10 to 55 degrees flexion       Communication   Communication: No difficulties  Cognition Arousal/Alertness: Awake/alert Behavior During Therapy: WFL for tasks assessed/performed Overall Cognitive Status: Within Functional Limits for tasks assessed  General Comments      Exercises Total Joint Exercises Ankle Circles/Pumps: AROM;Both;10 reps   Assessment/Plan    PT Assessment Patient needs continued PT services  PT Problem List Decreased strength;Decreased range of motion;Decreased activity tolerance;Decreased mobility;Decreased knowledge of use of DME       PT Treatment Interventions DME instruction;Gait training;Functional mobility training;Therapeutic activities;Patient/family education;Therapeutic exercise    PT Goals (Current goals can be found in the Care Plan section)  Acute Rehab PT  Goals Patient Stated Goal: walk with less pain PT Goal Formulation: With patient Time For Goal Achievement: 07/26/19 Potential to Achieve Goals: Good    Frequency 7X/week   Barriers to discharge        Co-evaluation               AM-PAC PT "6 Clicks" Mobility  Outcome Measure Help needed turning from your back to your side while in a flat bed without using bedrails?: A Little Help needed moving from lying on your back to sitting on the side of a flat bed without using bedrails?: A Little Help needed moving to and from a bed to a chair (including a wheelchair)?: A Little Help needed standing up from a chair using your arms (e.g., wheelchair or bedside chair)?: A Little Help needed to walk in hospital room?: A Little Help needed climbing 3-5 steps with a railing? : A Lot 6 Click Score: 17    End of Session Equipment Utilized During Treatment: Gait belt;Left knee immobilizer Activity Tolerance: Patient tolerated treatment well Patient left: in chair;with call bell/phone within reach;with chair alarm set   PT Visit Diagnosis: Difficulty in walking, not elsewhere classified (R26.2)    Time: [id number] PT Time Calculation (min) (ACUTE ONLY): 28 min   Charges:   PT Evaluation $PT Eval Low Complexity: 1 Low PT Treatments $Gait Training: 8-22 mins        Baxter Flattery, PT   Acute Rehab Dept [facility]): [id number]   07/19/2019   [facility] 07/19/2019, 4:54 PM

## 2019-07-19 NOTE — Care Plan (Signed)
Ortho Bundle Case Management Note  Patient Details  Name: Crystal Ramsey MRN: [id number] Date of Birth: 1936/05/04  L TKA on 07-19-19 DCP:  Home with spouse and dtr.  2 story home with 3 ste. DME:  No needs.  Has a RW and 3-in-1. PT:  [facility].  PT eval scheduled on 07-22-19.                   DME Arranged:  N/A DME Agency:  NA  HH Arranged:  NA HH Agency:  NA  Additional Comments: Please contact me with any questions of if this plan should need to change.  Marianne Sofia, RN,CCM [facility]  [phone] 07/19/2019, 3:57 PM

## 2019-07-19 NOTE — Anesthesia Procedure Notes (Signed)
Anesthesia Regional Block: Adductor canal block   Pre-Anesthetic Checklist: ,, timeout performed, Correct Patient, Correct Site, Correct Laterality, Correct Procedure, Correct Position, site marked, Risks and benefits discussed,  Surgical consent,  Pre-op evaluation,  At surgeon's request and post-op pain management  Laterality: Left  Prep: chloraprep       Needles:  Injection technique: Single-shot  Needle Type: Stimiplex     Needle Length: 9cm  Needle Gauge: 21     Additional Needles:   Narrative:  Start time: 07/19/2019 10:14 AM End time: 07/19/2019 10:19 AM Injection made incrementally with aspirations every 5 mL.  Performed by: Personally  Anesthesiologist: Nolon Nations, MD  Additional Notes: BP cuff, EKG monitors applied. Sedation begun. Artery and nerve location verified with U/S and anesthetic injected incrementally, slowly, and after negative aspirations under direct u/s guidance. Good fascial /perineural spread. Tolerated well.

## 2019-07-19 NOTE — Progress Notes (Signed)
Assisted Dr. Nolon Nations  with Left knee adductor canal  block. Side rails up, monitors on throughout procedure. See vital signs in flow sheet. Tolerated Procedure well.

## 2019-07-19 NOTE — Transfer of Care (Signed)
Immediate Anesthesia Transfer of Care Note  Patient: Crystal Ramsey  Procedure(s) Performed: TOTAL KNEE ARTHROPLASTY (Left Knee)  Patient Location: PACU  Anesthesia Type:Spinal  Level of Consciousness: drowsy and patient cooperative  Airway & Oxygen Therapy: Patient Spontanous Breathing and Patient connected to face mask oxygen  Post-op Assessment: Report given to RN and Post -op Vital signs reviewed and stable  Post vital signs: Reviewed and stable  Last Vitals:  Vitals Value Taken Time  BP 122/62 07/19/19 1205  Temp    Pulse 74 07/19/19 1208  Resp 13 07/19/19 1208  SpO2 98 % 07/19/19 1208  Vitals shown include unvalidated device data.  Last Pain:  Vitals:   07/19/19 0843  TempSrc: Oral      Patients Stated Pain Goal: 4 ([id number])  Complications: No apparent anesthesia complications

## 2019-07-19 NOTE — Care Management CC44 (Signed)
Condition Code 44 Documentation Completed  Patient Details  Name: Crystal Ramsey MRN: [id number] Date of Birth: 05-Aug-1935   Condition Code 44 given:  Yes Patient signature on Condition Code 44 notice:  Yes Documentation of 2 MD's agreement:    Code 44 added to claim:  Yes    Lia Hopping, LCSW 07/19/2019, 3:37 PM

## 2019-07-19 NOTE — Anesthesia Procedure Notes (Signed)
Spinal  Patient location during procedure: OR Start time: 07/19/2019 10:30 AM End time: 07/19/2019 10:34 AM Staffing Performed: resident/CRNA  Anesthesiologist: Nolon Nations, MD Resident/CRNA: Montel Clock, CRNA Preanesthetic Checklist Completed: patient identified, IV checked, risks and benefits discussed, surgical consent, monitors and equipment checked, pre-op evaluation and timeout performed Spinal Block Patient position: sitting Prep: DuraPrep Patient monitoring: heart rate, cardiac monitor, continuous pulse ox and blood pressure Approach: midline Location: L3-4 Injection technique: single-shot Needle Needle type: Pencan  Needle gauge: 24 G Needle length: 10 cm Needle insertion depth: 7 cm Assessment Sensory level: T6

## 2019-07-19 NOTE — Care Management Obs Status (Signed)
Real NOTIFICATION   Patient Details  Name: Crystal Ramsey MRN: [id number] Date of Birth: 1935-06-22   Medicare Observation Status Notification Given:  Yes    Lia Hopping, [place] 07/19/2019, 3:37 PM

## 2019-07-19 NOTE — Plan of Care (Signed)

## 2019-07-19 NOTE — Anesthesia Postprocedure Evaluation (Signed)
Anesthesia Post Note  Patient: Crystal Ramsey  Procedure(s) Performed: TOTAL KNEE ARTHROPLASTY (Left Knee)     Patient location during evaluation: PACU Anesthesia Type: Spinal Level of consciousness: awake and alert Pain management: pain level controlled Vital Signs Assessment: post-procedure vital signs reviewed and stable Respiratory status: spontaneous breathing and respiratory function stable Cardiovascular status: blood pressure returned to baseline and stable Postop Assessment: spinal receding Anesthetic complications: no    Last Vitals:  Vitals:   07/19/19 1408 07/19/19 1458  BP: (!) 169/75 (!) 161/68  Pulse: 70 70  Resp: 18 18  Temp:    SpO2: 100% 99%    Last Pain:  Vitals:   07/19/19 1308  TempSrc: Axillary  PainSc: 0-No pain                 Nolon Nations

## 2019-07-19 NOTE — Op Note (Signed)
OPERATIVE REPORT-TOTAL KNEE ARTHROPLASTY   Pre-operative diagnosis- Osteoarthritis  Left knee(s)  Post-operative diagnosis- Osteoarthritis Left knee(s)  Procedure-  Left  Total Knee Arthroplasty  Surgeon- Crystal Plover. Rito Lecomte, MD  Assistant- Theresa Duty, PA-C   Anesthesia-  Adductor canal block and Spinal  EBL-150 mL   Drains Hemovac  Tourniquet time-  Total Tourniquet Time Documented: Thigh (Left) - 38 minutes Total: Thigh (Left) - 38 minutes     Complications- None  Condition-PACU - hemodynamically stable.   Brief Clinical Note  Crystal Ramsey is a 84 y.o. year old female with end stage OA of her left knee with progressively worsening pain and dysfunction. She has constant pain, with activity and at rest and significant functional deficits with difficulties even with ADLs. She has had extensive non-op management including analgesics, injections of cortisone and viscosupplements, and home exercise program, but remains in significant pain with significant dysfunction. Radiographs show bone on bone arthritis medial and patellofemoral. She presents now for left Total Knee Arthroplasty.    Procedure in detail---   The patient is brought into the operating room and positioned supine on the operating table. After successful administration of  Adductor canal block and Spinal,   a tourniquet is placed high on the  Left thigh(s) and the lower extremity is prepped and draped in the usual sterile fashion. Time out is performed by the operating team and then the  Left lower extremity is wrapped in Esmarch, knee flexed and the tourniquet inflated to 300 mmHg.       A midline incision is made with a ten blade through the subcutaneous tissue to the level of the extensor mechanism. A fresh blade is used to make a medial parapatellar arthrotomy. Soft tissue over the proximal medial tibia is subperiosteally elevated to the joint line with a knife and into the semimembranosus bursa with a  Cobb elevator. Soft tissue over the proximal lateral tibia is elevated with attention being paid to avoiding the patellar tendon on the tibial tubercle. The patella is everted, knee flexed 90 degrees and the ACL and PCL are removed. Findings are bone on bone lateral and patellofemoral with large global osteophytes        The drill is used to create a starting hole in the distal femur and the canal is thoroughly irrigated with sterile saline to remove the fatty contents. The 5 degree Left  valgus alignment guide is placed into the femoral canal and the distal femoral cutting block is pinned to remove 10  mm off the distal femur. Resection is made with an oscillating saw.      The tibia is subluxed forward and the menisci are removed. The extramedullary alignment guide is placed referencing proximally at the medial aspect of the tibial tubercle and distally along the second metatarsal axis and tibial crest. The block is pinned to remove 2 mm off the more deficient lateral  side. Resection is made with an oscillating saw. Size 3is the most appropriate size for the tibia and the proximal tibia is prepared with the modular drill and keel punch for that size.      The femoral sizing guide is placed and size 2.5 is most appropriate. Rotation is marked off the epicondylar axis and confirmed by creating a rectangular flexion gap at 90 degrees. The size 2.5 cutting block is pinned in this rotation and the anterior, posterior and chamfer cuts are made with the oscillating saw. The intercondylar block is then placed and that cut is  made.      Trial size 3 tibial component, trial size 2.5 posterior stabilized femur and a 12.5  mm posterior stabilized rotating platform insert trial is placed. Full extension is achieved with excellent varus/valgus and anterior/posterior balance throughout full range of motion. The patella is everted and thickness measured to be 22  mm. Free hand resection is taken to 12 mm, a 38 template is  placed, lug holes are drilled, trial patella is placed, and it tracks normally. Osteophytes are removed off the posterior femur with the trial in place. All trials are removed and the cut bone surfaces prepared with pulsatile lavage. Cement is mixed and once ready for implantation, the size 3 tibial implant, size  2.5 posterior stabilized femoral component, and the size 38 patella are cemented in place and the patella is held with the clamp. The trial insert is placed and the knee held in full extension. The Exparel (20 ml mixed with 60 ml saline) is injected into the extensor mechanism, posterior capsule, medial and lateral gutters and subcutaneous tissues.  All extruded cement is removed and once the cement is hard the permanent 12.5 mm posterior stabilized rotating platform insert is placed into the tibial tray.      The wound is copiously irrigated with saline solution and the extensor mechanism closed over a hemovac drain with #1 V-loc suture. The tourniquet is released for a total tourniquet time of 38  minutes. Flexion against gravity is 140 degrees and the patella tracks normally. Subcutaneous tissue is closed with 2.0 vicryl and subcuticular with running 4.0 Monocryl. The incision is cleaned and dried and steri-strips and a bulky sterile dressing are applied. The limb is placed into a knee immobilizer and the patient is awakened and transported to recovery in stable condition.      Please note that a surgical assistant was a medical necessity for this procedure in order to perform it in a safe and expeditious manner. Surgical assistant was necessary to retract the ligaments and vital neurovascular structures to prevent injury to them and also necessary for proper positioning of the limb to allow for anatomic placement of the prosthesis.   Crystal Plover Marishka Rentfrow, MD    07/19/2019, 11:40 AM

## 2019-07-19 NOTE — Interval H&P Note (Signed)
History and Physical Interval Note:  07/19/2019 9:40 AM  Crystal Ramsey  has presented today for surgery, with the diagnosis of Left knee osteoarthritis.  The various methods of treatment have been discussed with the patient and family. After consideration of risks, benefits and other options for treatment, the patient has consented to  Procedure(s) with comments: TOTAL KNEE ARTHROPLASTY (Left) - 27min as a surgical intervention.  The patient's history has been reviewed, patient examined, no change in status, stable for surgery.  I have reviewed the patient's chart and labs.  Questions were answered to the patient's satisfaction.     Pilar Plate Habeeb Puertas

## 2019-07-19 NOTE — Discharge Instructions (Addendum)
Crystal Arabian, MD Total Joint Specialist [facility] [place] [address] [place], [place] [address] [phone]  TOTAL KNEE REPLACEMENT POSTOPERATIVE DIRECTIONS    Knee Rehabilitation, Guidelines Following Surgery  Results after knee surgery are often greatly improved when you follow the exercise, range of motion and muscle strengthening exercises prescribed by your doctor. Safety measures are also important to protect the knee from further injury. If any of these exercises cause you to have increased pain or swelling in your knee joint, decrease the amount until you are comfortable again and slowly increase them. If you have problems or questions, call your caregiver or physical therapist for advice.   BLOOD CLOT PREVENTION . Take a 325 mg Aspirin two times a day for three weeks following surgery. Then take an 81 mg Aspirin once a day for three weeks. Then discontinue Aspirin. [name] may resume your vitamins/supplements upon discharge from the hospital. . Do not take any NSAIDs (Advil, Aleve, Ibuprofen, Meloxicam, etc.) until you have discontinued the 325 mg Aspirin. [name]  HOME CARE INSTRUCTIONS  . Remove items at home which could result in a fall. This includes throw rugs or furniture in walking pathways.  . ICE to the affected knee as much as tolerated. Icing helps control swelling. If the swelling is well controlled you will be more comfortable and rehab easier. Continue to use ice on the knee for pain and swelling from surgery. You may notice swelling that will progress down to the foot and ankle. This is normal after surgery. Elevate the leg when you are not up walking on it.    . Continue to use the breathing machine which will help keep your temperature down. It is common for your temperature to cycle up and down following surgery, especially at night when you are not up moving around and exerting yourself. The breathing machine keeps your lungs expanded and your  temperature down. . Do not place pillow under the operative knee, focus on keeping the knee straight while resting  DIET You may resume your previous home diet once you are discharged from the hospital.  DRESSING / [place] / SHOWERING . Keep your bulky bandage on for 2 days. On the third post-operative day you may remove the Ace bandage and gauze. There is a waterproof adhesive bandage on your skin which will stay in place until your first follow-up appointment. Once you remove this you will not need to place another bandage . You may begin showering 3 days following surgery, but do not submerge the incision under water.  ACTIVITY For the first 5 days, the key is rest and control of pain and swelling . Do your home exercises twice a day starting on post-operative day 3. On the days you go to physical therapy, just do the home exercises once that day. . You should rest, ice and elevate the leg for 50 minutes out of every hour. Get up and walk/stretch for 10 minutes per hour. After 5 days you can increase your activity slowly as tolerated. . Walk with your walker as instructed. Use the walker until you are comfortable transitioning to a cane. Walk with the cane in the opposite hand of the operative leg. You may discontinue the cane once you are comfortable and walking steadily. . Avoid periods of inactivity such as sitting longer than an hour when not asleep. This helps prevent blood clots.  . You may discontinue the knee immobilizer once you are able to perform a  straight leg raise while lying down. . You may resume a sexual relationship in one month or when given the OK by your doctor.  . You may return to work once you are cleared by your doctor.  . Do not drive a car for 6 weeks or until released by you surgeon.  . Do not drive while taking narcotics.  TED HOSE STOCKINGS Wear the elastic stockings on both legs for three weeks following surgery during the day. You may remove them at night  for sleeping.  WEIGHT BEARING Weight bearing as tolerated with assist device (walker, cane, etc) as directed, use it as long as suggested by your surgeon or therapist, typically at least 4-6 weeks.  POSTOPERATIVE CONSTIPATION PROTOCOL Constipation - defined medically as fewer than three stools per week and severe constipation as less than one stool per week.  One of the most common issues patients have following surgery is constipation.  Even if you have a regular bowel pattern at home, your normal regimen is likely to be disrupted due to multiple reasons following surgery.  Combination of anesthesia, postoperative narcotics, change in appetite and fluid intake all can affect your bowels.  In order to avoid complications following surgery, here are some recommendations in order to help you during your recovery period.  . Colace (docusate) - Pick up an over-the-counter form of Colace or another stool softener and take twice a day as long as you are requiring postoperative pain medications.  Take with a full glass of water daily.  If you experience loose stools or diarrhea, hold the colace until you stool forms back up. If your symptoms do not get better within 1 week or if they get worse, check with your doctor. . Dulcolax (bisacodyl) - Pick up over-the-counter and take as directed by the product packaging as needed to assist with the movement of your bowels.  Take with a full glass of water.  Use this product as needed if not relieved by Colace only.  . MiraLax (polyethylene glycol) - Pick up over-the-counter to have on hand. MiraLax is a solution that will increase the amount of water in your bowels to assist with bowel movements.  Take as directed and can mix with a glass of water, juice, soda, coffee, or tea. Take if you go more than two days without a movement. Do not use MiraLax more than once per day. Call your doctor if you are still constipated or irregular after using this medication for 7 days  in a row.  If you continue to have problems with postoperative constipation, please contact the office for further assistance and recommendations.  If you experience "the worst abdominal pain ever" or develop nausea or vomiting, please contact the office immediatly for further recommendations for treatment.  ITCHING If you experience itching with your medications, try taking only a single pain pill, or even half a pain pill at a time.  You can also use Benadryl over the counter for itching or also to help with sleep.   MEDICATIONS See your medication summary on the "After Visit Summary" that the nursing staff will review with you prior to discharge.  You may have some home medications which will be placed on hold until you complete the course of blood thinner medication.  It is important for you to complete the blood thinner medication as prescribed by your surgeon.  Continue your approved medications as instructed at time of discharge.  PRECAUTIONS . If you experience chest pain or shortness  of breath - call 911 immediately for transfer to the hospital emergency department.  . If you develop a fever greater that 101 F, purulent drainage from wound, increased redness or drainage from wound, foul odor from the wound/dressing, or calf pain - CONTACT YOUR SURGEON.                                                   FOLLOW-UP APPOINTMENTS Make sure you keep all of your appointments after your operation with your surgeon and caregivers. You should call the office at the above phone number and make an appointment for approximately two weeks after the date of your surgery or on the date instructed by your surgeon outlined in the "After Visit Summary".  RANGE OF MOTION AND STRENGTHENING EXERCISES  Rehabilitation of the knee is important following a knee injury or an operation. After just a few days of immobilization, the muscles of the thigh which control the knee become weakened and shrink (atrophy). Knee  exercises are designed to build up the tone and strength of the thigh muscles and to improve knee motion. Often times heat used for twenty to thirty minutes before working out will loosen up your tissues and help with improving the range of motion but do not use heat for the first two weeks following surgery. These exercises can be done on a training (exercise) mat, on the floor, on a table or on a bed. Use what ever works the best and is most comfortable for you Knee exercises include:  . Leg Lifts - While your knee is still immobilized in a splint or cast, you can do straight leg raises. Lift the leg to 60 degrees, hold for 3 sec, and slowly lower the leg. Repeat 10-20 times 2-3 times daily. Perform this exercise against resistance later as your knee gets better.  Javier Docker and Hamstring Sets - Tighten up the muscle on the front of the thigh (Quad) and hold for 5-10 sec. Repeat this 10-20 times hourly. Hamstring sets are done by pushing the foot backward against an object and holding for 5-10 sec. Repeat as with quad sets.   Leg Slides: Lying on your back, slowly slide your foot toward your buttocks, bending your knee up off the floor (only go as far as is comfortable). Then slowly slide your foot back down until your leg is flat on the floor again.  Angel Wings: Lying on your back spread your legs to the side as far apart as you can without causing discomfort.  A rehabilitation program following serious knee injuries can speed recovery and prevent re-injury in the future due to weakened muscles. Contact your doctor or a physical therapist for more information on knee rehabilitation.   IF YOU ARE TRANSFERRED TO A SKILLED REHAB FACILITY If the patient is transferred to a skilled rehab facility following release from the hospital, a list of the current medications will be sent to the facility for the patient to continue.  When discharged from the skilled rehab facility, please have the facility set up the  patient's [place] prior to being released. Also, the skilled facility will be responsible for providing the patient with their medications at time of release from the facility to include their pain medication, the muscle relaxants, and their blood thinner medication. If the patient is still at  the rehab facility at time of the two week follow up appointment, the skilled rehab facility will also need to assist the patient in arranging follow up appointment in our office and any transportation needs.  MAKE SURE YOU:  . Understand these instructions.  . Get help right away if you are not doing well or get worse.    Pick up stool softner and laxative for home use following surgery while on pain medications. Do not submerge incision under water. Please use good hand washing techniques while changing dressing each day. May shower starting three days after surgery. Please use a clean towel to pat the incision dry following showers. Continue to use ice for pain and swelling after surgery. Do not use any lotions or creams on the incision until instructed by your surgeon.

## 2019-07-19 NOTE — Anesthesia Preprocedure Evaluation (Signed)
Anesthesia Evaluation  Patient identified by MRN, date of birth, ID band Patient awake    Reviewed: Allergy & Precautions, H&P , NPO status , Patient's Chart, lab work & pertinent test results  History of Anesthesia Complications (+) PONV  Airway Mallampati: II  TM Distance: >3 FB Neck ROM: Full    Dental  (+) Caps, Dental Advisory Given All upper front are capped:   Pulmonary neg pulmonary ROS, former smoker,    Pulmonary exam normal breath sounds clear to auscultation       Cardiovascular hypertension, Pt. on medications Normal cardiovascular exam Rhythm:Regular Rate:Normal     Neuro/Psych negative neurological ROS  negative psych ROS   GI/Hepatic Neg liver ROS, GERD  Medicated,  Endo/Other  negative endocrine ROS  Renal/GU negative Renal ROS  negative genitourinary   Musculoskeletal  (+) Fibromyalgia -  Abdominal   Peds negative pediatric ROS (+)  Hematology negative hematology ROS (+)   Anesthesia Other Findings   Reproductive/Obstetrics negative OB ROS                             Anesthesia Physical  Anesthesia Plan  ASA: II  Anesthesia Plan: Spinal   Post-op Pain Management:  [facility] for Post-op pain   Induction:   PONV Risk Score and Plan: 3 and Ondansetron, Dexamethasone, Propofol infusion, TIVA and Treatment may vary due to age or medical condition  Airway Management Planned: Simple Face Mask  Additional Equipment:   Intra-op Plan:   Post-operative Plan:   Informed Consent: I have reviewed the patients History and Physical, chart, labs and discussed the procedure including the risks, benefits and alternatives for the proposed anesthesia with the patient or authorized representative who has indicated his/her understanding and acceptance.     Dental advisory given  Plan Discussed with: CRNA  Anesthesia Plan Comments:         Anesthesia Quick  Evaluation

## 2019-07-19 NOTE — Plan of Care (Signed)
Plan of care 

## 2019-07-20 ENCOUNTER — Encounter: Payer: Self-pay | Admitting: *Deleted

## 2019-07-20 DIAGNOSIS — Z79899 Other long term (current) drug therapy: Secondary | ICD-10-CM | POA: Diagnosis not present

## 2019-07-20 DIAGNOSIS — I10 Essential (primary) hypertension: Secondary | ICD-10-CM | POA: Diagnosis not present

## 2019-07-20 DIAGNOSIS — M797 Fibromyalgia: Secondary | ICD-10-CM | POA: Diagnosis not present

## 2019-07-20 DIAGNOSIS — K219 Gastro-esophageal reflux disease without esophagitis: Secondary | ICD-10-CM | POA: Diagnosis not present

## 2019-07-20 DIAGNOSIS — M1712 Unilateral primary osteoarthritis, left knee: Secondary | ICD-10-CM | POA: Diagnosis not present

## 2019-07-20 DIAGNOSIS — Z20822 Contact with and (suspected) exposure to covid-19: Secondary | ICD-10-CM | POA: Diagnosis not present

## 2019-07-20 LAB — CBC
HCT: 34 % — ABNORMAL LOW (ref 36.0–46.0)
Hemoglobin: 11.1 g/dL — ABNORMAL LOW (ref 12.0–15.0)
MCH: 31.6 pg (ref 26.0–34.0)
MCHC: 32.6 g/dL (ref 30.0–36.0)
MCV: 96.9 fL (ref 80.0–100.0)
Platelets: 215 10*3/uL (ref 150–400)
RBC: 3.51 MIL/uL — ABNORMAL LOW (ref 3.87–5.11)
RDW: 13.1 % (ref 11.5–15.5)
WBC: 7 10*3/uL (ref 4.0–10.5)
nRBC: 0 % (ref 0.0–0.2)

## 2019-07-20 LAB — BASIC METABOLIC PANEL
Anion gap: 8 (ref 5–15)
BUN: 19 mg/dL (ref 8–23)
CO2: 27 mmol/L (ref 22–32)
Calcium: 8.5 mg/dL — ABNORMAL LOW (ref 8.9–10.3)
Chloride: 100 mmol/L (ref 98–111)
Creatinine, Ser: 0.76 mg/dL (ref 0.44–1.00)
GFR calc Af Amer: >60 mL/min (ref >60)
GFR calc non Af Amer: >60 mL/min (ref >60)
Glucose, Bld: 124 mg/dL — ABNORMAL HIGH (ref 70–99)
Potassium: 3.1 mmol/L — ABNORMAL LOW (ref 3.5–5.1)
Sodium: 135 mmol/L (ref 135–145)

## 2019-07-20 MED ORDER — ONDANSETRON HCL 4 MG PO TABS: 4.0000 mg | ORAL_TABLET | Freq: Four times a day (QID) | ORAL | 0 refills | Status: DC | PRN

## 2019-07-20 MED ORDER — HYDROCODONE-ACETAMINOPHEN 7.5-325 MG PO TABS: 1.0000 | ORAL_TABLET | ORAL | 0 refills | Status: AC | PRN

## 2019-07-20 MED ORDER — METHOCARBAMOL 500 MG PO TABS: 500.0000 mg | ORAL_TABLET | Freq: Four times a day (QID) | ORAL | 0 refills | Status: DC | PRN

## 2019-07-20 MED ORDER — ASPIRIN 325 MG PO TBEC: 325.0000 mg | DELAYED_RELEASE_TABLET | Freq: Two times a day (BID) | ORAL | 0 refills | Status: DC

## 2019-07-20 MED ORDER — TRAMADOL HCL 50 MG PO TABS: 50.0000 mg | ORAL_TABLET | Freq: Four times a day (QID) | ORAL | 0 refills | Status: DC | PRN

## 2019-07-20 MED ORDER — POTASSIUM CHLORIDE CRYS ER 20 MEQ PO TBCR
40.0000 meq | EXTENDED_RELEASE_TABLET | ORAL | Status: AC
  Administered 2019-07-20 (×2): 40 meq via ORAL
  Filled 2019-07-20 (×2): qty 2

## 2019-07-20 NOTE — Progress Notes (Signed)
Patient's Hydrocodone-acetaminophen 7.5 m-325mg  was counted with CN and sent to pharmacy.

## 2019-07-20 NOTE — Progress Notes (Signed)
Subjective: 1 Day Post-Op Procedure(s) (LRB): TOTAL KNEE ARTHROPLASTY (Left) Patient reports pain as mild.   Patient seen in rounds with Dr. Wynelle Link. Patient is well, and has had no acute complaints or problems except for some pain in the left knee as to be expected. No acute events overnight. No SOB or chest pain. Catheter to be removed this morning. Positive flatus. Walked 40 feet with PT yesterday.   Objective: Vital signs in last 24 hours: Temp:  [97.4 F (36.3 C)-98.5 F (36.9 C)] 98.5 F (36.9 C) (03/09 0545) Pulse Rate:  [62-86] 82 (03/09 0545) Resp:  [9-18] 17 (03/09 0545) BP: (122-181)/(62-85) 152/74 (03/09 0545) SpO2:  [95 %-100 %] 95 % (03/09 0545) Weight:  [74.5 kg] 74.5 kg (03/08 1308)  Intake/Output from previous day:  Intake/Output Summary (Last 24 hours) at 07/20/2019 0737 Last data filed at 07/20/2019 [id number] Gross per 24 hour  Intake 3564.7 ml  Output 3860 ml  Net -295.3 ml     Labs: Recent Labs    07/20/19 0303  HGB 11.1*   Recent Labs    07/20/19 0303  WBC 7.0  RBC 3.51*  HCT 34.0*  PLT 215   Recent Labs    07/20/19 0303  NA 135  K 3.1*  CL 100  CO2 27  BUN 19  CREATININE 0.76  GLUCOSE 124*  CALCIUM 8.5*    EXAM General - Patient is Alert and Oriented Extremity - Neurologically intact Intact pulses distally Dorsiflexion/Plantar flexion intact No cellulitis present Compartment soft Dressing - dressing C/D/I Motor Function - intact, moving foot and toes well on exam.  Hemovac pulled without difficulty.  Past Medical History:  Diagnosis Date  . Arthritis   . Back pain   . Complication of anesthesia   . Constipation, chronic   . DES exposure in utero   . Family history of adverse reaction to anesthesia    VOMITING  . Fibromyalgia    PT THINKS SHE HAS FIBROMYALGIA  . GERD (gastroesophageal reflux disease)   . 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 Hypertension   . Lichen  plano-pilaris   . PONV (postoperative nausea and vomiting)     Assessment/Plan: 1 Day Post-Op Procedure(s) (LRB): TOTAL KNEE ARTHROPLASTY (Left) Principal Problem:   OA (osteoarthritis) of knee Active Problems:   S/P total knee arthroplasty, left   Osteoarthritis of left knee  Estimated body mass index is 30.04 kg/m as calculated from the following:   Height as of this encounter: 5\' 2"  (1.575 m).   Weight as of this encounter: 74.5 kg. Advance diet Up with therapy D/C IV fluids and KVO when tolerating POs well  DVT Prophylaxis - Aspirin Weight-Bearing as tolerated  D/C O2 and Pulse OX and try on Room Air  Anticipated LOS equal to or greater than 2 midnights due to - Age 21 and older with one or more of the following:  - Obesity  - Expected need for hospital services (PT, OT, Nursing) required for safe  discharge  - Anticipated need for postoperative skilled nursing care or inpatient rehab  - Active co-morbidities: Chronic pain requiring opiods OR   - Unanticipated findings during/Post Surgery: None  - Patient is a high risk of re-admission due to: None    Patient will benefit from working with PT for two sessions today. If she is progressing and meeting her goals, plan for DC home today. Follow up in office in 2 weeks.  Discharge instructions given.   Ardeen Jourdain, PA-C Orthopaedic Surgery 07/20/2019, 7:37 AM

## 2019-07-20 NOTE — Plan of Care (Signed)
resolved 

## 2019-07-20 NOTE — TOC Initial Note (Signed)
Transition of Care [facility]) - Initial/Assessment Note    Patient Details  Name: Crystal Ramsey MRN: [id number] Date of Birth: January 20, 1936  Transition of Care [facility]) CM/SW Contact:    Leeroy Cha, RN Phone Number: 07/20/2019, 10:41 AM  Clinical Narrative:                 dcd to home with oop rehab  Expected Discharge Plan: OP Rehab Barriers to Discharge: No Barriers Identified   Patient Goals and CMS Choice Patient states their goals for this hospitalization and ongoing recovery are:: to go home CMS Medicare.gov Compare Post Acute Care list provided to:: Patient    Expected Discharge Plan and Services Expected Discharge Plan: OP Rehab       Living arrangements for the past 2 months: Single Family Home Expected Discharge Date: 07/20/19               DME Arranged: N/A DME Agency: NA       HH Arranged: NA [place] Agency: NA        Prior Living Arrangements/Services Living arrangements for the past 2 months: Single Family Home Lives with:: Spouse   Do you feel safe going back to the place where you live?: Yes      Need for Family Participation in Patient Care: Yes (Comment) Care giver support system in place?: Yes (comment)   Criminal Activity/Legal Involvement Pertinent to Current Situation/Hospitalization: No - Comment as needed  Activities of Daily Living Home Assistive Devices/Equipment: Eyeglasses, Environmental consultant (specify type), Cane (specify quad or straight), Grab bars in shower, Grab bars around toilet, Raised toilet seat with rails ADL Screening (condition at time of admission) Patient's cognitive ability adequate to safely complete daily activities?: No Is the patient deaf or have difficulty hearing?: No Does the patient have difficulty seeing, even when wearing glasses/contacts?: No Does the patient have difficulty concentrating, remembering, or making decisions?: No Patient able to express need for assistance with ADLs?: Yes Does the patient have difficulty  dressing or bathing?: No Independently performs ADLs?: Yes (appropriate for developmental age) Does the patient have difficulty walking or climbing stairs?: No Weakness of Legs: Left Weakness of Arms/Hands: None  Permission Sought/Granted                  Emotional Assessment Appearance:: Appears stated age     Orientation: : Oriented to Self, Oriented to Place, Oriented to  Time, Oriented to Situation Alcohol / Substance Use: Not Applicable Psych Involvement: No (comment)  Admission diagnosis:  S/P total knee arthroplasty, left [Z96.652] Osteoarthritis of left knee [M17.12] Patient Active Problem List   Diagnosis Date Noted  . S/P total knee arthroplasty, left 07/19/2019  . Osteoarthritis of left knee 07/19/2019  . OA (osteoarthritis) of hip 09/07/2014  . Postop Hyponatremia 05/26/2012  . Postop Hypokalemia 05/26/2012  . OA (osteoarthritis) of knee 05/25/2012  . Hypertension   . Lichen plano-pilaris   . Arthritis    PCP:  Lavone Orn, MD Pharmacy:   [facility] [place] [address] [place] [address] Phone: [phone] Fax: [phone]     Social Determinants of Health (SDOH) Interventions    Readmission Risk Interventions No flowsheet data found.

## 2019-07-20 NOTE — Progress Notes (Addendum)
Physical Therapy Treatment Patient Details Name: Crystal Ramsey MRN: [id number] DOB: 09-25-1935 Today's Date: 07/20/2019    History of Present Illness s/p L TKA; PMH: bil THA, TKA    PT Comments    Pt performed LE exercises in recliner and then ambulated short distance in hallway.  Pt reports no sleep last night and nausea this morning and requested return to bed end of session.    Follow Up Recommendations  Follow surgeon's recommendation for DC plan and follow-up therapies     Equipment Recommendations  None recommended by PT    Recommendations for Other Services       Precautions / Restrictions Precautions Precautions: Fall;Knee Precaution Comments: able to perform SLR Restrictions Other Position/Activity Restrictions: WBAT    Mobility  Bed Mobility Overal bed mobility: Needs Assistance Bed Mobility: Sit to Supine       Sit to supine: Min assist   General bed mobility comments: assist with LLE  Transfers Overall transfer level: Needs assistance Equipment used: Rolling walker (2 wheeled) Transfers: Sit to/from Stand Sit to Stand: Min assist         General transfer comment: verbal cues for UE and LE positioning, assist to rise and steady  Ambulation/Gait Ambulation/Gait assistance: Min guard;Min assist Gait Distance (Feet): 28 Feet Assistive device: Rolling walker (2 wheeled) Gait Pattern/deviations: Step-to pattern;Decreased stance time - left;Antalgic     General Gait Details: cues for sequence, posture, step length; distance limited by pain and fatigue   Stairs             Wheelchair Mobility    Modified Rankin (Stroke Patients Only)       Balance                                            Cognition Arousal/Alertness: Awake/alert Behavior During Therapy: WFL for tasks assessed/performed Overall Cognitive Status: Within Functional Limits for tasks assessed                                         Exercises Total Joint Exercises Ankle Circles/Pumps: AROM;Both;10 reps Quad Sets: AROM;Both;10 reps Short Arc QuadSinclair Ship;Left;10 reps Heel Slides: AAROM;Left;10 reps Hip ABduction/ADduction: AAROM;Left;10 reps Straight Leg Raises: AAROM;Left;10 reps    General Comments        Pertinent Vitals/Pain Pain Assessment: 0-10 Pain Score: 7  Pain Location: L knee Pain Descriptors / Indicators: Discomfort;Aching;Sore Pain Intervention(s): Limited activity within patient's tolerance;Monitored during session;Premedicated before session;Repositioned    Home Living                      Prior Function            PT Goals (current goals can now be found in the care plan section) Progress towards PT goals: Progressing toward goals    Frequency    7X/week      PT Plan Current plan remains appropriate    Co-evaluation              AM-PAC PT "6 Clicks" Mobility   Outcome Measure  Help needed turning from your back to your side while in a flat bed without using bedrails?: A Little Help needed moving from lying on your back to sitting on the side of a flat bed  without using bedrails?: A Little Help needed moving to and from a bed to a chair (including a wheelchair)?: A Little Help needed standing up from a chair using your arms (e.g., wheelchair or bedside chair)?: A Little Help needed to walk in hospital room?: A Little Help needed climbing 3-5 steps with a railing? : A Lot 6 Click Score: 17    End of Session Equipment Utilized During Treatment: Gait belt Activity Tolerance: Patient tolerated treatment well Patient left: in bed;with call bell/phone within reach   PT Visit Diagnosis: Difficulty in walking, not elsewhere classified (R26.2)     Time: [id number] PT Time Calculation (min) (ACUTE ONLY): 19 min  Charges: $Therapeutic Exercise: 8-22 mins                    Arlyce Dice, DPT Acute Rehabilitation Services Office: [phone]  Trena Platt 07/20/2019, 12:43 PM

## 2019-07-20 NOTE — Discharge Summary (Signed)
Physician Discharge Summary   Patient ID: Crystal Ramsey MRN: [id number] DOB/AGE: 82-Feb-1937 82 y.o.  Admit date: 07/19/2019 Discharge date:  07/20/19  Primary Diagnosis: Primary osteoarthritis left knee  Admission Diagnoses:  Past Medical History:  Diagnosis Date  . Arthritis   . Back pain   . Complication of anesthesia   . Constipation, chronic   . DES exposure in utero   . Family history of adverse reaction to anesthesia    VOMITING  . Fibromyalgia    PT THINKS SHE HAS FIBROMYALGIA  . GERD (gastroesophageal reflux disease)   . 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 Hypertension   . Lichen plano-pilaris   . PONV (postoperative nausea and vomiting)    Discharge Diagnoses:   Principal Problem:   OA (osteoarthritis) of knee Active Problems:   S/P total knee arthroplasty, left   Osteoarthritis of left knee  Estimated body mass index is 30.04 kg/m as calculated from the following:   Height as of this encounter: 5\' 2"  (1.575 m).   Weight as of this encounter: 74.5 kg.  Procedure:  Procedure(s) (LRB): TOTAL KNEE ARTHROPLASTY (Left)   Consults: None  HPI: Crystal Ramsey, 84 y.o. female has a history of pain and functional disability in the left knee due to arthritis and has failed non-surgical conservative treatments for greater than 12 weeks to include corticosteriod injections and activity modification. Onset of symptoms was gradual, starting several years ago with gradually worsening course since that time. The patient noted no past surgery on the left knee.  Patient currently rates pain in the left knee at 4 out of 10 with activity. Patient has worsening of pain with activity and weight bearing, pain that interferes with activities of daily living, joint swelling and instability. Patient has evidence of tri-compartmental bone-on-bone changes by imaging studies. There is no active infection.  Laboratory Data: Admission on  07/19/2019  Component Date Value Ref Range Status  . WBC 07/20/2019 7.0  4.0 - 10.5 K/uL Final  . RBC 07/20/2019 3.51* 3.87 - 5.11 MIL/uL Final  . Hemoglobin 07/20/2019 11.1* 12.0 - 15.0 g/dL Final  . HCT 07/20/2019 34.0* 36.0 - 46.0 % Final  . MCV 07/20/2019 96.9  80.0 - 100.0 fL Final  . MCH 07/20/2019 31.6  26.0 - 34.0 pg Final  . MCHC 07/20/2019 32.6  30.0 - 36.0 g/dL Final  . RDW 07/20/2019 13.1  11.5 - 15.5 % Final  . Platelets 07/20/2019 215  150 - 400 K/uL Final  . nRBC 07/20/2019 0.0  0.0 - 0.2 % Final   Performed at [facility], [place] [address] [place], [place] [address]  . Sodium 07/20/2019 135  135 - 145 mmol/L Final  . Potassium 07/20/2019 3.1* 3.5 - 5.1 mmol/L Final  . Chloride 07/20/2019 100  98 - 111 mmol/L Final  . CO2 07/20/2019 27  22 - 32 mmol/L Final  . Glucose, Bld 07/20/2019 124* 70 - 99 mg/dL Final   Glucose reference range applies only to samples taken after fasting for at least 8 hours.  . BUN 07/20/2019 19  8 - 23 mg/dL Final  . Creatinine, Ser 07/20/2019 0.76  0.44 - 1.00 mg/dL Final  . Calcium 07/20/2019 8.5* 8.9 - 10.3 mg/dL Final  . GFR calc non Af Amer 07/20/2019 >60  >60 mL/min Final  . GFR calc Af Amer 07/20/2019 >60  >60 mL/min Final  . Anion gap 07/20/2019 8  5 - 15 Final   Performed at [facility], [place] [address], [place], [place] [address]  Hospital Outpatient Visit on 07/15/2019  Component Date Value Ref Range Status  . SARS Coronavirus 2 07/15/2019 NEGATIVE  NEGATIVE Final   Comment: (NOTE) SARS-CoV-2 target nucleic acids are NOT DETECTED. The SARS-CoV-2 RNA is generally detectable in upper and lower respiratory specimens during the acute phase of infection. Negative results do not preclude SARS-CoV-2 infection, do not rule out co-infections with other pathogens, and should not be used as the sole basis for treatment or other patient management decisions. Negative results must be combined with clinical  observations, patient history, and epidemiological information. The expected result is Negative. Fact Sheet for Patients: SugarRoll.be Fact Sheet for Healthcare Providers: https://www.woods-mathews.com/ This test is not yet approved or cleared by the [place] FDA and  has been authorized for detection and/or diagnosis of SARS-CoV-2 by FDA under an Emergency Use Authorization (EUA). This EUA will remain  in effect (meaning this test can be used) for the duration of the COVID-19 declaration under Section 56                          4(b)(1) of the Act, 21 U.S.C. section 360bbb-3(b)(1), unless the authorization is terminated or revoked sooner. Performed at [facility], [place] [address] [place], [place] [address]   Hospital Outpatient Visit on 07/12/2019  Component Date Value Ref Range Status  . aPTT 07/12/2019 35  24 - 36 seconds Final   Performed at [facility], [place] [address] [place], [place] [address]  . WBC 07/12/2019 3.6* 4.0 - 10.5 K/uL Final  . RBC 07/12/2019 3.81* 3.87 - 5.11 MIL/uL Final  . Hemoglobin 07/12/2019 12.3  12.0 - 15.0 g/dL Final  . HCT 07/12/2019 36.7  36.0 - 46.0 % Final  . MCV 07/12/2019 96.3  80.0 - 100.0 fL Final  . MCH 07/12/2019 32.3  26.0 - 34.0 pg Final  . MCHC 07/12/2019 33.5  30.0 - 36.0 g/dL Final  . RDW 07/12/2019 13.1  11.5 - 15.5 % Final  . Platelets 07/12/2019 219  150 - 400 K/uL Final  . nRBC 07/12/2019 0.0  0.0 - 0.2 % Final   Performed at [facility], [place] [address], [place],  [address]  . Sodium 07/12/2019 133* 135 - 145 mmol/L Final  . Potassium 07/12/2019 3.1* 3.5 - 5.1 mmol/L Final  . Chloride 07/12/2019 95* 98 - 111 mmol/L Final  . CO2 07/12/2019 29  22 - 32 mmol/L Final  . Glucose, Bld 07/12/2019 93  70 - 99 mg/dL Final   Glucose reference range applies only to samples taken after fasting for at least 8 hours.  . BUN 07/12/2019 20  8 - 23  mg/dL Final  . Creatinine, Ser 07/12/2019 0.76  0.44 - 1.00 mg/dL Final  . Calcium 07/12/2019 9.1  8.9 - 10.3 mg/dL Final  . Total Protein 07/12/2019 7.1  6.5 - 8.1 g/dL Final  . Albumin 07/12/2019 3.8  3.5 - 5.0 g/dL Final  . AST 07/12/2019 23  15 - 41 U/L Final  . ALT 07/12/2019 21  0 - 44 U/L Final  . Alkaline Phosphatase 07/12/2019 70  38 - 126 U/L Final  . Total Bilirubin 07/12/2019 0.4  0.3 - 1.2 mg/dL Final  . GFR calc non Af Amer 07/12/2019 >60  >60 mL/min Final  . GFR calc Af Amer 07/12/2019 >60  >  60 mL/min Final  . Anion gap 07/12/2019 9  5 - 15 Final   Performed at [facility], [place] [address] [place], [place] [address]  . Prothrombin Time 07/12/2019 12.7  11.4 - 15.2 seconds Final  . INR 07/12/2019 1.0  0.8 - 1.2 Final   Comment: (NOTE) INR goal varies based on device and disease states. Performed at [facility], [place] [address], [place], [place] [address]   . ABO/RH(D) 07/12/2019 O POS   Final  . Antibody Screen 07/12/2019 NEG   Final  . Sample Expiration 07/12/2019 07/22/2019,2359   Final  . Extend sample reason 07/12/2019    Final                   Value:NO TRANSFUSIONS OR PREGNANCY IN THE PAST 3 MONTHS Performed at [facility], [place] [address], [place], [place] [address]   . MRSA, PCR 07/12/2019 NEGATIVE  NEGATIVE Final  . Staphylococcus aureus 07/12/2019 NEGATIVE  NEGATIVE Final   Comment: (NOTE) The Xpert SA Assay (FDA approved for NASAL specimens in patients 76 years of age and older), is one component of a comprehensive surveillance program. It is not intended to diagnose infection nor to guide or monitor treatment. Performed at [facility], [place] [address] [place], [place] [address]   . ABO/RH(D) 07/12/2019    Final                   Value:O POS Performed at [facility], [place] [address], [place], [place] [address]       Hospital Course: Crystal Ramsey is  a 84 y.o. who was admitted to [facility] They were brought to the operating room on 07/19/2019 and underwent Procedure(s): TOTAL KNEE ARTHROPLASTY.  Patient tolerated the procedure well and was later transferred to the recovery room and then to the orthopaedic floor for postoperative care.  They were given PO and IV analgesics for pain control following their surgery.  They were given 24 hours of postoperative antibiotics of  Anti-infectives (From admission, onward)    Start     Dose/Rate Route Frequency Ordered Stop   07/19/19 1700  vancomycin (VANCOCIN) IVPB 1000 mg/200 mL premix     1,000 mg 200 mL/hr over 60 Minutes Intravenous Every 12 hours 07/19/19 1254 07/19/19 1912   07/19/19 1000  ceFAZolin (ANCEF) IVPB 2g/100 mL premix     2 g 200 mL/hr over 30 Minutes Intravenous  Once 07/19/19 0949 07/19/19 1045   07/19/19 0830  ceFAZolin (ANCEF) 2-4 GM/100ML-% IVPB    Note to Pharmacy: Randa Evens  : cabinet override      07/19/19 0830 07/19/19 1046   07/19/19 0815  vancomycin (VANCOCIN) IVPB 1000 mg/200 mL premix  Status:  Discontinued     1,000 mg 200 mL/hr over 60 Minutes Intravenous On call to O.R. 07/19/19 [id number] 07/19/19 0948      and started on DVT prophylaxis in the form of Aspirin.   PT and OT were ordered for total joint protocol.  Discharge planning consulted to help with postop disposition and equipment needs.  Patient had a good night on the evening of surgery.  They started to get up OOB with therapy on day one. Hemovac drain was pulled without difficulty.  The patient had progressed with therapy and meeting their goals.  Incision was healing well.  Patient was seen in rounds and was ready to go home.   Diet: Cardiac  diet Activity:WBAT Follow-up:in 2 weeks Disposition - Home Discharged Condition: stable   Discharge Instructions     Call MD / Call 911   Complete by: As directed    If you experience chest pain or shortness of breath, CALL 911 and be transported  to the hospital emergency room.  If you develope a fever above 101 F, pus (white drainage) or increased drainage or redness at the wound, or calf pain, call your surgeon's office.   Constipation Prevention   Complete by: As directed    Drink plenty of fluids.  Prune juice may be helpful.  You may use a stool softener, such as Colace (over the counter) 100 mg twice a day.  Use MiraLax (over the counter) for constipation as needed.   Diet - low sodium heart healthy   Complete by: As directed    Discharge instructions   Complete by: As directed    Gaynelle Arabian, MD Total Joint Specialist [facility] [place] [address], [address] [place], [place] [address] [phone]  TOTAL KNEE REPLACEMENT POSTOPERATIVE DIRECTIONS    Knee Rehabilitation, Guidelines Following Surgery  Results after knee surgery are often greatly improved when you follow the exercise, range of motion and muscle strengthening exercises prescribed by your doctor. Safety measures are also important to protect the knee from further injury. If any of these exercises cause you to have increased pain or swelling in your knee joint, decrease the amount until you are comfortable again and slowly increase them. If you have problems or questions, call your caregiver or physical therapist for advice.   BLOOD CLOT PREVENTION Take a 325 mg Aspirin two times a day for three weeks following surgery. Then take an 81 mg Aspirin once a day for three weeks. Then discontinue Aspirin. You may resume your vitamins/supplements upon discharge from the hospital. Do not take any NSAIDs (Advil, Aleve, Ibuprofen, Meloxicam, etc.) until you have discontinued the 325 mg Aspirin.  HOME CARE INSTRUCTIONS  Remove items at home which could result in a fall. This includes throw rugs or furniture in walking pathways.  ICE to the affected knee as much as tolerated. Icing helps control swelling. If the swelling is well controlled you will be more  comfortable and rehab easier. Continue to use ice on the knee for pain and swelling from surgery. You may notice swelling that will progress down to the foot and ankle. This is normal after surgery. Elevate the leg when you are not up walking on it.    Continue to use the breathing machine which will help keep your temperature down. It is common for your temperature to cycle up and down following surgery, especially at night when you are not up moving around and exerting yourself. The breathing machine keeps your lungs expanded and your temperature down. Do not place pillow under the operative knee, focus on keeping the knee straight while resting  DIET You may resume your previous home diet once you are discharged from the hospital.  DRESSING / WOUND CARE / SHOWERING Keep your bulky bandage on for 2 days. On the third post-operative day you may remove the Ace bandage and gauze. There is a waterproof adhesive bandage on your skin which will stay in place until your first follow-up appointment. Once you remove this you will not need to place another bandage You may begin showering 3 days following surgery, but do not submerge the incision under water.  ACTIVITY For the first 5 days, the key is rest  and control of pain and swelling Do your home exercises twice a day starting on post-operative day 3. On the days you go to physical therapy, just do the home exercises once that day. You should rest, ice and elevate the leg for 50 minutes out of every hour. Get up and walk/stretch for 10 minutes per hour. After 5 days you can increase your activity slowly as tolerated. Walk with your walker as instructed. Use the walker until you are comfortable transitioning to a cane. Walk with the cane in the opposite hand of the operative leg. You may discontinue the cane once you are comfortable and walking steadily. Avoid periods of inactivity such as sitting longer than an hour when not asleep. This helps prevent  blood clots.  You may discontinue the knee immobilizer once you are able to perform a straight leg raise while lying down. You may resume a sexual relationship in one month or when given the OK by your doctor.  You may return to work once you are cleared by your doctor.  Do not drive a car for 6 weeks or until released by you surgeon.  Do not drive while taking narcotics.  TED HOSE STOCKINGS Wear the elastic stockings on both legs for three weeks following surgery during the day. You may remove them at night for sleeping.  WEIGHT BEARING Weight bearing as tolerated with assist device (walker, cane, etc) as directed, use it as long as suggested by your surgeon or therapist, typically at least 4-6 weeks.  POSTOPERATIVE CONSTIPATION PROTOCOL Constipation - defined medically as fewer than three stools per week and severe constipation as less than one stool per week.  One of the most common issues patients have following surgery is constipation.  Even if you have a regular bowel pattern at home, your normal regimen is likely to be disrupted due to multiple reasons following surgery.  Combination of anesthesia, postoperative narcotics, change in appetite and fluid intake all can affect your bowels.  In order to avoid complications following surgery, here are some recommendations in order to help you during your recovery period.  Colace (docusate) - Pick up an over-the-counter form of Colace or another stool softener and take twice a day as long as you are requiring postoperative pain medications.  Take with a full glass of water daily.  If you experience loose stools or diarrhea, hold the colace until you stool forms back up. If your symptoms do not get better within 1 week or if they get worse, check with your doctor. Dulcolax (bisacodyl) - Pick up over-the-counter and take as directed by the product packaging as needed to assist with the movement of your bowels.  Take with a full glass of water.  Use  this product as needed if not relieved by Colace only.  MiraLax (polyethylene glycol) - Pick up over-the-counter to have on hand. MiraLax is a solution that will increase the amount of water in your bowels to assist with bowel movements.  Take as directed and can mix with a glass of water, juice, soda, coffee, or tea. Take if you go more than two days without a movement. Do not use MiraLax more than once per day. Call your doctor if you are still constipated or irregular after using this medication for 7 days in a row.  If you continue to have problems with postoperative constipation, please contact the office for further assistance and recommendations.  If you experience "the worst abdominal pain ever" or develop nausea or  vomiting, please contact the office immediatly for further recommendations for treatment.  ITCHING If you experience itching with your medications, try taking only a single pain pill, or even half a pain pill at a time.  You can also use Benadryl over the counter for itching or also to help with sleep.   MEDICATIONS See your medication summary on the "After Visit Summary" that the nursing staff will review with you prior to discharge.  You may have some home medications which will be placed on hold until you complete the course of blood thinner medication.  It is important for you to complete the blood thinner medication as prescribed by your surgeon.  Continue your approved medications as instructed at time of discharge.  PRECAUTIONS If you experience chest pain or shortness of breath - call 911 immediately for transfer to the hospital emergency department.  If you develop a fever greater that 101 F, purulent drainage from wound, increased redness or drainage from wound, foul odor from the wound/dressing, or calf pain - CONTACT YOUR SURGEON.                                                   FOLLOW-UP APPOINTMENTS Make sure you keep all of your appointments after your operation  with your surgeon and caregivers. You should call the office at the above phone number and make an appointment for approximately two weeks after the date of your surgery or on the date instructed by your surgeon outlined in the "After Visit Summary".  RANGE OF MOTION AND STRENGTHENING EXERCISES  Rehabilitation of the knee is important following a knee injury or an operation. After just a few days of immobilization, the muscles of the thigh which control the knee become weakened and shrink (atrophy). Knee exercises are designed to build up the tone and strength of the thigh muscles and to improve knee motion. Often times heat used for twenty to thirty minutes before working out will loosen up your tissues and help with improving the range of motion but do not use heat for the first two weeks following surgery. These exercises can be done on a training (exercise) mat, on the floor, on a table or on a bed. Use what ever works the best and is most comfortable for you Knee exercises include:  Leg Lifts - While your knee is still immobilized in a splint or cast, you can do straight leg raises. Lift the leg to 60 degrees, hold for 3 sec, and slowly lower the leg. Repeat 10-20 times 2-3 times daily. Perform this exercise against resistance later as your knee gets better.  Quad and Hamstring Sets - Tighten up the muscle on the front of the thigh (Quad) and hold for 5-10 sec. Repeat this 10-20 times hourly. Hamstring sets are done by pushing the foot backward against an object and holding for 5-10 sec. Repeat as with quad sets.  Leg Slides: Lying on your back, slowly slide your foot toward your buttocks, bending your knee up off the floor (only go as far as is comfortable). Then slowly slide your foot back down until your leg is flat on the floor again. Angel Wings: Lying on your back spread your legs to the side as far apart as you can without causing discomfort.  A rehabilitation program following serious knee  injuries can speed recovery  and prevent re-injury in the future due to weakened muscles. Contact your doctor or a physical therapist for more information on knee rehabilitation.   IF YOU ARE TRANSFERRED TO A SKILLED REHAB FACILITY If the patient is transferred to a skilled rehab facility following release from the hospital, a list of the current medications will be sent to the facility for the patient to continue.  When discharged from the skilled rehab facility, please have the facility set up the patient's [place] prior to being released. Also, the skilled facility will be responsible for providing the patient with their medications at time of release from the facility to include their pain medication, the muscle relaxants, and their blood thinner medication. If the patient is still at the rehab facility at time of the two week follow up appointment, the skilled rehab facility will also need to assist the patient in arranging follow up appointment in our office and any transportation needs.  MAKE SURE YOU:  Understand these instructions.  Get help right away if you are not doing well or get worse.    Pick up stool softner and laxative for home use following surgery while on pain medications. Do not submerge incision under water. Please use good hand washing techniques while changing dressing each day. May shower starting three days after surgery. Please use a clean towel to pat the incision dry following showers. Continue to use ice for pain and swelling after surgery. Do not use any lotions or creams on the incision until instructed by your surgeon.   Increase activity slowly as tolerated   Complete by: As directed       Allergies as of 07/20/2019       Reactions   Other Other (See Comments)   CONTRAST DYE-unknown   Demerol [meperidine] Itching, Nausea And Vomiting   Dilaudid [hydromorphone Hcl] Itching, Nausea And Vomiting   Macrodantin [nitrofurantoin] Other (See  Comments)   unknown   Sulfa Antibiotics Other (See Comments)   unknown   Zithromax [azithromycin] Other (See Comments)   flu   Chlorhexidine Gluconate Rash   Penicillins Other (See Comments)   Has patient had a PCN reaction causing immediate rash, facial/tongue/throat swelling, SOB or lightheadedness with hypotension: NO Has patient had a PCN reaction causing severe rash involving mucus membranes or skin necrosis: unknown Has patient had a PCN reaction that required hospitalization: unknown Has patient had a PCN reaction occurring within the last 10 years: No If all of the above answers are "NO", then may proceed with Cephalosporin use. Unknown Think it was vomiting, NO anaphylaxis        Medication List     STOP taking these medications    ciprofloxacin 250 MG tablet Commonly known as: CIPRO   clobetasol cream 0.05 % Commonly known as: TEMOVATE       TAKE these medications    amiloride-hydrochlorothiazide 5-50 MG tablet Commonly known as: MODURETIC Take 0.75 tablets by mouth daily. Takes 3/4  tablet   aspirin 325 MG EC tablet Take 1 tablet (325 mg total) by mouth 2 (two) times daily. For three weeks then transition to taking aspirin 81mg  daily for three additional weeks   calcium carbonate 500 MG chewable tablet Commonly known as: TUMS - dosed in mg elemental calcium Chew 2 tablets by mouth 3 (three) times daily as needed for indigestion or heartburn.   HYDROcodone-acetaminophen 7.5-325 MG tablet Commonly known as: NORCO Take 1-2 tablets by mouth every 4 (four) hours as needed for  severe pain. What changed: reasons to take this   methocarbamol 500 MG tablet Commonly known as: ROBAXIN Take 1 tablet (500 mg total) by mouth every 6 (six) hours as needed for muscle spasms.   multivitamin with minerals Tabs tablet Take 1 tablet by mouth daily.   omeprazole 20 MG tablet Commonly known as: PRILOSEC OTC Take 20 mg by mouth daily as needed (acid reflux).     ondansetron 4 MG tablet Commonly known as: ZOFRAN Take 1 tablet (4 mg total) by mouth every 6 (six) hours as needed for nausea.   potassium chloride 10 MEQ tablet Commonly known as: KLOR-CON Take 10 mEq by mouth 2 (two) times daily.   traMADol 50 MG tablet Commonly known as: ULTRAM Take 1-2 tablets (50-100 mg total) by mouth every 6 (six) hours as needed for moderate pain.       Follow-up Information     Gaynelle Arabian, MD. Go on 08/03/2019.   Specialty: Orthopedic Surgery Why: You are scheduled for a post-operative appointment on 08-03-19 at 1:15 pm.  Contact information: [address] [place] [address] [id number]         Rosilyn Mings.. Go on 07/22/2019.   Why: You are scheduled for a physical therapy appointment on 07-22-19 at 3:15 pm.  Contact information: [place] [address] [id number]            Signed: Ardeen Jourdain, PA-C Orthopaedic Surgery 07/20/2019, 7:40 AM

## 2019-07-20 NOTE — Progress Notes (Addendum)
Physical Therapy Treatment Patient Details Name: Crystal Ramsey MRN: [id number] DOB: 02-24-1936 Today's Date: 07/20/2019    History of Present Illness s/p L TKA; PMH: bil THA, TKA    PT Comments    Pt min/guard for mobility at this time and ambulated 40 feet in hallway and practiced safe stair technique.  Pt provided with HEP handout.  Pt hesitant about going home initially however after session states she feels ready for d/c today.   Follow Up Recommendations  Follow surgeon's recommendation for DC plan and follow-up therapies     Equipment Recommendations  None recommended by PT    Recommendations for Other Services       Precautions / Restrictions Precautions Precautions: Fall;Knee Precaution Comments: able to perform SLR Restrictions Other Position/Activity Restrictions: WBAT    Mobility  Bed Mobility Overal bed mobility: Needs Assistance Bed Mobility: Supine to Sit;Sit to Supine     Supine to sit: Supervision Sit to supine: Supervision   General bed mobility comments: pt utilized gait belt as strap to self assist, cues provided  Transfers Overall transfer level: Needs assistance Equipment used: Rolling walker (2 wheeled) Transfers: Sit to/from Stand Sit to Stand: Min guard         General transfer comment: verbal cues for UE and LE positioning  Ambulation/Gait Ambulation/Gait assistance: Min guard Gait Distance (Feet): 40 Feet Assistive device: Rolling walker (2 wheeled) Gait Pattern/deviations: Step-to pattern;Decreased stance time - left;Antalgic     General Gait Details: cues for sequence, posture, step length   Stairs Stairs: Yes Stairs assistance: Min guard Stair Management: Step to pattern;Forwards;Two rails Number of Stairs: 2 General stair comments: verbal cues for safety and sequence, pt reports she has 2 rails she can reach, performed once and felt comfortable with performance, declined practicing again   Wheelchair Mobility     Modified Rankin (Stroke Patients Only)       Balance                                            Cognition Arousal/Alertness: Awake/alert Behavior During Therapy: WFL for tasks assessed/performed Overall Cognitive Status: Within Functional Limits for tasks assessed                                        Exercises    General Comments        Pertinent Vitals/Pain Pain Assessment: 0-10 Pain Score: 6  Pain Location: L knee Pain Descriptors / Indicators: Discomfort;Aching;Sore Pain Intervention(s): Monitored during session;Repositioned    Home Living                      Prior Function            PT Goals (current goals can now be found in the care plan section) Progress towards PT goals: Progressing toward goals    Frequency    7X/week      PT Plan Current plan remains appropriate    Co-evaluation              AM-PAC PT "6 Clicks" Mobility   Outcome Measure  Help needed turning from your back to your side while in a flat bed without using bedrails?: A Little Help needed moving from lying on your back to sitting on  the side of a flat bed without using bedrails?: A Little Help needed moving to and from a bed to a chair (including a wheelchair)?: A Little Help needed standing up from a chair using your arms (e.g., wheelchair or bedside chair)?: A Little Help needed to walk in hospital room?: A Little Help needed climbing 3-5 steps with a railing? : A Little 6 Click Score: 18    End of Session Equipment Utilized During Treatment: Gait belt Activity Tolerance: Patient tolerated treatment well Patient left: in bed;with call bell/phone within reach   PT Visit Diagnosis: Difficulty in walking, not elsewhere classified (R26.2)     Time: 1446-1500 PT Time Calculation (min) (ACUTE ONLY): 14 min  Charges:  $Gait Training: 8-22 mins                     Arlyce Dice, DPT Acute Rehabilitation Services Office:  [place] 07/20/2019, 3:32 PM

## 2019-07-20 NOTE — Progress Notes (Signed)
meds stored in pharmacy returned to patient. Bethann Punches RN

## 2019-07-20 NOTE — Progress Notes (Addendum)
Patient informed the writer that she took her own Hydrocodone-acetaminophen   7.5-325 (1/2 tablet)  At 00:45am . Patient never called nurse for pain at this time.Patient was educated on taking her own medication in the hospital. Patient refusing to gave to nurse. Patient was educated to keep away and do not use anymore without consulting the nurse.

## 2019-07-22 DIAGNOSIS — M25562 Pain in left knee: Secondary | ICD-10-CM | POA: Diagnosis not present

## 2019-07-27 DIAGNOSIS — M25562 Pain in left knee: Secondary | ICD-10-CM | POA: Diagnosis not present

## 2019-07-30 DIAGNOSIS — M25562 Pain in left knee: Secondary | ICD-10-CM | POA: Diagnosis not present

## 2019-08-03 DIAGNOSIS — M25562 Pain in left knee: Secondary | ICD-10-CM | POA: Diagnosis not present

## 2019-08-06 DIAGNOSIS — M25562 Pain in left knee: Secondary | ICD-10-CM | POA: Diagnosis not present

## 2019-08-10 DIAGNOSIS — M25562 Pain in left knee: Secondary | ICD-10-CM | POA: Diagnosis not present

## 2019-08-12 DIAGNOSIS — M25562 Pain in left knee: Secondary | ICD-10-CM | POA: Diagnosis not present

## 2019-08-17 DIAGNOSIS — M25562 Pain in left knee: Secondary | ICD-10-CM | POA: Diagnosis not present

## 2019-08-19 DIAGNOSIS — M25562 Pain in left knee: Secondary | ICD-10-CM | POA: Diagnosis not present

## 2019-08-23 DIAGNOSIS — M25562 Pain in left knee: Secondary | ICD-10-CM | POA: Diagnosis not present

## 2019-08-24 DIAGNOSIS — M25562 Pain in left knee: Secondary | ICD-10-CM | POA: Diagnosis not present

## 2019-08-24 DIAGNOSIS — Z96652 Presence of left artificial knee joint: Secondary | ICD-10-CM | POA: Diagnosis not present

## 2019-08-27 DIAGNOSIS — M25562 Pain in left knee: Secondary | ICD-10-CM | POA: Diagnosis not present

## 2019-08-31 DIAGNOSIS — M25562 Pain in left knee: Secondary | ICD-10-CM | POA: Diagnosis not present

## 2019-09-03 DIAGNOSIS — M25562 Pain in left knee: Secondary | ICD-10-CM | POA: Diagnosis not present

## 2019-09-14 DIAGNOSIS — G894 Chronic pain syndrome: Secondary | ICD-10-CM | POA: Diagnosis not present

## 2019-09-14 DIAGNOSIS — Z79891 Long term (current) use of opiate analgesic: Secondary | ICD-10-CM | POA: Diagnosis not present

## 2019-09-14 DIAGNOSIS — M47816 Spondylosis without myelopathy or radiculopathy, lumbar region: Secondary | ICD-10-CM | POA: Diagnosis not present

## 2019-09-14 DIAGNOSIS — M150 Primary generalized (osteo)arthritis: Secondary | ICD-10-CM | POA: Diagnosis not present

## 2019-10-13 DIAGNOSIS — H01115 Allergic dermatitis of left lower eyelid: Secondary | ICD-10-CM | POA: Diagnosis not present

## 2019-10-13 DIAGNOSIS — H01114 Allergic dermatitis of left upper eyelid: Secondary | ICD-10-CM | POA: Diagnosis not present

## 2019-10-13 DIAGNOSIS — H01112 Allergic dermatitis of right lower eyelid: Secondary | ICD-10-CM | POA: Diagnosis not present

## 2019-10-13 DIAGNOSIS — H01111 Allergic dermatitis of right upper eyelid: Secondary | ICD-10-CM | POA: Diagnosis not present

## 2019-10-29 DIAGNOSIS — M4696 Unspecified inflammatory spondylopathy, lumbar region: Secondary | ICD-10-CM | POA: Diagnosis not present

## 2019-10-29 DIAGNOSIS — M179 Osteoarthritis of knee, unspecified: Secondary | ICD-10-CM | POA: Diagnosis not present

## 2019-10-29 DIAGNOSIS — I10 Essential (primary) hypertension: Secondary | ICD-10-CM | POA: Diagnosis not present

## 2019-10-29 DIAGNOSIS — Z0000 Encounter for general adult medical examination without abnormal findings: Secondary | ICD-10-CM | POA: Diagnosis not present

## 2019-10-29 DIAGNOSIS — Z1389 Encounter for screening for other disorder: Secondary | ICD-10-CM | POA: Diagnosis not present

## 2019-10-29 DIAGNOSIS — K219 Gastro-esophageal reflux disease without esophagitis: Secondary | ICD-10-CM | POA: Diagnosis not present

## 2020-01-20 DIAGNOSIS — M150 Primary generalized (osteo)arthritis: Secondary | ICD-10-CM | POA: Diagnosis not present

## 2020-01-20 DIAGNOSIS — M47816 Spondylosis without myelopathy or radiculopathy, lumbar region: Secondary | ICD-10-CM | POA: Diagnosis not present

## 2020-01-20 DIAGNOSIS — G894 Chronic pain syndrome: Secondary | ICD-10-CM | POA: Diagnosis not present

## 2020-01-20 DIAGNOSIS — Z79891 Long term (current) use of opiate analgesic: Secondary | ICD-10-CM | POA: Diagnosis not present

## 2020-02-08 DIAGNOSIS — Z23 Encounter for immunization: Secondary | ICD-10-CM | POA: Diagnosis not present

## 2020-02-23 DIAGNOSIS — Z23 Encounter for immunization: Secondary | ICD-10-CM | POA: Diagnosis not present

## 2020-02-29 ENCOUNTER — Encounter: Payer: Medicare Other | Admitting: Nurse Practitioner

## 2020-03-07 ENCOUNTER — Encounter: Payer: Medicare Other | Admitting: Obstetrics and Gynecology

## 2020-03-09 ENCOUNTER — Encounter: Payer: Self-pay | Admitting: Obstetrics and Gynecology

## 2020-03-09 DIAGNOSIS — Z1231 Encounter for screening mammogram for malignant neoplasm of breast: Secondary | ICD-10-CM | POA: Diagnosis not present

## 2020-03-20 DIAGNOSIS — M150 Primary generalized (osteo)arthritis: Secondary | ICD-10-CM | POA: Diagnosis not present

## 2020-03-20 DIAGNOSIS — Z79891 Long term (current) use of opiate analgesic: Secondary | ICD-10-CM | POA: Diagnosis not present

## 2020-03-20 DIAGNOSIS — G894 Chronic pain syndrome: Secondary | ICD-10-CM | POA: Diagnosis not present

## 2020-03-20 DIAGNOSIS — M47816 Spondylosis without myelopathy or radiculopathy, lumbar region: Secondary | ICD-10-CM | POA: Diagnosis not present

## 2020-03-21 ENCOUNTER — Ambulatory Visit (INDEPENDENT_AMBULATORY_CARE_PROVIDER_SITE_OTHER): Payer: Medicare Other | Admitting: Obstetrics and Gynecology

## 2020-03-21 ENCOUNTER — Encounter: Payer: Self-pay | Admitting: Obstetrics and Gynecology

## 2020-03-21 ENCOUNTER — Other Ambulatory Visit: Payer: Self-pay

## 2020-03-21 VITALS — BP 126/80 | Ht 61.0 in | Wt 161.0 lb

## 2020-03-21 DIAGNOSIS — Z01419 Encounter for gynecological examination (general) (routine) without abnormal findings: Secondary | ICD-10-CM | POA: Diagnosis not present

## 2020-03-21 DIAGNOSIS — Z9189 Other specified personal risk factors, not elsewhere classified: Secondary | ICD-10-CM | POA: Diagnosis not present

## 2020-03-21 DIAGNOSIS — Z1272 Encounter for screening for malignant neoplasm of vagina: Secondary | ICD-10-CM | POA: Diagnosis not present

## 2020-03-21 DIAGNOSIS — N390 Urinary tract infection, site not specified: Secondary | ICD-10-CM

## 2020-03-21 DIAGNOSIS — N763 Subacute and chronic vulvitis: Secondary | ICD-10-CM

## 2020-03-21 MED ORDER — CIPROFLOXACIN HCL 250 MG PO TABS: 250.0000 mg | ORAL_TABLET | Freq: Two times a day (BID) | ORAL | 0 refills | Status: DC

## 2020-03-21 MED ORDER — CLOBETASOL PROPIONATE 0.05 % EX CREA: 1.0000 "application " | TOPICAL_CREAM | Freq: Two times a day (BID) | CUTANEOUS | 1 refills | Status: DC | PRN

## 2020-03-21 NOTE — Progress Notes (Signed)
Crystal Ramsey 1935-07-14 [id number]  SUBJECTIVE:  84 y.o. G2P1011 female here for a breast and pelvic exam and Pap smear. She has no gynecologic concerns.  Current Outpatient Medications  Medication Sig Dispense Refill  . amiloride-hydrochlorothiazide (MODURETIC) 5-50 MG tablet Take 0.75 tablets by mouth daily. Takes 3/4  tablet    . calcium carbonate (TUMS - DOSED IN MG ELEMENTAL CALCIUM) 500 MG chewable tablet Chew 2 tablets by mouth 3 (three) times daily as needed for indigestion or heartburn.    . clobetasol cream (TEMOVATE) 2.69 % Apply 1 application topically 2 (two) times daily.    [name] HYDROcodone-acetaminophen (NORCO) 7.5-325 MG tablet Take 1-2 tablets by mouth every 4 (four) hours as needed for severe pain. 40 tablet 0  . Multiple Vitamin (MULTIVITAMIN WITH MINERALS) TABS tablet Take 1 tablet by mouth daily.    [name] omeprazole (PRILOSEC OTC) 20 MG tablet Take 20 mg by mouth daily as needed (acid reflux).     . potassium chloride (K-DUR) 10 MEQ tablet Take 10 mEq by mouth 2 (two) times daily.     [name] aspirin EC 325 MG EC tablet Take 1 tablet (325 mg total) by mouth 2 (two) times daily. For three weeks then transition to taking aspirin 81mg  daily for three additional weeks (Patient not taking: Reported on 03/21/2020) 40 tablet 0  . methocarbamol (ROBAXIN) 500 MG tablet Take 1 tablet (500 mg total) by mouth every 6 (six) hours as needed for muscle spasms. (Patient not taking: Reported on 03/21/2020) 40 tablet 0  . ondansetron (ZOFRAN) 4 MG tablet Take 1 tablet (4 mg total) by mouth every 6 (six) hours as needed for nausea. (Patient not taking: Reported on 03/21/2020) 20 tablet 0  . traMADol (ULTRAM) 50 MG tablet Take 1-2 tablets (50-100 mg total) by mouth every 6 (six) hours as needed for moderate pain. (Patient not taking: Reported on 03/21/2020) 40 tablet 0   No current facility-administered medications for this visit.   Allergies: Other, Demerol [meperidine], Dilaudid [hydromorphone hcl],  Macrodantin [nitrofurantoin], Sulfa antibiotics, Zithromax [azithromycin], Chlorhexidine gluconate, and Penicillins  No LMP recorded. Patient has had a hysterectomy.  Past medical history,surgical history, problem list, medications, allergies, family history and social history were all reviewed and documented as reviewed in the EPIC chart.  GYN ROS: no abnormal bleeding, pelvic pain or discharge, no breast pain or new or enlarging lumps on self exam.  No dysuria, frequency, burning, pain with urination, cloudy/malodorous urine.   OBJECTIVE:  BP 126/80   Ht 5\' 1"  (1.549 m)   Wt 161 lb (73 kg)   BMI 30.42 kg/m  The patient appears well, alert, oriented, in no distress.  BREAST EXAM: breasts appear normal, no suspicious masses, no skin or nipple changes or axillary nodes  PELVIC EXAM: VULVA: normal appearing vulva with no masses, tenderness or lesions, VAGINA: normal appearing vagina with normal color and discharge, no lesions, CERVIX: surgically absent, UTERUS: surgically absent, vaginal cuff normal, ADNEXA: no masses, nontender, PAP: Pap smear done today, thin-prep method  Chaperone: [name] present during the examination  ASSESSMENT:  84 y.o. G2P1011 here for a breast and pelvic exam  PLAN:   1. Postmenopausal. Prior TVH.  Significant hot flashes or night sweats.  No vaginal bleeding. 2.  History of chronic intermittent vulvitis.  Uses clobetasol 0.05% cream intermittently with good results.  30 g tube with 1 refill provided. 3. Pap smear 2020.  History of DES use.  No significant history of abnormal Pap smears.  Continue with annual Pap smear cytology. 4. Mammogram 02/2020.  Normal breast exam today.  She will continue with annual mammograms.   5. Colonoscopy >10 yrs ago.  She will follow up at the interval recommended by her primary care provider which she has discussed.  My recommendation to her is to do something for screening since her last screening was prior to age 77.  She  will further review this with her primary care provider. 6. DEXA 2015.  She says she had very high level BMD at that time.  She will consider doing another bone density as this would generally be the recommendation. 7.  History of occasional UTIs and she likes to keep a prescription for ciprofloxacin in the event she has one.  Black box warning reviewed and she understands that risk and prefers it to going on an alternative such as Bactrim, Macrobid, or penicillin.  Ciprofloxacin 250 mg #14 provided no refills. 8. Health maintenance.  No labs today as she normally has these completed elsewhere.  Return annually or sooner, prn.  Joseph Pierini MD 03/21/20

## 2020-03-21 NOTE — Addendum Note (Signed)
Addended by: Nelva Nay on: 03/21/2020 03:46 PM   Modules accepted: Orders

## 2020-03-23 LAB — PAP IG W/ RFLX HPV ASCU

## 2020-03-24 DIAGNOSIS — H52203 Unspecified astigmatism, bilateral: Secondary | ICD-10-CM | POA: Diagnosis not present

## 2020-03-24 DIAGNOSIS — H01111 Allergic dermatitis of right upper eyelid: Secondary | ICD-10-CM | POA: Diagnosis not present

## 2020-04-10 DIAGNOSIS — I10 Essential (primary) hypertension: Secondary | ICD-10-CM | POA: Diagnosis not present

## 2020-04-10 DIAGNOSIS — E876 Hypokalemia: Secondary | ICD-10-CM | POA: Diagnosis not present

## 2020-04-10 DIAGNOSIS — K625 Hemorrhage of anus and rectum: Secondary | ICD-10-CM | POA: Diagnosis not present

## 2020-04-21 ENCOUNTER — Other Ambulatory Visit: Payer: Self-pay

## 2020-04-21 ENCOUNTER — Encounter (HOSPITAL_COMMUNITY): Payer: Self-pay | Admitting: Emergency Medicine

## 2020-04-21 ENCOUNTER — Inpatient Hospital Stay (HOSPITAL_COMMUNITY)
Admission: EM | Admit: 2020-04-21 | Discharge: 2020-05-01 | DRG: 386 | Disposition: A | Discharge status: Home / Self Care | Payer: Medicare Other | Attending: Internal Medicine | Admitting: Internal Medicine

## 2020-04-21 ENCOUNTER — Emergency Department (HOSPITAL_COMMUNITY): Payer: Medicare Other

## 2020-04-21 DIAGNOSIS — D62 Acute posthemorrhagic anemia: Secondary | ICD-10-CM | POA: Diagnosis present

## 2020-04-21 DIAGNOSIS — M1610 Unilateral primary osteoarthritis, unspecified hip: Secondary | ICD-10-CM | POA: Diagnosis present

## 2020-04-21 DIAGNOSIS — Z882 Allergy status to sulfonamides status: Secondary | ICD-10-CM

## 2020-04-21 DIAGNOSIS — E876 Hypokalemia: Secondary | ICD-10-CM | POA: Diagnosis present

## 2020-04-21 DIAGNOSIS — K921 Melena: Secondary | ICD-10-CM

## 2020-04-21 DIAGNOSIS — Z91041 Radiographic dye allergy status: Secondary | ICD-10-CM | POA: Diagnosis not present

## 2020-04-21 DIAGNOSIS — M549 Dorsalgia, unspecified: Secondary | ICD-10-CM | POA: Diagnosis present

## 2020-04-21 DIAGNOSIS — Z90710 Acquired absence of both cervix and uterus: Secondary | ICD-10-CM | POA: Diagnosis not present

## 2020-04-21 DIAGNOSIS — K633 Ulcer of intestine: Secondary | ICD-10-CM | POA: Diagnosis not present

## 2020-04-21 DIAGNOSIS — R109 Unspecified abdominal pain: Secondary | ICD-10-CM | POA: Diagnosis not present

## 2020-04-21 DIAGNOSIS — Z888 Allergy status to other drugs, medicaments and biological substances status: Secondary | ICD-10-CM | POA: Diagnosis not present

## 2020-04-21 DIAGNOSIS — D638 Anemia in other chronic diseases classified elsewhere: Secondary | ICD-10-CM | POA: Diagnosis present

## 2020-04-21 DIAGNOSIS — K626 Ulcer of anus and rectum: Secondary | ICD-10-CM | POA: Diagnosis not present

## 2020-04-21 DIAGNOSIS — K625 Hemorrhage of anus and rectum: Secondary | ICD-10-CM

## 2020-04-21 DIAGNOSIS — E860 Dehydration: Secondary | ICD-10-CM | POA: Diagnosis present

## 2020-04-21 DIAGNOSIS — Z8249 Family history of ischemic heart disease and other diseases of the circulatory system: Secondary | ICD-10-CM

## 2020-04-21 DIAGNOSIS — Z803 Family history of malignant neoplasm of breast: Secondary | ICD-10-CM

## 2020-04-21 DIAGNOSIS — K51511 Left sided colitis with rectal bleeding: Secondary | ICD-10-CM | POA: Diagnosis present

## 2020-04-21 DIAGNOSIS — M797 Fibromyalgia: Secondary | ICD-10-CM | POA: Diagnosis present

## 2020-04-21 DIAGNOSIS — E871 Hypo-osmolality and hyponatremia: Secondary | ICD-10-CM | POA: Diagnosis not present

## 2020-04-21 DIAGNOSIS — K5190 Ulcerative colitis, unspecified, without complications: Secondary | ICD-10-CM | POA: Diagnosis not present

## 2020-04-21 DIAGNOSIS — Z96653 Presence of artificial knee joint, bilateral: Secondary | ICD-10-CM | POA: Diagnosis present

## 2020-04-21 DIAGNOSIS — I10 Essential (primary) hypertension: Secondary | ICD-10-CM | POA: Diagnosis not present

## 2020-04-21 DIAGNOSIS — Z96643 Presence of artificial hip joint, bilateral: Secondary | ICD-10-CM | POA: Diagnosis present

## 2020-04-21 DIAGNOSIS — Z885 Allergy status to narcotic agent status: Secondary | ICD-10-CM

## 2020-04-21 DIAGNOSIS — Z87891 Personal history of nicotine dependence: Secondary | ICD-10-CM | POA: Diagnosis not present

## 2020-04-21 DIAGNOSIS — K219 Gastro-esophageal reflux disease without esophagitis: Secondary | ICD-10-CM | POA: Diagnosis present

## 2020-04-21 DIAGNOSIS — K922 Gastrointestinal hemorrhage, unspecified: Secondary | ICD-10-CM | POA: Diagnosis present

## 2020-04-21 DIAGNOSIS — K529 Noninfective gastroenteritis and colitis, unspecified: Secondary | ICD-10-CM | POA: Diagnosis not present

## 2020-04-21 DIAGNOSIS — Z20822 Contact with and (suspected) exposure to covid-19: Secondary | ICD-10-CM | POA: Diagnosis present

## 2020-04-21 DIAGNOSIS — R140 Abdominal distension (gaseous): Secondary | ICD-10-CM

## 2020-04-21 DIAGNOSIS — Z8049 Family history of malignant neoplasm of other genital organs: Secondary | ICD-10-CM | POA: Diagnosis not present

## 2020-04-21 DIAGNOSIS — K5150 Left sided colitis without complications: Secondary | ICD-10-CM | POA: Diagnosis not present

## 2020-04-21 DIAGNOSIS — R197 Diarrhea, unspecified: Secondary | ICD-10-CM | POA: Diagnosis not present

## 2020-04-21 DIAGNOSIS — M169 Osteoarthritis of hip, unspecified: Secondary | ICD-10-CM | POA: Diagnosis present

## 2020-04-21 DIAGNOSIS — Z801 Family history of malignant neoplasm of trachea, bronchus and lung: Secondary | ICD-10-CM

## 2020-04-21 DIAGNOSIS — G8929 Other chronic pain: Secondary | ICD-10-CM | POA: Diagnosis present

## 2020-04-21 DIAGNOSIS — Z800 Family history of malignant neoplasm of digestive organs: Secondary | ICD-10-CM

## 2020-04-21 DIAGNOSIS — K5641 Fecal impaction: Secondary | ICD-10-CM | POA: Diagnosis present

## 2020-04-21 DIAGNOSIS — K5900 Constipation, unspecified: Secondary | ICD-10-CM | POA: Diagnosis not present

## 2020-04-21 DIAGNOSIS — R000 Tachycardia, unspecified: Secondary | ICD-10-CM | POA: Diagnosis not present

## 2020-04-21 DIAGNOSIS — Z79899 Other long term (current) drug therapy: Secondary | ICD-10-CM

## 2020-04-21 DIAGNOSIS — E870 Hyperosmolality and hypernatremia: Secondary | ICD-10-CM | POA: Diagnosis not present

## 2020-04-21 LAB — COMPREHENSIVE METABOLIC PANEL
ALT: 20 U/L (ref 0–44)
AST: 19 U/L (ref 15–41)
Albumin: 2.8 g/dL — ABNORMAL LOW (ref 3.5–5.0)
Alkaline Phosphatase: 72 U/L (ref 38–126)
Anion gap: 15 (ref 5–15)
BUN: 22 mg/dL (ref 8–23)
CO2: 21 mmol/L — ABNORMAL LOW (ref 22–32)
Calcium: 9.1 mg/dL (ref 8.9–10.3)
Chloride: 88 mmol/L — ABNORMAL LOW (ref 98–111)
Creatinine, Ser: 0.92 mg/dL (ref 0.44–1.00)
GFR, Estimated: >60 mL/min (ref >60)
Glucose, Bld: 117 mg/dL — ABNORMAL HIGH (ref 70–99)
Potassium: 3.4 mmol/L — ABNORMAL LOW (ref 3.5–5.1)
Sodium: 124 mmol/L — ABNORMAL LOW (ref 135–145)
Total Bilirubin: 0.4 mg/dL (ref 0.3–1.2)
Total Protein: 6.6 g/dL (ref 6.5–8.1)

## 2020-04-21 LAB — RESP PANEL BY RT-PCR (FLU A&B, COVID) ARPGX2
Influenza A by PCR: NEGATIVE
Influenza B by PCR: NEGATIVE
SARS Coronavirus 2 by RT PCR: NEGATIVE

## 2020-04-21 LAB — CBC
HCT: 32.1 % — ABNORMAL LOW (ref 36.0–46.0)
Hemoglobin: 11 g/dL — ABNORMAL LOW (ref 12.0–15.0)
MCH: 31.2 pg (ref 26.0–34.0)
MCHC: 34.3 g/dL (ref 30.0–36.0)
MCV: 90.9 fL (ref 80.0–100.0)
Platelets: 400 10*3/uL (ref 150–400)
RBC: 3.53 MIL/uL — ABNORMAL LOW (ref 3.87–5.11)
RDW: 12.8 % (ref 11.5–15.5)
WBC: 10.1 10*3/uL (ref 4.0–10.5)
nRBC: 0 % (ref 0.0–0.2)

## 2020-04-21 LAB — LIPASE, BLOOD: Lipase: 17 U/L (ref 11–51)

## 2020-04-21 LAB — POC OCCULT BLOOD, ED: Fecal Occult Bld: POSITIVE — AB

## 2020-04-21 LAB — TYPE AND SCREEN
ABO/RH(D): O POS
Antibody Screen: NEGATIVE

## 2020-04-21 MED ORDER — IOHEXOL 300 MG/ML  SOLN
100.0000 mL | Freq: Once | INTRAMUSCULAR | Status: AC | PRN
  Administered 2020-04-21: 100 mL via INTRAVENOUS

## 2020-04-21 MED ORDER — SODIUM CHLORIDE 0.9 % IV SOLN
8.0000 mg/h | INTRAVENOUS | Status: DC
  Filled 2020-04-21 (×2): qty 80

## 2020-04-21 MED ORDER — METRONIDAZOLE IN NACL 5-0.79 MG/ML-% IV SOLN
500.0000 mg | Freq: Three times a day (TID) | INTRAVENOUS | Status: DC
  Administered 2020-04-22 – 2020-04-24 (×8): 500 mg via INTRAVENOUS
  Filled 2020-04-21 (×8): qty 100

## 2020-04-21 MED ORDER — ONDANSETRON HCL 4 MG PO TABS
4.0000 mg | ORAL_TABLET | Freq: Four times a day (QID) | ORAL | Status: DC | PRN
  Administered 2020-04-24: 02:00:00 4 mg via ORAL
  Filled 2020-04-21: qty 1

## 2020-04-21 MED ORDER — PANTOPRAZOLE SODIUM 40 MG IV SOLR: 40.0000 mg | Freq: Two times a day (BID) | INTRAVENOUS | Status: DC

## 2020-04-21 MED ORDER — SODIUM CHLORIDE 0.9 % IV BOLUS (SEPSIS)
500.0000 mL | Freq: Once | INTRAVENOUS | Status: AC
  Administered 2020-04-21: 500 mL via INTRAVENOUS

## 2020-04-21 MED ORDER — SODIUM CHLORIDE 0.9 % IV SOLN
1000.0000 mL | INTRAVENOUS | Status: DC
  Administered 2020-04-21: 1000 mL via INTRAVENOUS

## 2020-04-21 MED ORDER — SODIUM CHLORIDE 0.9 % IV SOLN
80.0000 mg | Freq: Once | INTRAVENOUS | Status: AC
  Administered 2020-04-21: 80 mg via INTRAVENOUS
  Filled 2020-04-21 (×2): qty 80

## 2020-04-21 MED ORDER — CIPROFLOXACIN IN D5W 400 MG/200ML IV SOLN
400.0000 mg | Freq: Two times a day (BID) | INTRAVENOUS | Status: DC
  Administered 2020-04-21 – 2020-04-23 (×5): 400 mg via INTRAVENOUS
  Filled 2020-04-21 (×6): qty 200

## 2020-04-21 MED ORDER — SODIUM CHLORIDE 0.9 % IV SOLN: INTRAVENOUS | Status: DC

## 2020-04-21 MED ORDER — ONDANSETRON HCL 4 MG/2ML IJ SOLN: 4.0000 mg | Freq: Four times a day (QID) | INTRAMUSCULAR | Status: DC | PRN

## 2020-04-21 NOTE — Progress Notes (Signed)
Pharmacy Antibiotic Note  Crystal Ramsey is a 84 y.o. female admitted on 04/21/2020 with intra abdominal infection.  Pharmacy has been consulted for cipro dosing.  Plan: Cipro 400 mg IV q12 hours F/u cultures and clinical course  Weight: 73 kg (161 lb)  Temp (24hrs), Avg:98.9 F (37.2 C), Min:98.6 F (37 C), Max:99.2 F (37.3 C)  Recent Labs  Lab 04/21/20 1442  WBC 10.1  CREATININE 0.92    Estimated Creatinine Clearance: 43.1 mL/min (by C-G formula based on SCr of 0.92 mg/dL).    Allergies  Allergen Reactions  . Other Other (See Comments)    CONTRAST DYE-unknown  . Demerol [Meperidine] Itching and Nausea And Vomiting  . Dilaudid [Hydromorphone Hcl] Itching and Nausea And Vomiting  . Macrodantin [Nitrofurantoin] Other (See Comments)    unknown  . Sulfa Antibiotics Other (See Comments)    unknown  . Zithromax [Azithromycin] Other (See Comments)    flu  . Chlorhexidine Gluconate Rash  . Penicillins Other (See Comments)    Has patient had a PCN reaction causing immediate rash, facial/tongue/throat swelling, SOB or lightheadedness with hypotension: NO Has patient had a PCN reaction causing severe rash involving mucus membranes or skin necrosis: unknown Has patient had a PCN reaction that required hospitalization: unknown Has patient had a PCN reaction occurring within the last 10 years: No If all of the above answers are "NO", then may proceed with Cephalosporin use.  Unknown  Think it was vomiting, NO anaphylaxis     Thank you for allowing pharmacy to be a part of this patient's care.  Excell Seltzer Poteet 04/21/2020 11:07 PM

## 2020-04-21 NOTE — ED Provider Notes (Signed)
[facility] EMERGENCY DEPARTMENT Provider Note   CSN: [id number] Arrival date & time: 04/21/20  1427     History Chief Complaint  Patient presents with  . Abdominal Pain    Crystal Ramsey is a 84 y.o. female.  HPI Patient was referred to the emergency department from [place] for further diagnostic evaluation.  She was seen by PA-C Vicie Mutters today.  Patient reports that she has had bloody stool intermittently for about 2 weeks.  She reports the first 2 days of onset she was passing a lot of blood.  Blood then improved for a while but she again started passing bloody stool was seen at [facility] emergency department in [place].  Due to the bleeding and need for follow-up, she and her husband came back to the [place] area where they live.  Patient reports that now for a number of days she has been having fairly explosive diarrhea.  Reports when she goes into the bathroom there will be a lot of pressure and diarrhea or blood will bladder the toilet bowl walls.  She reports intermittently she has had a little bit of lower abdominal discomfort.  Is not been severe.  Blood is usually red in color patient has not particularly noted clots.  She has not had a colonoscopy since 84 years of age.  She had no prior history of colon cancer.  He has not had fever chills, no vomiting no diarrhea.  No shortness of breath or syncope.  No palpitations.  Patient is immunized for Covid.  She has not had any recent antibiotic therapies    Past Medical History:  Diagnosis Date  . Arthritis   . Back pain   . Complication of anesthesia   . Constipation, chronic   . DES exposure in utero   . Family history of adverse reaction to anesthesia    VOMITING  . Fibromyalgia    PT THINKS SHE HAS FIBROMYALGIA  . GERD (gastroesophageal reflux disease)   . 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 Hypertension   . Lichen plano-pilaris   . PONV  (postoperative nausea and vomiting)     Patient Active Problem List   Diagnosis Date Noted  . GI bleed 04/21/2020  . S/P total knee arthroplasty, left 07/19/2019  . Osteoarthritis of left knee 07/19/2019  . OA (osteoarthritis) of hip 09/07/2014  . Postop Hyponatremia 05/26/2012  . Postop Hypokalemia 05/26/2012  . OA (osteoarthritis) of knee 05/25/2012  . Hypertension   . Lichen plano-pilaris   . Arthritis     Past Surgical History:  Procedure Laterality Date  . ABDOMINAL SURGERY     Laparotomy  . BREAST SURGERY     Biopsy-benign  . CATARACT EXTRACTION    . OOPHORECTOMY     LSO  . PELVIC LAPAROSCOPY     DL  . TOTAL HIP ARTHROPLASTY Right 09/07/2014   Procedure: RIGHT TOTAL HIP ARTHROPLASTY ANTERIOR APPROACH;  Surgeon: Gaynelle Arabian, MD;  Location: [facility];  Service: Orthopedics;  Laterality: Right;  . TOTAL HIP ARTHROPLASTY Left 12/13/2015   Procedure: LEFT TOTAL HIP ARTHROPLASTY ANTERIOR APPROACH;  Surgeon: Gaynelle Arabian, MD;  Location: [facility];  Service: Orthopedics;  Laterality: Left;  . TOTAL KNEE ARTHROPLASTY  05/25/2012   Procedure: TOTAL KNEE ARTHROPLASTY;  Surgeon: Gearlean Alf, MD;  Location: [facility];  Service: Orthopedics;  Laterality: Right;  . TOTAL KNEE ARTHROPLASTY Left 07/19/2019   Procedure: TOTAL  KNEE ARTHROPLASTY;  Surgeon: Gaynelle Arabian, MD;  Location: [facility];  Service: Orthopedics;  Laterality: Left;  25min  . VAGINAL HYSTERECTOMY    . varicose vein ligation       OB History    Gravida  2   Para  1   Term  1   Preterm      AB  1   Living  1     SAB      IAB      Ectopic      Multiple      Live Births              Family History  Problem Relation Age of Onset  . Uterine cancer Mother   . Hypertension Father   . Lung cancer Brother   . Hypertension Sister   . Breast cancer Sister        Age 45  . Breast cancer Paternal Aunt        Age 22  . Stomach cancer Maternal Uncle   . Lung cancer Paternal Aunt   . Breast cancer  Daughter 64    Social History   Tobacco Use  . Smoking status: Former Smoker    Types: Cigarettes    Quit date: 05/16/1983    Years since quitting: 36.9  . Smokeless tobacco: Never Used  Vaping Use  . Vaping Use: Never used  Substance Use Topics  . Alcohol use: Yes    Alcohol/week: 7.0 standard drinks    Types: 7 Standard drinks or equivalent per week    Comment:  WINE PER NIGHT  . Drug use: No    Home Medications Prior to Admission medications   Medication Sig Start Date End Date Taking? Authorizing Provider  amiloride-hydrochlorothiazide (MODURETIC) 5-50 MG tablet Take 0.75 tablets by mouth daily. Takes 3/4  tablet    [provider]  aspirin EC 325 MG EC tablet Take 1 tablet (325 mg total) by mouth 2 (two) times daily. For three weeks then transition to taking aspirin 81mg  daily for three additional weeks Patient not taking: Reported on 03/21/2020 07/20/19   Ardeen Jourdain, PA-C  calcium carbonate (TUMS - DOSED IN MG ELEMENTAL CALCIUM) 500 MG chewable tablet Chew 2 tablets by mouth 3 (three) times daily as needed for indigestion or heartburn.    [provider]  ciprofloxacin (CIPRO) 250 MG tablet Take 1 tablet (250 mg total) by mouth 2 (two) times daily. For bladder infection 03/21/20   Joseph Pierini, MD  clobetasol cream (TEMOVATE) 8.34 % Apply 1 application topically 2 (two) times daily as needed. To external vulvar area for itching 03/21/20   Joseph Pierini, MD  HYDROcodone-acetaminophen [facility]) 7.5-325 MG tablet Take 1-2 tablets by mouth every 4 (four) hours as needed for severe pain. 07/20/19   Constable, Amber, PA-C  methocarbamol (ROBAXIN) 500 MG tablet Take 1 tablet (500 mg total) by mouth every 6 (six) hours as needed for muscle spasms. Patient not taking: Reported on 03/21/2020 07/20/19   Ardeen Jourdain, PA-C  Multiple Vitamin (MULTIVITAMIN WITH MINERALS) TABS tablet Take 1 tablet by mouth daily.    [provider]  omeprazole (PRILOSEC OTC) 20  MG tablet Take 20 mg by mouth daily as needed (acid reflux).     [provider]  ondansetron (ZOFRAN) 4 MG tablet Take 1 tablet (4 mg total) by mouth every 6 (six) hours as needed for nausea. Patient not taking: Reported on 03/21/2020 07/20/19   Ardeen Jourdain, PA-C  potassium  chloride (K-DUR) 10 MEQ tablet Take 10 mEq by mouth 2 (two) times daily.     [provider]  traMADol (ULTRAM) 50 MG tablet Take 1-2 tablets (50-100 mg total) by mouth every 6 (six) hours as needed for moderate pain. Patient not taking: Reported on 03/21/2020 07/20/19   Ardeen Jourdain, PA-C    Allergies    Other, Demerol [meperidine], Dilaudid [hydromorphone hcl], Macrodantin [nitrofurantoin], Sulfa antibiotics, Zithromax [azithromycin], Chlorhexidine gluconate, and Penicillins  Review of Systems   Review of Systems 10 systems reviewed and negative except as per HPI Physical Exam Updated Vital Signs BP 136/72   Pulse 100   Temp 99.2 F (37.3 C) (Oral)   Resp (!) 23   Wt 73 kg   SpO2 95%   BMI 30.42 kg/m   Physical Exam Constitutional:      Appearance: She is well-developed and well-nourished.  HENT:     Head: Normocephalic and atraumatic.  Eyes:     Extraocular Movements: EOM normal.     Pupils: Pupils are equal, round, and reactive to light.  Cardiovascular:     Rate and Rhythm: Normal rate and regular rhythm.     Pulses: Intact distal pulses.     Heart sounds: Normal heart sounds.  Pulmonary:     Effort: Pulmonary effort is normal.     Breath sounds: Normal breath sounds.  Abdominal:     General: Bowel sounds are normal. There is no distension.     Palpations: Abdomen is soft.     Tenderness: There is no abdominal tenderness.  Genitourinary:    Comments: Normal anal area.  No hemorrhoids.  Slight diffuse bogginess of the rectum.  Mildly tender diffusely.  Trace red bloody mucus in stool. Musculoskeletal:        General: No edema. Normal range of motion.     Cervical back:  Neck supple.  Skin:    General: Skin is warm, dry and intact.  Neurological:     Mental Status: She is alert and oriented to person, place, and time.     GCS: GCS eye subscore is 4. GCS verbal subscore is 5. GCS motor subscore is 6.     Coordination: Coordination normal.     Deep Tendon Reflexes: Strength normal.  Psychiatric:        Mood and Affect: Mood and affect normal.     ED Results / Procedures / Treatments   Labs (all labs ordered are listed, but only abnormal results are displayed) Labs Reviewed  COMPREHENSIVE METABOLIC PANEL - Abnormal; Notable for the following components:      Result Value   Sodium 124 (*)    Potassium 3.4 (*)    Chloride 88 (*)    CO2 21 (*)    Glucose, Bld 117 (*)    Albumin 2.8 (*)    All other components within normal limits  CBC - Abnormal; Notable for the following components:   RBC 3.53 (*)    Hemoglobin 11.0 (*)    HCT 32.1 (*)    All other components within normal limits  POC OCCULT BLOOD, ED - Abnormal; Notable for the following components:   Fecal Occult Bld POSITIVE (*)    All other components within normal limits  RESP PANEL BY RT-PCR (FLU A&B, COVID) ARPGX2  GASTROINTESTINAL PANEL BY PCR, STOOL (REPLACES STOOL CULTURE)  C DIFFICILE (CDIFF) QUICK SCRN (NO PCR REFLEX)  LIPASE, BLOOD  URINALYSIS, ROUTINE W REFLEX MICROSCOPIC  TYPE AND SCREEN    EKG  None  Radiology CT Abdomen Pelvis W Contrast  Result Date: 04/21/2020 CLINICAL DATA:  Abdominal pain, rectal bleeding, diarrhea for 3 weeks EXAM: CT ABDOMEN AND PELVIS WITH CONTRAST TECHNIQUE: Multidetector CT imaging of the abdomen and pelvis was performed using the standard protocol following bolus administration of intravenous contrast. CONTRAST:  161mL OMNIPAQUE IOHEXOL 300 MG/ML  SOLN COMPARISON:  None. FINDINGS: Lower chest: Moderate hiatal hernia with intrathoracic position of the gastric fundus. Scarring or atelectasis of the bilateral lung bases. Hepatobiliary: No solid liver  abnormality is seen. Multiple small low and fluid attenuation liver lesions, likely small cysts and/or hemangiomata. No gallstones, gallbladder wall thickening, or biliary dilatation. Pancreas: Unremarkable. No pancreatic ductal dilatation or surrounding inflammatory changes. Spleen: Normal in size without significant abnormality. Adrenals/Urinary Tract: Adrenal glands are unremarkable. Kidneys are normal, without renal calculi, solid lesion, or hydronephrosis. Mildly distended urinary bladder. Stomach/Bowel: Stomach is within normal limits. Appendix is not clearly visualized and may be surgically absent. Long segment wall thickening of the sigmoid colon (series 4, image 58). There does not appear to be significant underlying diverticular disease. Fluid in the rectum. Vascular/Lymphatic: Aortic atherosclerosis. No enlarged abdominal or pelvic lymph nodes. Reproductive: No mass or other significant abnormality. Other: No abdominal wall hernia or abnormality. No abdominopelvic ascites. Musculoskeletal: No acute or significant osseous findings. Status post bilateral hip total arthroplasty. IMPRESSION: 1. Long segment wall thickening of the sigmoid colon , consistent with nonspecific infectious, inflammatory, or ischemic colitis. There does not appear to be significant underlying diverticular disease. Findings are consistent with nonspecific infectious, inflammatory, or ischemic colitis, and without specific findings to localize nidus of GI bleeding. 2. Fluid in the rectum, consistent with diarrheal illness. 3. Moderate hiatal hernia with intrathoracic position of the gastric fundus. 4. Mildly distended urinary bladder. Correlate for urinary retention. Aortic Atherosclerosis (ICD10-I70.0). Electronically Signed   By: Eddie Candle M.D.   On: 04/21/2020 19:01    Procedures Procedures (including critical care time)  Medications Ordered in ED Medications  sodium chloride 0.9 % bolus 500 mL (has no administration in  time range)    Followed by  0.9 %  sodium chloride infusion (has no administration in time range)  iohexol (OMNIPAQUE) 300 MG/ML solution 100 mL (100 mLs Intravenous Contrast Given 04/21/20 1851)    ED Course  I have reviewed the triage vital signs and the nursing notes.  Pertinent labs & imaging results that were available during my care of the patient were reviewed by me and considered in my medical decision making (see chart for details).  Clinical Course as of 04/21/20 2000  Fri Apr 21, 2020  1803 Consult: Reviewed with Dr. Alessandra Bevels.  Suggest get CT scan.  There was concern for possible infectious etiology such as diverticulitis or colitis.  Patient had mild tachycardia and low-grade temperature in the office today.  If CT positive probable admission.  If CT negative probable discharge with follow-up. [MP]    Clinical Course User Index [MP] Charlesetta Shanks, MD   MDM Rules/Calculators/A&P                          Patient presents as outlined above.  She had been seen at the emergency department in [facility] while on vacation.  He had follow-up today at the GI clinic with [facility] physicians.  He was referred to the emergency department for further diagnostic evaluation.  Globin is stable.  Patient does have a red bloody stool.  She  has had large volumes of diarrhea.  No antibiotics.  No apparent C. difficile risk factors.  We will proceed with CT abdomen to evaluate for diverticulitis or colitis.  Will consult GI for further instructions on desired diagnostic evaluation and disposition.  CT shows diffuse colitis.  Patient is having bloody stool.  She is hyponatremic.  Will plan for admission for hydration and further evaluation for hyponatremia and bloody diarrhea.  Consult: Dr. Mcarthur Rossetti for admission Final Clinical Impression(s) / ED Diagnoses Final diagnoses:  Colitis  Hyponatremia  Bloody stool    Rx / DC Orders ED Discharge Orders    None       Charlesetta Shanks, MD 04/21/20 2001

## 2020-04-21 NOTE — ED Notes (Signed)
Pt used same hat in commode, Unable to collect urine/stool sample a this time.

## 2020-04-21 NOTE — ED Triage Notes (Signed)
Pt sent by GI clinic for further evaluation for abd pain, rectal bleed and diarrhea for the past 3 weeks.

## 2020-04-21 NOTE — H&P (Signed)
History and Physical   [name]:[id number] DOB: [birth year] DOA: 04/21/2020  Referring MD/NP/PA: Dr. Vallery Ridge  PCP: Lavone Orn, MD   Outpatient Specialists: [facility]  Patient coming from: Home  Chief Complaint: Rectal bleeding  HPI: Crystal Ramsey is a 84 y.o. female with medical history significant of osteoarthritis, GERD, chronic constipation, chronic back pain and fibromyalgia who went to [facility] today for evaluation.  Patient reported intermittent rectal bleed for the past 2 weeks.  She had heavy bleeding initially but it has stayed at all.  She apparently was seen at an outside hospital in [facility] emergency department in [place].  They went there on vacation and they were discharged so they decided to come home to [place].  This episode has been followed by diarrhea which she describes as explosive in nature.  Denied any nausea or vomiting denied any hematemesis.  Patient has had previous colonoscopies the last was when she was 84 years old.  At this point she has no bleeding episode in the ER.  Vitals remained stable and hemoglobin is at 11 g.  Fecal occult blood testing however is positive.  She is being admitted to the hospital for management of slow GI bleed..  ED Course: Temperature 99.2 blood pressure is 145/72 pulse 125 respirate 23 oxygen sat 90% on room air.  White count 10.1 hemoglobin 11.0 platelets 400.  Sodium is 124 potassium 3.4 chloride 88 CO2 21 BUN 22 creatinine 0.92.  Fecal occult blood testing is positive in viral screen is negative.  CT abdomen pelvis showed long some apparent wall thickening of the sigmoid colon which is consistent with nonspecific colitis.  No significant diverticular disease.  Evidence of diarrheal illness.  At this point patient is being admitted for evaluation of lower GI bleed most likely secondary to colitis.  Review of Systems: As per HPI otherwise 10 point review of systems negative.    Past Medical History:   Diagnosis Date  . Arthritis   . Back pain   . Complication of anesthesia   . Constipation, chronic   . DES exposure in utero   . Family history of adverse reaction to anesthesia    VOMITING  . Fibromyalgia    PT THINKS SHE HAS FIBROMYALGIA  . GERD (gastroesophageal reflux disease)   . 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 Hypertension   . Lichen plano-pilaris   . PONV (postoperative nausea and vomiting)     Past Surgical History:  Procedure Laterality Date  . ABDOMINAL SURGERY     Laparotomy  . BREAST SURGERY     Biopsy-benign  . CATARACT EXTRACTION    . OOPHORECTOMY     LSO  . PELVIC LAPAROSCOPY     DL  . TOTAL HIP ARTHROPLASTY Right 09/07/2014   Procedure: RIGHT TOTAL HIP ARTHROPLASTY ANTERIOR APPROACH;  Surgeon: Gaynelle Arabian, MD;  Location: [facility];  Service: Orthopedics;  Laterality: Right;  . TOTAL HIP ARTHROPLASTY Left 12/13/2015   Procedure: LEFT TOTAL HIP ARTHROPLASTY ANTERIOR APPROACH;  Surgeon: Gaynelle Arabian, MD;  Location: [facility];  Service: Orthopedics;  Laterality: Left;  . TOTAL KNEE ARTHROPLASTY  05/25/2012   Procedure: TOTAL KNEE ARTHROPLASTY;  Surgeon: Gearlean Alf, MD;  Location: [facility];  Service: Orthopedics;  Laterality: Right;  . TOTAL KNEE ARTHROPLASTY Left 07/19/2019   Procedure: TOTAL KNEE ARTHROPLASTY;  Surgeon: Gaynelle Arabian, MD;  Location: [facility];  Service: Orthopedics;  Laterality: Left;  67min  . VAGINAL HYSTERECTOMY    . varicose vein ligation       reports that she quit smoking about 36 years ago. Her smoking use included cigarettes. She has never used smokeless tobacco. She reports current alcohol use of about 7.0 standard drinks of alcohol per week. She reports that she does not use drugs.  Allergies  Allergen Reactions  . Other Other (See Comments)    CONTRAST DYE-unknown  . Demerol [Meperidine] Itching and Nausea And Vomiting  . Dilaudid [Hydromorphone Hcl] Itching and Nausea And Vomiting   . Macrodantin [Nitrofurantoin] Other (See Comments)    unknown  . Sulfa Antibiotics Other (See Comments)    unknown  . Zithromax [Azithromycin] Other (See Comments)    flu  . Chlorhexidine Gluconate Rash  . Penicillins Other (See Comments)    Has patient had a PCN reaction causing immediate rash, facial/tongue/throat swelling, SOB or lightheadedness with hypotension: NO Has patient had a PCN reaction causing severe rash involving mucus membranes or skin necrosis: unknown Has patient had a PCN reaction that required hospitalization: unknown Has patient had a PCN reaction occurring within the last 10 years: No If all of the above answers are "NO", then may proceed with Cephalosporin use.  Unknown  Think it was vomiting, NO anaphylaxis    Family History  Problem Relation Age of Onset  . Uterine cancer Mother   . Hypertension Father   . Lung cancer Brother   . Hypertension Sister   . Breast cancer Sister        Age 21  . Breast cancer Paternal Aunt        Age 1  . Stomach cancer Maternal Uncle   . Lung cancer Paternal Aunt   . Breast cancer Daughter 24     Prior to Admission medications   Medication Sig Start Date End Date Taking? Authorizing Provider  amiloride-hydrochlorothiazide (MODURETIC) 5-50 MG tablet Take 0.75 tablets by mouth daily. Takes 3/4  tablet    [provider]  aspirin EC 325 MG EC tablet Take 1 tablet (325 mg total) by mouth 2 (two) times daily. For three weeks then transition to taking aspirin 81mg  daily for three additional weeks Patient not taking: Reported on 03/21/2020 07/20/19   Ardeen Jourdain, PA-C  calcium carbonate (TUMS - DOSED IN MG ELEMENTAL CALCIUM) 500 MG chewable tablet Chew 2 tablets by mouth 3 (three) times daily as needed for indigestion or heartburn.    [provider]  ciprofloxacin (CIPRO) 250 MG tablet Take 1 tablet (250 mg total) by mouth 2 (two) times daily. For bladder infection 03/21/20   Joseph Pierini, MD   clobetasol cream (TEMOVATE) 9.70 % Apply 1 application topically 2 (two) times daily as needed. To external vulvar area for itching 03/21/20   Joseph Pierini, MD  HYDROcodone-acetaminophen [facility]) 7.5-325 MG tablet Take 1-2 tablets by mouth every 4 (four) hours as needed for severe pain. 07/20/19   Constable, Amber, PA-C  methocarbamol (ROBAXIN) 500 MG tablet Take 1 tablet (500 mg total) by mouth every 6 (six) hours as needed for muscle spasms. Patient not taking: Reported on 03/21/2020 07/20/19   Ardeen Jourdain, PA-C  Multiple Vitamin (MULTIVITAMIN WITH MINERALS) TABS tablet Take 1 tablet by mouth daily.    [provider]  omeprazole (PRILOSEC OTC) 20 MG tablet Take 20 mg by mouth daily as needed (acid reflux).     [provider]  ondansetron (ZOFRAN) 4 MG tablet Take 1 tablet (4 mg total) by mouth  every 6 (six) hours as needed for nausea. Patient not taking: Reported on 03/21/2020 07/20/19   Ardeen Jourdain, PA-C  potassium chloride (K-DUR) 10 MEQ tablet Take 10 mEq by mouth 2 (two) times daily.     [provider]  traMADol (ULTRAM) 50 MG tablet Take 1-2 tablets (50-100 mg total) by mouth every 6 (six) hours as needed for moderate pain. Patient not taking: Reported on 03/21/2020 07/20/19   Ardeen Jourdain, PA-C    Physical Exam: Vitals:   04/21/20 1745 04/21/20 2027 04/21/20 2100 04/21/20 2200  BP:  (!) 145/72 117/70 (!) 141/71  Pulse: 100 97 94 (!) 106  Resp: (!) 23 18 11 20   Temp:      TempSrc:      SpO2: 95% 100% 91% 90%  Weight:          Constitutional: Anxious, mildly uncomfortable Vitals:   04/21/20 1745 04/21/20 2027 04/21/20 2100 04/21/20 2200  BP:  (!) 145/72 117/70 (!) 141/71  Pulse: 100 97 94 (!) 106  Resp: (!) 23 18 11 20   Temp:      TempSrc:      SpO2: 95% 100% 91% 90%  Weight:       Eyes: PERRL, lids and conjunctivae normal ENMT: Mucous membranes are dry. Posterior pharynx clear of any exudate or lesions.Normal dentition.  Neck:  normal, supple, no masses, no thyromegaly Respiratory: clear to auscultation bilaterally, no wheezing, no crackles. Normal respiratory effort. No accessory muscle use.  Cardiovascular: Sinus tachycardia, no murmurs / rubs / gallops. No extremity edema. 2+ pedal pulses. No carotid bruits.  Abdomen: Mild diffuse tenderness, no masses palpated. No hepatosplenomegaly. Bowel sounds positive.  Musculoskeletal: no clubbing / cyanosis. No joint deformity upper and lower extremities. Good ROM, no contractures. Normal muscle tone.  Skin: no rashes, lesions, ulcers. No induration Neurologic: CN 2-12 grossly intact. Sensation intact, DTR normal. Strength 5/5 in all 4.  Psychiatric: Normal judgment and insight. Alert and oriented x 3. Normal mood.     Labs on Admission: I have personally reviewed following labs and imaging studies  CBC: Recent Labs  Lab 04/21/20 1442  WBC 10.1  HGB 11.0*  HCT 32.1*  MCV 90.9  PLT 283   Basic Metabolic Panel: Recent Labs  Lab 04/21/20 1442  NA 124*  K 3.4*  CL 88*  CO2 21*  GLUCOSE 117*  BUN 22  CREATININE 0.92  CALCIUM 9.1   GFR: Estimated Creatinine Clearance: 43.1 mL/min (by C-G formula based on SCr of 0.92 mg/dL). Liver Function Tests: Recent Labs  Lab 04/21/20 1442  AST 19  ALT 20  ALKPHOS 72  BILITOT 0.4  PROT 6.6  ALBUMIN 2.8*   Recent Labs  Lab 04/21/20 1442  LIPASE 17   No results for input(s): AMMONIA in the last 168 hours. Coagulation Profile: No results for input(s): INR, PROTIME in the last 168 hours. Cardiac Enzymes: No results for input(s): CKTOTAL, CKMB, CKMBINDEX, TROPONINI in the last 168 hours. BNP (last 3 results) No results for input(s): PROBNP in the last 8760 hours. HbA1C: No results for input(s): HGBA1C in the last 72 hours. CBG: No results for input(s): GLUCAP in the last 168 hours. Lipid Profile: No results for input(s): CHOL, HDL, LDLCALC, TRIG, CHOLHDL, LDLDIRECT in the last 72 hours. Thyroid Function  Tests: No results for input(s): TSH, T4TOTAL, FREET4, T3FREE, THYROIDAB in the last 72 hours. Anemia Panel: No results for input(s): VITAMINB12, FOLATE, FERRITIN, TIBC, IRON, RETICCTPCT in the last 72 hours. Urine analysis:  Component Value Date/Time   COLORURINE YELLOW 12/07/2015 1020   APPEARANCEUR CLEAR 12/07/2015 1020   LABSPEC 1.012 12/07/2015 1020   PHURINE 6.5 12/07/2015 1020   GLUCOSEU NEGATIVE 12/07/2015 1020   HGBUR NEGATIVE 12/07/2015 1020   BILIRUBINUR NEGATIVE 12/07/2015 1020   KETONESUR NEGATIVE 12/07/2015 1020   PROTEINUR NEGATIVE 12/07/2015 1020   UROBILINOGEN 0.2 08/31/2014 1420   NITRITE NEGATIVE 12/07/2015 1020   LEUKOCYTESUR TRACE (A) 12/07/2015 1020   Sepsis Labs: @LABRCNTIP (procalcitonin:4,lacticidven:4) ) Recent Results (from the past 240 hour(s))  Resp Panel by RT-PCR (Flu A&B, Covid) Nasopharyngeal Swab     Status: None   Collection Time: 04/21/20  5:19 PM   Specimen: Nasopharyngeal Swab; Nasopharyngeal(NP) swabs in vial transport medium  Result Value Ref Range Status   SARS Coronavirus 2 by RT PCR NEGATIVE NEGATIVE Final    Comment: (NOTE) SARS-CoV-2 target nucleic acids are NOT DETECTED.  The SARS-CoV-2 RNA is generally detectable in upper respiratory specimens during the acute phase of infection. The lowest concentration of SARS-CoV-2 viral copies this assay can detect is 138 copies/mL. A negative result does not preclude SARS-Cov-2 infection and should not be used as the sole basis for treatment or other patient management decisions. A negative result may occur with  improper specimen collection/handling, submission of specimen other than nasopharyngeal swab, presence of viral mutation(s) within the areas targeted by this assay, and inadequate number of viral copies(<138 copies/mL). A negative result must be combined with clinical observations, patient history, and epidemiological information. The expected result is Negative.  Fact Sheet for  Patients:  EntrepreneurPulse.com.au  Fact Sheet for Healthcare Providers:  IncredibleEmployment.be  This test is no t yet approved or cleared by the [place] FDA and  has been authorized for detection and/or diagnosis of SARS-CoV-2 by FDA under an Emergency Use Authorization (EUA). This EUA will remain  in effect (meaning this test can be used) for the duration of the COVID-19 declaration under Section 564(b)(1) of the Act, 21 U.S.C.section 360bbb-3(b)(1), unless the authorization is terminated  or revoked sooner.       Influenza A by PCR NEGATIVE NEGATIVE Final   Influenza B by PCR NEGATIVE NEGATIVE Final    Comment: (NOTE) The Xpert Xpress SARS-CoV-2/FLU/RSV plus assay is intended as an aid in the diagnosis of influenza from Nasopharyngeal swab specimens and should not be used as a sole basis for treatment. Nasal washings and aspirates are unacceptable for Xpert Xpress SARS-CoV-2/FLU/RSV testing.  Fact Sheet for Patients: EntrepreneurPulse.com.au  Fact Sheet for Healthcare Providers: IncredibleEmployment.be  This test is not yet approved or cleared by the [place] FDA and has been authorized for detection and/or diagnosis of SARS-CoV-2 by FDA under an Emergency Use Authorization (EUA). This EUA will remain in effect (meaning this test can be used) for the duration of the COVID-19 declaration under Section 564(b)(1) of the Act, 21 U.S.C. section 360bbb-3(b)(1), unless the authorization is terminated or revoked.  Performed at [facility], [place] [address] [place], [place] [address]      Radiological Exams on Admission: CT Abdomen Pelvis W Contrast  Result Date: 04/21/2020 CLINICAL DATA:  Abdominal pain, rectal bleeding, diarrhea for 3 weeks EXAM: CT ABDOMEN AND PELVIS WITH CONTRAST TECHNIQUE: Multidetector CT imaging of the abdomen and pelvis was performed using the standard protocol  following bolus administration of intravenous contrast. CONTRAST:  115mL OMNIPAQUE IOHEXOL 300 MG/ML  SOLN COMPARISON:  None. FINDINGS: Lower chest: Moderate hiatal hernia with intrathoracic position of the gastric fundus. Scarring or atelectasis  of the bilateral lung bases. Hepatobiliary: No solid liver abnormality is seen. Multiple small low and fluid attenuation liver lesions, likely small cysts and/or hemangiomata. No gallstones, gallbladder wall thickening, or biliary dilatation. Pancreas: Unremarkable. No pancreatic ductal dilatation or surrounding inflammatory changes. Spleen: Normal in size without significant abnormality. Adrenals/Urinary Tract: Adrenal glands are unremarkable. Kidneys are normal, without renal calculi, solid lesion, or hydronephrosis. Mildly distended urinary bladder. Stomach/Bowel: Stomach is within normal limits. Appendix is not clearly visualized and may be surgically absent. Long segment wall thickening of the sigmoid colon (series 4, image 58). There does not appear to be significant underlying diverticular disease. Fluid in the rectum. Vascular/Lymphatic: Aortic atherosclerosis. No enlarged abdominal or pelvic lymph nodes. Reproductive: No mass or other significant abnormality. Other: No abdominal wall hernia or abnormality. No abdominopelvic ascites. Musculoskeletal: No acute or significant osseous findings. Status post bilateral hip total arthroplasty. IMPRESSION: 1. Long segment wall thickening of the sigmoid colon , consistent with nonspecific infectious, inflammatory, or ischemic colitis. There does not appear to be significant underlying diverticular disease. Findings are consistent with nonspecific infectious, inflammatory, or ischemic colitis, and without specific findings to localize nidus of GI bleeding. 2. Fluid in the rectum, consistent with diarrheal illness. 3. Moderate hiatal hernia with intrathoracic position of the gastric fundus. 4. Mildly distended urinary bladder.  Correlate for urinary retention. Aortic Atherosclerosis (ICD10-I70.0). Electronically Signed   By: Eddie Candle M.D.   On: 04/21/2020 19:01      Assessment/Plan Principal Problem:   GI bleed Active Problems:   Hypertension   OA (osteoarthritis) of hip   Hyponatremia   Hypokalemia   Colitis, acute     #1 rectal bleed: Suspected secondary to colitis.  Other possible causes include diverticular disease will be evaluated.  Patient will be admitted.  Hemodynamically stable.  No indication for transfusion at this point.  Will check serial H&H.  IV Protonix.  GI consulted and will see patient in the morning and make further recommendations.  #2 acute colitis: Differentials include inflammatory, ischemic, infectious.  Patient will be initiated on empiric IV Rocephin and metronidazole.  Will await GI inputs  #3 hypertension: May resume home regimen.  #4 hyponatremia: Appears chronic.  She has had previous hyponatremia.  Will hydrate use saline and monitor sodium level.  It could be SIADH.  #5 hypokalemia: Replete.  #6 osteoarthritis with fibromyalgia: Resume home regiment of pain medicine when stable.  Avoid NSAIDs in the setting of GI bleed   DVT prophylaxis: SCD Code Status: Full code Family Communication: No family at bedside Disposition Plan: Home Consults called: [facility] consult in the morning Admission status: Inpatient  Severity of Illness: The appropriate patient status for this patient is INPATIENT. Inpatient status is judged to be reasonable and necessary in order to provide the required intensity of service to ensure the patient's safety. The patient's presenting symptoms, physical exam findings, and initial radiographic and laboratory data in the context of their chronic comorbidities is felt to place them at high risk for further clinical deterioration. Furthermore, it is not anticipated that the patient will be medically stable for discharge from the hospital within 2  midnights of admission. The following factors support the patient status of inpatient.   " The patient's presenting symptoms include rectal bleed. " The worrisome physical exam findings include mild diffuse tenderness. " The initial radiographic and laboratory data are worrisome because of colitis on CT. " The chronic co-morbidities include hypertension.   * I certify that at the  point of admission it is my clinical judgment that the patient will require inpatient hospital care spanning beyond 2 midnights from the point of admission due to high intensity of service, high risk for further deterioration and high frequency of surveillance required.Barbette Merino MD [facility] Pager [phone]  If 7PM-7AM, please contact night-coverage [email or web address] Password [facility]  04/21/2020, 10:56 PM

## 2020-04-22 DIAGNOSIS — K921 Melena: Secondary | ICD-10-CM

## 2020-04-22 LAB — CBC
HCT: 28.9 % — ABNORMAL LOW (ref 36.0–46.0)
HCT: 29 % — ABNORMAL LOW (ref 36.0–46.0)
HCT: 29.7 % — ABNORMAL LOW (ref 36.0–46.0)
Hemoglobin: 10 g/dL — ABNORMAL LOW (ref 12.0–15.0)
Hemoglobin: 10 g/dL — ABNORMAL LOW (ref 12.0–15.0)
Hemoglobin: 9.6 g/dL — ABNORMAL LOW (ref 12.0–15.0)
MCH: 30.8 pg (ref 26.0–34.0)
MCH: 30.8 pg (ref 26.0–34.0)
MCH: 31.8 pg (ref 26.0–34.0)
MCHC: 33.1 g/dL (ref 30.0–36.0)
MCHC: 33.7 g/dL (ref 30.0–36.0)
MCHC: 34.6 g/dL (ref 30.0–36.0)
MCV: 91.4 fL (ref 80.0–100.0)
MCV: 92 fL (ref 80.0–100.0)
MCV: 92.9 fL (ref 80.0–100.0)
Platelets: 327 10*3/uL (ref 150–400)
Platelets: 338 10*3/uL (ref 150–400)
Platelets: 397 10*3/uL (ref 150–400)
RBC: 3.12 MIL/uL — ABNORMAL LOW (ref 3.87–5.11)
RBC: 3.14 MIL/uL — ABNORMAL LOW (ref 3.87–5.11)
RBC: 3.25 MIL/uL — ABNORMAL LOW (ref 3.87–5.11)
RDW: 12.7 % (ref 11.5–15.5)
RDW: 12.7 % (ref 11.5–15.5)
RDW: 12.9 % (ref 11.5–15.5)
WBC: 8.1 10*3/uL (ref 4.0–10.5)
WBC: 8.8 10*3/uL (ref 4.0–10.5)
WBC: 9.6 10*3/uL (ref 4.0–10.5)
nRBC: 0 % (ref 0.0–0.2)
nRBC: 0 % (ref 0.0–0.2)
nRBC: 0 % (ref 0.0–0.2)

## 2020-04-22 LAB — C DIFFICILE (CDIFF) QUICK SCRN (NO PCR REFLEX)
C Diff antigen: NEGATIVE
C Diff interpretation: NOT DETECTED
C Diff toxin: NEGATIVE

## 2020-04-22 LAB — COMPREHENSIVE METABOLIC PANEL
ALT: 17 U/L (ref 0–44)
AST: 16 U/L (ref 15–41)
Albumin: 2.2 g/dL — ABNORMAL LOW (ref 3.5–5.0)
Alkaline Phosphatase: 63 U/L (ref 38–126)
Anion gap: 9 (ref 5–15)
BUN: 15 mg/dL (ref 8–23)
CO2: 24 mmol/L (ref 22–32)
Calcium: 8.5 mg/dL — ABNORMAL LOW (ref 8.9–10.3)
Chloride: 92 mmol/L — ABNORMAL LOW (ref 98–111)
Creatinine, Ser: 0.74 mg/dL (ref 0.44–1.00)
GFR, Estimated: >60 mL/min (ref >60)
Glucose, Bld: 109 mg/dL — ABNORMAL HIGH (ref 70–99)
Potassium: 3.2 mmol/L — ABNORMAL LOW (ref 3.5–5.1)
Sodium: 125 mmol/L — ABNORMAL LOW (ref 135–145)
Total Bilirubin: 0.7 mg/dL (ref 0.3–1.2)
Total Protein: 5.4 g/dL — ABNORMAL LOW (ref 6.5–8.1)

## 2020-04-22 LAB — GASTROINTESTINAL PANEL BY PCR, STOOL (REPLACES STOOL CULTURE)

## 2020-04-22 MED ORDER — POTASSIUM CHLORIDE CRYS ER 20 MEQ PO TBCR
40.0000 meq | EXTENDED_RELEASE_TABLET | Freq: Once | ORAL | Status: AC
  Administered 2020-04-22: 12:00:00 40 meq via ORAL
  Filled 2020-04-22: qty 2

## 2020-04-22 MED ORDER — HYDROCODONE-ACETAMINOPHEN 7.5-325 MG PO TABS
1.0000 | ORAL_TABLET | ORAL | Status: DC | PRN
  Administered 2020-04-23 – 2020-05-01 (×34): 1 via ORAL
  Filled 2020-04-22 (×35): qty 1

## 2020-04-22 NOTE — Progress Notes (Signed)
PROGRESS NOTE    Crystal Ramsey  MWU:[id number] DOB: May 24, 1935 DOA: 04/21/2020 PCP: Lavone Orn, MD  Brief Narrative: 84 year old female with history of DJD/chronic back pain, chronic constipation, GERD, developed diarrhea with bloody specks about 10 to 12 days ago while she was vacationing in [place], was seen in an ER there and decided to come back to [place], diarrhea improved briefly for couple days and then worsened again 3 days ago still with spikes/blobs of bright red blood.  -Then went to [facility] gastroenterology office, was advised to go to the ER -In the ED hemoglobin was around 10, sodium was low at 124, along with mild hypokalemia, CT abdomen pelvis noted thickening of the sigmoid colon, concerning for sigmoid colitis   Assessment & Plan:   Sigmoid colitis Mild hematochezia -Clinically do not suspect ischemic colitis, denies any pain, no tenderness on exam -Continue ciprofloxacin/Flagyl -Continue IV fluids today -C. difficile PCR is negative, follow-up GI pathogen panel, Covid PCR was also negative -Monitor hemoglobin -Discontinue PPI  Chronic pain Osteoarthritis -Resume home regimen of hydrocodone  Hypokalemia -Replace  Hyponatremia -Likely secondary to GI losses, hydrate and monitor -HCTZ on hold  DVT prophylaxis: SCDs Code Status: Full code Family Communication: Discussed with patient in detail, no family at bedside Disposition Plan:  Status is: Inpatient  Remains inpatient appropriate because:Inpatient level of care appropriate due to severity of illness   Dispo: The patient is from: Home              Anticipated d/c is to: Home              Anticipated d/c date is: 1 day              Patient currently is not medically stable to d/c.     Procedures:   Antimicrobials:    Subjective: -Still having diarrhea, bleeding has improved  Objective: Vitals:   04/22/20 0524 04/22/20 0656 04/22/20 0700 04/22/20 0727  BP: 115/67 138/67 139/69    Pulse: 78 88 88   Resp: 13 12 12    Temp:    98 F (36.7 C)  TempSrc:    Oral  SpO2: 95% 95% 96%   Weight:        Intake/Output Summary (Last 24 hours) at 04/22/2020 1106 Last data filed at 04/22/2020 7253 Gross per 24 hour  Intake 1970.57 ml  Output --  Net 1970.57 ml   Filed Weights   04/21/20 1431  Weight: 73 kg    Examination:  General exam: Pleasant elderly female sitting up in bed, AAOx3, no distress CVS: S1-S2, regular rate rhythm,  Lungs: Clear bilaterally Abdomen: Soft, nontender, mildly distended, bowel sounds present Extremities: No edema Skin: No rashes on exposed skin  Psychiatry:. Mood & affect appropriate.     Data Reviewed:   CBC: Recent Labs  Lab 04/21/20 1442 04/21/20 2356 04/22/20 0353  WBC 10.1 8.8 8.1  HGB 11.0* 10.0* 9.6*  HCT 32.1* 28.9* 29.0*  MCV 90.9 92.0 92.9  PLT 400 327 664   Basic Metabolic Panel: Recent Labs  Lab 04/21/20 1442 04/22/20 0353  NA 124* 125*  K 3.4* 3.2*  CL 88* 92*  CO2 21* 24  GLUCOSE 117* 109*  BUN 22 15  CREATININE 0.92 0.74  CALCIUM 9.1 8.5*   GFR: Estimated Creatinine Clearance: 49.6 mL/min (by C-G formula based on SCr of 0.74 mg/dL). Liver Function Tests: Recent Labs  Lab 04/21/20 1442 04/22/20 0353  AST 19 16  ALT 20 17  ALKPHOS  72 63  BILITOT 0.4 0.7  PROT 6.6 5.4*  ALBUMIN 2.8* 2.2*   Recent Labs  Lab 04/21/20 1442  LIPASE 17   No results for input(s): AMMONIA in the last 168 hours. Coagulation Profile: No results for input(s): INR, PROTIME in the last 168 hours. Cardiac Enzymes: No results for input(s): CKTOTAL, CKMB, CKMBINDEX, TROPONINI in the last 168 hours. BNP (last 3 results) No results for input(s): PROBNP in the last 8760 hours. HbA1C: No results for input(s): HGBA1C in the last 72 hours. CBG: No results for input(s): GLUCAP in the last 168 hours. Lipid Profile: No results for input(s): CHOL, HDL, LDLCALC, TRIG, CHOLHDL, LDLDIRECT in the last 72 hours. Thyroid  Function Tests: No results for input(s): TSH, T4TOTAL, FREET4, T3FREE, THYROIDAB in the last 72 hours. Anemia Panel: No results for input(s): VITAMINB12, FOLATE, FERRITIN, TIBC, IRON, RETICCTPCT in the last 72 hours. Urine analysis:    Component Value Date/Time   COLORURINE YELLOW 12/07/2015 1020   APPEARANCEUR CLEAR 12/07/2015 1020   LABSPEC 1.012 12/07/2015 1020   PHURINE 6.5 12/07/2015 1020   GLUCOSEU NEGATIVE 12/07/2015 1020   HGBUR NEGATIVE 12/07/2015 1020   BILIRUBINUR NEGATIVE 12/07/2015 1020   KETONESUR NEGATIVE 12/07/2015 1020   PROTEINUR NEGATIVE 12/07/2015 1020   UROBILINOGEN 0.2 08/31/2014 1420   NITRITE NEGATIVE 12/07/2015 1020   LEUKOCYTESUR TRACE (A) 12/07/2015 1020   Sepsis Labs: @LABRCNTIP (procalcitonin:4,lacticidven:4)  ) Recent Results (from the past 240 hour(s))  Resp Panel by RT-PCR (Flu A&B, Covid) Nasopharyngeal Swab     Status: None   Collection Time: 04/21/20  5:19 PM   Specimen: Nasopharyngeal Swab; Nasopharyngeal(NP) swabs in vial transport medium  Result Value Ref Range Status   SARS Coronavirus 2 by RT PCR NEGATIVE NEGATIVE Final    Comment: (NOTE) SARS-CoV-2 target nucleic acids are NOT DETECTED.  The SARS-CoV-2 RNA is generally detectable in upper respiratory specimens during the acute phase of infection. The lowest concentration of SARS-CoV-2 viral copies this assay can detect is 138 copies/mL. A negative result does not preclude SARS-Cov-2 infection and should not be used as the sole basis for treatment or other patient management decisions. A negative result may occur with  improper specimen collection/handling, submission of specimen other than nasopharyngeal swab, presence of viral mutation(s) within the areas targeted by this assay, and inadequate number of viral copies(<138 copies/mL). A negative result must be combined with clinical observations, patient history, and epidemiological information. The expected result is  Negative.  Fact Sheet for Patients:  EntrepreneurPulse.com.au  Fact Sheet for Healthcare Providers:  IncredibleEmployment.be  This test is no t yet approved or cleared by the [place] FDA and  has been authorized for detection and/or diagnosis of SARS-CoV-2 by FDA under an Emergency Use Authorization (EUA). This EUA will remain  in effect (meaning this test can be used) for the duration of the COVID-19 declaration under Section 564(b)(1) of the Act, 21 U.S.C.section 360bbb-3(b)(1), unless the authorization is terminated  or revoked sooner.       Influenza A by PCR NEGATIVE NEGATIVE Final   Influenza B by PCR NEGATIVE NEGATIVE Final    Comment: (NOTE) The Xpert Xpress SARS-CoV-2/FLU/RSV plus assay is intended as an aid in the diagnosis of influenza from Nasopharyngeal swab specimens and should not be used as a sole basis for treatment. Nasal washings and aspirates are unacceptable for Xpert Xpress SARS-CoV-2/FLU/RSV testing.  Fact Sheet for Patients: EntrepreneurPulse.com.au  Fact Sheet for Healthcare Providers: IncredibleEmployment.be  This test is not yet approved or cleared  by the [place] and has been authorized for detection and/or diagnosis of SARS-CoV-2 by FDA under an Emergency Use Authorization (EUA). This EUA will remain in effect (meaning this test can be used) for the duration of the COVID-19 declaration under Section 564(b)(1) of the Act, 21 U.S.C. section 360bbb-3(b)(1), unless the authorization is terminated or revoked.  Performed at [facility], [place] [address] [place], [place] [address]   C Difficile Quick Screen (NO PCR Reflex)     Status: None   Collection Time: 04/21/20  9:56 PM   Specimen: Stool  Result Value Ref Range Status   C Diff antigen NEGATIVE NEGATIVE Final   C Diff toxin NEGATIVE NEGATIVE Final   C Diff interpretation No C. difficile detected.   Final    Comment: Performed at [facility], [place] [address], [place], [place] [address]         Radiology Studies: CT Abdomen Pelvis W Contrast  Result Date: 04/21/2020 CLINICAL DATA:  Abdominal pain, rectal bleeding, diarrhea for 3 weeks EXAM: CT ABDOMEN AND PELVIS WITH CONTRAST TECHNIQUE: Multidetector CT imaging of the abdomen and pelvis was performed using the standard protocol following bolus administration of intravenous contrast. CONTRAST:  127mL OMNIPAQUE IOHEXOL 300 MG/ML  SOLN COMPARISON:  None. FINDINGS: Lower chest: Moderate hiatal hernia with intrathoracic position of the gastric fundus. Scarring or atelectasis of the bilateral lung bases. Hepatobiliary: No solid liver abnormality is seen. Multiple small low and fluid attenuation liver lesions, likely small cysts and/or hemangiomata. No gallstones, gallbladder wall thickening, or biliary dilatation. Pancreas: Unremarkable. No pancreatic ductal dilatation or surrounding inflammatory changes. Spleen: Normal in size without significant abnormality. Adrenals/Urinary Tract: Adrenal glands are unremarkable. Kidneys are normal, without renal calculi, solid lesion, or hydronephrosis. Mildly distended urinary bladder. Stomach/Bowel: Stomach is within normal limits. Appendix is not clearly visualized and may be surgically absent. Long segment wall thickening of the sigmoid colon (series 4, image 58). There does not appear to be significant underlying diverticular disease. Fluid in the rectum. Vascular/Lymphatic: Aortic atherosclerosis. No enlarged abdominal or pelvic lymph nodes. Reproductive: No mass or other significant abnormality. Other: No abdominal wall hernia or abnormality. No abdominopelvic ascites. Musculoskeletal: No acute or significant osseous findings. Status post bilateral hip total arthroplasty. IMPRESSION: 1. Long segment wall thickening of the sigmoid colon , consistent with nonspecific infectious, inflammatory, or ischemic  colitis. There does not appear to be significant underlying diverticular disease. Findings are consistent with nonspecific infectious, inflammatory, or ischemic colitis, and without specific findings to localize nidus of GI bleeding. 2. Fluid in the rectum, consistent with diarrheal illness. 3. Moderate hiatal hernia with intrathoracic position of the gastric fundus. 4. Mildly distended urinary bladder. Correlate for urinary retention. Aortic Atherosclerosis (ICD10-I70.0). Electronically Signed   By: Eddie Candle M.D.   On: 04/21/2020 19:01        Scheduled Meds: . potassium chloride  40 mEq Oral Once   Continuous Infusions: . sodium chloride Stopped (04/22/20 0705)  . ciprofloxacin Stopped (04/22/20 0051)  . metronidazole Stopped (04/22/20 0656)     LOS: 1 day    Time spent: 63min  Domenic Polite, MD [facility]  04/22/2020, 11:06 AM

## 2020-04-22 NOTE — ED Notes (Signed)
Lunch tray delivered.

## 2020-04-22 NOTE — Progress Notes (Signed)
Patient has Tums and other medications at the bedside in her bags.  She is wanting to take medications without letting staff know.  I tried to explain to patient that it is against hospital policy for her to take medications that we are not aware of and it is for her safety.  Patient became irate but stated she understood and will have her daughter take meds home tomorrow.

## 2020-04-22 NOTE — ED Notes (Signed)
Attempted to give report again and was told nurse is wheeling pt she d/c'd out and will call back.

## 2020-04-22 NOTE — ED Notes (Signed)
Attempted to give report 3rd time and nurse told me she was taking her lunch break and could not take report. I attempted to give report to charge nurse, but she did not answer her phone.

## 2020-04-22 NOTE — ED Notes (Signed)
Provided pt with wash cloth and clean gown. Pt washed herself up at the sink in her room

## 2020-04-22 NOTE — ED Notes (Signed)
Attempted to give report and was told the nurse will call back 

## 2020-04-22 NOTE — ED Notes (Signed)
Pt decided to take her own medicine, which was tums. I told her the importance of [place] giving her the medicine so we have documented exactly what she is taking and it doesn't interact with other meds and she told me she won't be here much longer. I told her it's still important that we know what she is taking and have it documented and that we need to give her the medicine.

## 2020-04-22 NOTE — Consult Note (Signed)
Referring Provider:  EDP Primary Care Physician:  Lavone Orn, MD Primary Gastroenterologist:  [facility]  Reason for Consultation: Abdominal pain, abnormal CT scan showing colitis  HPI: Crystal Ramsey is a 84 y.o. female with past medical history of chronic GERD, chronic constipation and fibromyalgia was sent to emergency room from [place] office for further evaluation of lower abdominal pain, rectal bleeding, tachycardia and low-grade fever.  Patient with intermittent rectal bleeding for last 2 weeks now.  Was seen in the [facility] emergency room on April 10, 2020.  Because of ongoing bleeding she was seen in the GI office yesterday.  She was found to have right lower quadrant tenderness, she was tachycardic and had low-grade fever and she was advised to go to emergency department for further evaluation.  Upon initial evaluation yesterday in the emergency department, blood work showed hemoglobin of 11 which is stable, normal white count, normal LFTs normal lipase.  C. difficile negative.  Occult blood was positive.  Patient's hemoglobin has dropped to 9.6 this morning.  CT abdomen pelvis with contrast yesterday showed long segment wall thickening of the sigmoid colon and moderate-sized hiatal hernia.  Last colonoscopy around 2006 showed melanosis otherwise no findings.    Patient seen and examined at bedside in the emergency room.  Patient's daughter at bedside.  She is feeling better.  Last bleeding episodes 2 days ago.  Denies any nausea or vomiting.  She is feeling hungry.    Past Medical History:  Diagnosis Date  . Arthritis   . Back pain   . Complication of anesthesia   . Constipation, chronic   . DES exposure in utero   . Family history of adverse reaction to anesthesia    VOMITING  . Fibromyalgia    PT THINKS SHE HAS FIBROMYALGIA  . GERD (gastroesophageal reflux disease)   . 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  Hypertension   . Lichen plano-pilaris   . PONV (postoperative nausea and vomiting)     Past Surgical History:  Procedure Laterality Date  . ABDOMINAL SURGERY     Laparotomy  . BREAST SURGERY     Biopsy-benign  . CATARACT EXTRACTION    . OOPHORECTOMY     LSO  . PELVIC LAPAROSCOPY     DL  . TOTAL HIP ARTHROPLASTY Right 09/07/2014   Procedure: RIGHT TOTAL HIP ARTHROPLASTY ANTERIOR APPROACH;  Surgeon: Gaynelle Arabian, MD;  Location: [facility];  Service: Orthopedics;  Laterality: Right;  . TOTAL HIP ARTHROPLASTY Left 12/13/2015   Procedure: LEFT TOTAL HIP ARTHROPLASTY ANTERIOR APPROACH;  Surgeon: Gaynelle Arabian, MD;  Location: [facility];  Service: Orthopedics;  Laterality: Left;  . TOTAL KNEE ARTHROPLASTY  05/25/2012   Procedure: TOTAL KNEE ARTHROPLASTY;  Surgeon: Gearlean Alf, MD;  Location: [facility];  Service: Orthopedics;  Laterality: Right;  . TOTAL KNEE ARTHROPLASTY Left 07/19/2019   Procedure: TOTAL KNEE ARTHROPLASTY;  Surgeon: Gaynelle Arabian, MD;  Location: [facility];  Service: Orthopedics;  Laterality: Left;  91min  . VAGINAL HYSTERECTOMY    . varicose vein ligation      Prior to Admission medications   Medication Sig Start Date End Date Taking? Authorizing Provider  amiloride-hydrochlorothiazide (MODURETIC) 5-50 MG tablet Take 0.75 tablets by mouth daily. Takes 3/4  tablet   Yes [provider]  calcium carbonate (TUMS - DOSED IN MG ELEMENTAL CALCIUM) 500 MG chewable tablet Chew 2 tablets by mouth 3 (three) times daily as  needed for indigestion or heartburn.   Yes [provider]  clobetasol cream (TEMOVATE) 7.02 % Apply 1 application topically 2 (two) times daily as needed. To external vulvar area for itching 03/21/20  Yes Joseph Pierini, MD  HYDROcodone-acetaminophen (NORCO) 7.5-325 MG tablet Take 1-2 tablets by mouth every 4 (four) hours as needed for severe pain. 07/20/19  Yes Constable, Museum/gallery conservator, PA-C  Multiple Vitamin (MULTIVITAMIN WITH MINERALS) TABS tablet Take 1 tablet by  mouth daily.   Yes [provider]  omeprazole (PRILOSEC OTC) 20 MG tablet Take 20 mg by mouth daily as needed (acid reflux).    Yes [provider]  potassium chloride (K-DUR) 10 MEQ tablet Take 10 mEq by mouth 2 (two) times daily.    Yes [provider]  aspirin EC 325 MG EC tablet Take 1 tablet (325 mg total) by mouth 2 (two) times daily. For three weeks then transition to taking aspirin 81mg  daily for three additional weeks Patient not taking: No sig reported 07/20/19   Ardeen Jourdain, PA-C  ciprofloxacin (CIPRO) 250 MG tablet Take 1 tablet (250 mg total) by mouth 2 (two) times daily. For bladder infection Patient not taking: No sig reported 03/21/20   Joseph Pierini, MD  methocarbamol (ROBAXIN) 500 MG tablet Take 1 tablet (500 mg total) by mouth every 6 (six) hours as needed for muscle spasms. Patient not taking: No sig reported 07/20/19   Cecilio Asper, [employer], PA-C  ondansetron (ZOFRAN) 4 MG tablet Take 1 tablet (4 mg total) by mouth every 6 (six) hours as needed for nausea. Patient not taking: No sig reported 07/20/19   Cecilio Asper, [employer], PA-C  traMADol (ULTRAM) 50 MG tablet Take 1-2 tablets (50-100 mg total) by mouth every 6 (six) hours as needed for moderate pain. Patient not taking: No sig reported 07/20/19   Ardeen Jourdain, PA-C    Scheduled Meds: . potassium chloride  40 mEq Oral Once   Continuous Infusions: . sodium chloride Stopped (04/22/20 0705)  . ciprofloxacin Stopped (04/22/20 0051)  . metronidazole Stopped (04/22/20 0656)   PRN Meds:.HYDROcodone-acetaminophen, ondansetron **OR** ondansetron (ZOFRAN) IV  Allergies as of 04/21/2020 - Review Complete 04/21/2020  Allergen Reaction Noted  . Other Other (See Comments) 11/19/2011  . Demerol [meperidine] Itching and Nausea And Vomiting 11/19/2011  . Dilaudid [hydromorphone hcl] Itching and Nausea And Vomiting 11/19/2011  . Macrodantin [nitrofurantoin] Other (See Comments) 11/19/2011  . Sulfa antibiotics  Other (See Comments) 11/19/2011  . Zithromax [azithromycin] Other (See Comments) 05/12/2012  . Chlorhexidine gluconate Rash 08/31/2014  . Penicillins Other (See Comments) 11/19/2011    Family History  Problem Relation Age of Onset  . Uterine cancer Mother   . Hypertension Father   . Lung cancer Brother   . Hypertension Sister   . Breast cancer Sister        Age 3  . Breast cancer Paternal Aunt        Age 70  . Stomach cancer Maternal Uncle   . Lung cancer Paternal Aunt   . Breast cancer Daughter 48    Social History   Socioeconomic History  . Marital status: Married    Spouse name: Not on file  . Number of children: Not on file  . Years of education: Not on file  . Highest education level: Not on file  Occupational History  . Not on file  Tobacco Use  . Smoking status: Former Smoker    Types: Cigarettes    Quit date: 05/16/1983    Years since quitting:  36.9  . Smokeless tobacco: Never Used  Vaping Use  . Vaping Use: Never used  Substance and Sexual Activity  . Alcohol use: Yes    Alcohol/week: 7.0 standard drinks    Types: 7 Standard drinks or equivalent per week    Comment:  WINE PER NIGHT  . Drug use: No  . Sexual activity: Never    Birth control/protection: Surgical    Comment: 1st intercourse 84 yo-Fewer than 5 partners  Other Topics Concern  . Not on file  Social History Narrative  . Not on file   Social Determinants of Health   Financial Resource Strain: Not on file  Food Insecurity: Not on file  Transportation Needs: Not on file  Physical Activity: Not on file  Stress: Not on file  Social Connections: Not on file  Intimate Partner Violence: Not on file    Review of Systems: All negative except as stated above in HPI.  Physical Exam: Vital signs: Vitals:   04/22/20 0700 04/22/20 0727  BP: 139/69   Pulse: 88   Resp: 12   Temp:  98 F (36.7 C)  SpO2: 96%      General:   Alert,  Well-developed, well-nourished, pleasant and cooperative in  NAD HEENT: Normocephalic, atraumatic, extraocular movement intact Lungs:  Clear throughout to auscultation.   No wheezes, crackles, or rhonchi. No acute distress. Heart:  Regular rate and rhythm; no murmurs, clicks, rubs,  or gallops. Abdomen: Soft, nontender, nondistended, bowel sounds present.  No peritoneal signs Lower extremity.  No edema  Neuro.  Alert/oriented x3 Psych.  Mood and affect normal Rectal:  Deferred  GI:  Lab Results: Recent Labs    04/21/20 1442 04/21/20 2356 04/22/20 0353  WBC 10.1 8.8 8.1  HGB 11.0* 10.0* 9.6*  HCT 32.1* 28.9* 29.0*  PLT 400 327 338   BMET Recent Labs    04/21/20 1442 04/22/20 0353  NA 124* 125*  K 3.4* 3.2*  CL 88* 92*  CO2 21* 24  GLUCOSE 117* 109*  BUN 22 15  CREATININE 0.92 0.74  CALCIUM 9.1 8.5*   LFT Recent Labs    04/22/20 0353  PROT 5.4*  ALBUMIN 2.2*  AST 16  ALT 17  ALKPHOS 63  BILITOT 0.7   PT/INR No results for input(s): LABPROT, INR in the last 72 hours.   Studies/Results: CT Abdomen Pelvis W Contrast  Result Date: 04/21/2020 CLINICAL DATA:  Abdominal pain, rectal bleeding, diarrhea for 3 weeks EXAM: CT ABDOMEN AND PELVIS WITH CONTRAST TECHNIQUE: Multidetector CT imaging of the abdomen and pelvis was performed using the standard protocol following bolus administration of intravenous contrast. CONTRAST:  168mL OMNIPAQUE IOHEXOL 300 MG/ML  SOLN COMPARISON:  None. FINDINGS: Lower chest: Moderate hiatal hernia with intrathoracic position of the gastric fundus. Scarring or atelectasis of the bilateral lung bases. Hepatobiliary: No solid liver abnormality is seen. Multiple small low and fluid attenuation liver lesions, likely small cysts and/or hemangiomata. No gallstones, gallbladder wall thickening, or biliary dilatation. Pancreas: Unremarkable. No pancreatic ductal dilatation or surrounding inflammatory changes. Spleen: Normal in size without significant abnormality. Adrenals/Urinary Tract: Adrenal glands are  unremarkable. Kidneys are normal, without renal calculi, solid lesion, or hydronephrosis. Mildly distended urinary bladder. Stomach/Bowel: Stomach is within normal limits. Appendix is not clearly visualized and may be surgically absent. Long segment wall thickening of the sigmoid colon (series 4, image 58). There does not appear to be significant underlying diverticular disease. Fluid in the rectum. Vascular/Lymphatic: Aortic atherosclerosis. No enlarged abdominal or pelvic  lymph nodes. Reproductive: No mass or other significant abnormality. Other: No abdominal wall hernia or abnormality. No abdominopelvic ascites. Musculoskeletal: No acute or significant osseous findings. Status post bilateral hip total arthroplasty. IMPRESSION: 1. Long segment wall thickening of the sigmoid colon , consistent with nonspecific infectious, inflammatory, or ischemic colitis. There does not appear to be significant underlying diverticular disease. Findings are consistent with nonspecific infectious, inflammatory, or ischemic colitis, and without specific findings to localize nidus of GI bleeding. 2. Fluid in the rectum, consistent with diarrheal illness. 3. Moderate hiatal hernia with intrathoracic position of the gastric fundus. 4. Mildly distended urinary bladder. Correlate for urinary retention. Aortic Atherosclerosis (ICD10-I70.0). Electronically Signed   By: Eddie Candle M.D.   On: 04/21/2020 19:01    Impression/Plan: -Intermittent rectal bleeding for last 2 weeks.  CT scan showed sigmoid colitis.  No previous GI symptoms.  Most likely infectious colitis versus ischemic colitis. -Acute blood loss anemia.  No bleeding in last 2 days.  Recommendations ------------------------- -Continue supportive care with IV antibiotics for now -Started on full liquid diet -Monitor H&H -If improves clinically, recommend outpatient colonoscopy in 6 to 8 weeks once acute issues are resolved. -If no improvement, may consider inpatient  colonoscopy on Monday -GI will follow  Management plan was discussed with patient and patient's daughter at bedside.     LOS: 1 day   Otis Brace  MD, FACP 04/22/2020, 11:08 AM  Contact #  [phone]

## 2020-04-23 DIAGNOSIS — K921 Melena: Secondary | ICD-10-CM

## 2020-04-23 LAB — CBC
HCT: 26.4 % — ABNORMAL LOW (ref 36.0–46.0)
Hemoglobin: 9.4 g/dL — ABNORMAL LOW (ref 12.0–15.0)
MCH: 31.9 pg (ref 26.0–34.0)
MCHC: 35.6 g/dL (ref 30.0–36.0)
MCV: 89.5 fL (ref 80.0–100.0)
Platelets: 373 10*3/uL (ref 150–400)
RBC: 2.95 MIL/uL — ABNORMAL LOW (ref 3.87–5.11)
RDW: 12.6 % (ref 11.5–15.5)
WBC: 7.5 10*3/uL (ref 4.0–10.5)
nRBC: 0 % (ref 0.0–0.2)

## 2020-04-23 LAB — BASIC METABOLIC PANEL
Anion gap: 10 (ref 5–15)
BUN: 11 mg/dL (ref 8–23)
CO2: 21 mmol/L — ABNORMAL LOW (ref 22–32)
Calcium: 8 mg/dL — ABNORMAL LOW (ref 8.9–10.3)
Chloride: 95 mmol/L — ABNORMAL LOW (ref 98–111)
Creatinine, Ser: 0.67 mg/dL (ref 0.44–1.00)
GFR, Estimated: >60 mL/min (ref >60)
Glucose, Bld: 113 mg/dL — ABNORMAL HIGH (ref 70–99)
Potassium: 3.2 mmol/L — ABNORMAL LOW (ref 3.5–5.1)
Sodium: 126 mmol/L — ABNORMAL LOW (ref 135–145)

## 2020-04-23 MED ORDER — POTASSIUM CHLORIDE CRYS ER 20 MEQ PO TBCR
40.0000 meq | EXTENDED_RELEASE_TABLET | Freq: Once | ORAL | Status: AC
  Administered 2020-04-23: 11:00:00 40 meq via ORAL
  Filled 2020-04-23: qty 2

## 2020-04-23 MED ORDER — CIPROFLOXACIN HCL 250 MG PO TABS: 250.0000 mg | ORAL_TABLET | Freq: Two times a day (BID) | ORAL | 0 refills | Status: DC

## 2020-04-23 MED ORDER — METRONIDAZOLE 250 MG PO TABS: 250.0000 mg | ORAL_TABLET | Freq: Three times a day (TID) | ORAL | 0 refills | Status: DC

## 2020-04-23 NOTE — Progress Notes (Signed)
[facility] Gastroenterology Progress Note  Crystal Ramsey 84 y.o. 14-May-1935  CC: Rectal bleeding, colitis   Subjective: Patient seen and examined at bedside.  Feeling better.  Had small amount of bleeding last night.  Had few episodes of loose stools.  Denies abdominal pain.  Had some mild lower abdominal discomfort  ROS : Afebrile, negative for chest pain   Objective: Vital signs in last 24 hours: Vitals:   04/23/20 0455 04/23/20 0500  BP: 138/73 138/73  Pulse: 85 85  Resp: 17 17  Temp: 97.9 F (36.6 C) 97.9 F (36.6 C)  SpO2: 96% 96%    Physical Exam:  General:  Alert, cooperative, no distress, appears stated age  Head:  Normocephalic, without obvious abnormality, atraumatic  Eyes:  , EOM's intact,   Lungs:   Clear to auscultation bilaterally, respirations unlabored  Heart:  Regular rate and rhythm, S1, S2 normal  Abdomen:   Soft, non-tender, bowel sounds active all four quadrants,  no masses,   Extremities: Extremities normal, atraumatic, no  edema  Pulses: 2+ and symmetric    Lab Results: Recent Labs    04/22/20 0353 04/23/20 0423  NA 125* 126*  K 3.2* 3.2*  CL 92* 95*  CO2 24 21*  GLUCOSE 109* 113*  BUN 15 11  CREATININE 0.74 0.67  CALCIUM 8.5* 8.0*   Recent Labs    04/21/20 1442 04/22/20 0353  AST 19 16  ALT 20 17  ALKPHOS 72 63  BILITOT 0.4 0.7  PROT 6.6 5.4*  ALBUMIN 2.8* 2.2*   Recent Labs    04/22/20 1639 04/23/20 0423  WBC 9.6 7.5  HGB 10.0* 9.4*  HCT 29.7* 26.4*  MCV 91.4 89.5  PLT 397 373   No results for input(s): LABPROT, INR in the last 72 hours.    Assessment/Plan: -Intermittent rectal bleeding for last 2 weeks.  CT scan showed sigmoid colitis.  No previous GI symptoms.  Most likely infectious colitis versus ischemic colitis. -Acute blood loss anemia.  No bleeding in last 3 days.  Recommendations ------------------------- -Feeling somewhat better.  Offered her 1 more day of observation in the hospital but she declined.   She would like to go home on oral antibiotics. -Advance diet to soft -Okay to discharge home from GI standpoint on oral ciprofloxacin and Flagyl for total 10 days. -Follow-up in GI clinic in 2 to 4 weeks after discharge. -Recommend outpatient colonoscopy in 6 to 8 weeks once acute issues are resolved. -GI will sign off.  Call us back if needed  Otis Brace MD, [place] 04/23/2020, 9:19 AM  Contact #  [phone]

## 2020-04-23 NOTE — Progress Notes (Signed)
Patient found with home medications in her bed.  Patient admitted to taking a hydrocodone at approximately 1500 today.  Patient educated about safety risk of taking medications her care team is not aware of. Patient became angry when told she could not take her home medications.  Patient has been noted to have had her home meds at bedside and wanting to take them without staff knowledge. Patient advised to send the medications home with family in the morning.  Vitals stable at this time. PM provider made aware.

## 2020-04-23 NOTE — Discharge Summary (Signed)
Physician Discharge Summary  Crystal Ramsey IRS:[id number] DOB: 11/14/1935 DOA: 04/21/2020  PCP: Lavone Orn, MD  Admit date: 04/21/2020 Discharge date: 04/23/2020  Time spent: 35 minutes  Recommendations for Outpatient Follow-up:  PCP in 1 week, please check BMP at [facility] gastroenterology in 2 to 3 weeks   Discharge Diagnoses:  Principal Problem: Sigmoid colitis Acute blood loss anemia   Hypertension   OA (osteoarthritis) of hip   Hyponatremia   Hypokalemia   Colitis, acute   Bloody stool   Discharge Condition: Stable  Diet recommendation: Soft, bland diet for 3 to 4 days  Filed Weights   04/21/20 1431 04/22/20 1710  Weight: 73 kg 73 kg    History of present illness:  84 year old female with history of DJD/chronic back pain, chronic constipation, GERD, developed diarrhea with bloody specks about 10 to 12 days ago while she was vacationing in [place], was seen in an ER there and decided to come back to [place], diarrhea improved briefly for couple days and then worsened again 3 days ago still with specks of bright red blood.  -Then went to [facility] gastro[facility]ology office, was advised to go to the ER -In the ED hemoglobin was around 10, sodium was low at 124, along with mild hypokalemia, CT abdomen pelvis noted thickening of the sigmoid colon, concerning for sigmoid colitis   Hospital Course:   Sigmoid colitis Mild hematochezia -admitted with painless diarrhea with streaks of blood, CT on admission was concerning for sigmoid colitis, clinically suspected to be infectious colitis -C. difficile PCR and GI pathogen panel are negative -Treated with IV ciprofloxacin/Flagyl and fluids for hydration -Clinically improving with supportive care, still having diarrhea -Diet advanced, patient is anxious to go home today, advised to stay another day however she was adamant to go home, relatively overall stable though still with diarrhea -Discharged home on oral Cipro  Flagyl -Advised follow-up with gastroenterology in few weeks  Chronic pain Osteoarthritis -Resumed home regimen of hydrocodone  Hypokalemia -Replaced  Hyponatremia -Likely secondary to GI losses, improving with hydration -HCTZ discontinued at discharge due to ongoing diarrhea  Consultations: [facility]   Discharge Exam: Vitals:   04/23/20 0455 04/23/20 0500  BP: 138/73 138/73  Pulse: 85 85  Resp: 17 17  Temp: 97.9 F (36.6 C) 97.9 F (36.6 C)  SpO2: 96% 96%    General: AAOx3 Cardiovascular: S1S2/RRR Respiratory: CTAB  Discharge Instructions   Discharge Instructions    Discharge instructions   Complete by: As directed    Soft [place] diet for 3-4days   Increase activity slowly   Complete by: As directed      Allergies as of 04/23/2020      Reactions   Other Other (See Comments)   CONTRAST DYE-unknown   Demerol [meperidine] Itching, Nausea And Vomiting   Dilaudid [hydromorphone Hcl] Itching, Nausea And Vomiting   Macrodantin [nitrofurantoin] Other (See Comments)   unknown   Sulfa Antibiotics Other (See Comments)   unknown   Zithromax [azithromycin] Other (See Comments)   flu   Chlorhexidine Gluconate Rash   Penicillins Other (See Comments)   Has patient had a PCN reaction causing immediate rash, facial/tongue/throat swelling, SOB or lightheadedness with hypotension: NO Has patient had a PCN reaction causing severe rash involving mucus membranes or skin necrosis: unknown Has patient had a PCN reaction that required hospitalization: unknown Has patient had a PCN reaction occurring within the last 10 years: No If all of the above answers are "NO", then may proceed with Cephalosporin  use. Unknown Think it was vomiting, NO anaphylaxis      Medication List    STOP taking these medications   amiloride-hydrochlorothiazide 5-50 MG tablet Commonly known as: MODURETIC   aspirin 325 MG EC tablet   methocarbamol 500 MG tablet Commonly known as: ROBAXIN    ondansetron 4 MG tablet Commonly known as: ZOFRAN   traMADol 50 MG tablet Commonly known as: ULTRAM     TAKE these medications   calcium carbonate 500 MG chewable tablet Commonly known as: TUMS - dosed in mg elemental calcium Chew 2 tablets by mouth 3 (three) times daily as needed for indigestion or heartburn.   ciprofloxacin 250 MG tablet Commonly known as: CIPRO Take 1 tablet (250 mg total) by mouth 2 (two) times daily for 5 days. What changed: additional instructions   clobetasol cream 0.05 % Commonly known as: TEMOVATE Apply 1 application topically 2 (two) times daily as needed. To external vulvar area for itching   HYDROcodone-acetaminophen 7.5-325 MG tablet Commonly known as: NORCO Take 1-2 tablets by mouth every 4 (four) hours as needed for severe pain.   metroNIDAZOLE 250 MG tablet Commonly known as: Flagyl Take 1 tablet (250 mg total) by mouth 3 (three) times daily for 5 days.   multivitamin with minerals Tabs tablet Take 1 tablet by mouth daily.   omeprazole 20 MG tablet Commonly known as: PRILOSEC OTC Take 20 mg by mouth daily as needed (acid reflux).   potassium chloride 10 MEQ tablet Commonly known as: KLOR-CON Take 10 mEq by mouth 2 (two) times daily.      Allergies  Allergen Reactions  . Other Other (See Comments)    CONTRAST DYE-unknown  . Demerol [Meperidine] Itching and Nausea And Vomiting  . Dilaudid [Hydromorphone Hcl] Itching and Nausea And Vomiting  . Macrodantin [Nitrofurantoin] Other (See Comments)    unknown  . Sulfa Antibiotics Other (See Comments)    unknown  . Zithromax [Azithromycin] Other (See Comments)    flu  . Chlorhexidine Gluconate Rash  . Penicillins Other (See Comments)    Has patient had a PCN reaction causing immediate rash, facial/tongue/throat swelling, SOB or lightheadedness with hypotension: NO Has patient had a PCN reaction causing severe rash involving mucus membranes or skin necrosis: unknown Has patient had a  PCN reaction that required hospitalization: unknown Has patient had a PCN reaction occurring within the last 10 years: No If all of the above answers are "NO", then may proceed with Cephalosporin use.  Unknown  Think it was vomiting, NO anaphylaxis    Follow-up Information    Gastroenterology, [facility]. Schedule an appointment as soon as possible for a visit in 2 week(s).   Why: colitis  Contact information: [place] [address] [phone]        Lavone Orn, MD. Schedule an appointment as soon as possible for a visit in 1 week(s).   Specialty: Internal Medicine Contact information: [address], [address] [place] [address] [phone]                The results of significant diagnostics from this hospitalization (including imaging, microbiology, ancillary and laboratory) are listed below for reference.    Significant Diagnostic Studies: CT Abdomen Pelvis W Contrast  Result Date: 04/21/2020 CLINICAL DATA:  Abdominal pain, rectal bleeding, diarrhea for 3 weeks EXAM: CT ABDOMEN AND PELVIS WITH CONTRAST TECHNIQUE: Multidetector CT imaging of the abdomen and pelvis was performed using the standard protocol following bolus administration of  intravenous contrast. CONTRAST:  138mL OMNIPAQUE IOHEXOL 300 MG/ML  SOLN COMPARISON:  None. FINDINGS: Lower chest: Moderate hiatal hernia with intrathoracic position of the gastric fundus. Scarring or atelectasis of the bilateral lung bases. Hepatobiliary: No solid liver abnormality is seen. Multiple small low and fluid attenuation liver lesions, likely small cysts and/or hemangiomata. No gallstones, gallbladder wall thickening, or biliary dilatation. Pancreas: Unremarkable. No pancreatic ductal dilatation or surrounding inflammatory changes. Spleen: Normal in size without significant abnormality. Adrenals/Urinary Tract: Adrenal glands are unremarkable. Kidneys are normal, without renal calculi, solid  lesion, or hydronephrosis. Mildly distended urinary bladder. Stomach/Bowel: Stomach is within normal limits. Appendix is not clearly visualized and may be surgically absent. Long segment wall thickening of the sigmoid colon (series 4, image 58). There does not appear to be significant underlying diverticular disease. Fluid in the rectum. Vascular/Lymphatic: Aortic atherosclerosis. No enlarged abdominal or pelvic lymph nodes. Reproductive: No mass or other significant abnormality. Other: No abdominal wall hernia or abnormality. No abdominopelvic ascites. Musculoskeletal: No acute or significant osseous findings. Status post bilateral hip total arthroplasty. IMPRESSION: 1. Long segment wall thickening of the sigmoid colon , consistent with nonspecific infectious, inflammatory, or ischemic colitis. There does not appear to be significant underlying diverticular disease. Findings are consistent with nonspecific infectious, inflammatory, or ischemic colitis, and without specific findings to localize nidus of GI bleeding. 2. Fluid in the rectum, consistent with diarrheal illness. 3. Moderate hiatal hernia with intrathoracic position of the gastric fundus. 4. Mildly distended urinary bladder. Correlate for urinary retention. Aortic Atherosclerosis (ICD10-I70.0). Electronically Signed   By: Eddie Candle M.D.   On: 04/21/2020 19:01    Microbiology: Recent Results (from the past 240 hour(s))  Resp Panel by RT-PCR (Flu A&B, Covid) Nasopharyngeal Swab     Status: None   Collection Time: 04/21/20  5:19 PM   Specimen: Nasopharyngeal Swab; Nasopharyngeal(NP) swabs in vial transport medium  Result Value Ref Range Status   SARS Coronavirus 2 by RT PCR NEGATIVE NEGATIVE Final    Comment: (NOTE) SARS-CoV-2 target nucleic acids are NOT DETECTED.  The SARS-CoV-2 RNA is generally detectable in upper respiratory specimens during the acute phase of infection. The lowest concentration of SARS-CoV-2 viral copies this assay can  detect is 138 copies/mL. A negative result does not preclude SARS-Cov-2 infection and should not be used as the sole basis for treatment or other patient management decisions. A negative result may occur with  improper specimen collection/handling, submission of specimen other than nasopharyngeal swab, presence of viral mutation(s) within the areas targeted by this assay, and inadequate number of viral copies(<138 copies/mL). A negative result must be combined with clinical observations, patient history, and epidemiological information. The expected result is Negative.  Fact Sheet for Patients:  EntrepreneurPulse.com.au  Fact Sheet for Healthcare Providers:  IncredibleEmployment.be  This test is no t yet approved or cleared by the [place] FDA and  has been authorized for detection and/or diagnosis of SARS-CoV-2 by FDA under an Emergency Use Authorization (EUA). This EUA will remain  in effect (meaning this test can be used) for the duration of the COVID-19 declaration under Section 564(b)(1) of the Act, 21 U.S.C.section 360bbb-3(b)(1), unless the authorization is terminated  or revoked sooner.       Influenza A by PCR NEGATIVE NEGATIVE Final   Influenza B by PCR NEGATIVE NEGATIVE Final    Comment: (NOTE) The Xpert Xpress SARS-CoV-2/FLU/RSV plus assay is intended as an aid in the diagnosis of influenza from Nasopharyngeal swab specimens and should not  be used as a sole basis for treatment. Nasal washings and aspirates are unacceptable for Xpert Xpress SARS-CoV-2/FLU/RSV testing.  Fact Sheet for Patients: EntrepreneurPulse.com.au  Fact Sheet for Healthcare Providers: IncredibleEmployment.be  This test is not yet approved or cleared by the [place] FDA and has been authorized for detection and/or diagnosis of SARS-CoV-2 by FDA under an Emergency Use Authorization (EUA). This EUA will remain in  effect (meaning this test can be used) for the duration of the COVID-19 declaration under Section 564(b)(1) of the Act, 21 U.S.C. section 360bbb-3(b)(1), unless the authorization is terminated or revoked.  Performed at [facility], [place] [address] [place], [place] [address]   Gastrointestinal Panel by PCR , Stool     Status: None   Collection Time: 04/21/20  9:56 PM   Specimen: Stool  Result Value Ref Range Status   Campylobacter species NOT DETECTED NOT DETECTED Final   Plesimonas shigelloides NOT DETECTED NOT DETECTED Final   Salmonella species NOT DETECTED NOT DETECTED Final   Yersinia enterocolitica NOT DETECTED NOT DETECTED Final   Vibrio species NOT DETECTED NOT DETECTED Final   Vibrio cholerae NOT DETECTED NOT DETECTED Final   Enteroaggregative E coli (EAEC) NOT DETECTED NOT DETECTED Final   Enteropathogenic E coli (EPEC) NOT DETECTED NOT DETECTED Final   Enterotoxigenic E coli (ETEC) NOT DETECTED NOT DETECTED Final   Shiga like toxin producing E coli (STEC) NOT DETECTED NOT DETECTED Final   Shigella/Enteroinvasive E coli (EIEC) NOT DETECTED NOT DETECTED Final   Cryptosporidium NOT DETECTED NOT DETECTED Final   Cyclospora cayetanensis NOT DETECTED NOT DETECTED Final   Entamoeba histolytica NOT DETECTED NOT DETECTED Final   Giardia lamblia NOT DETECTED NOT DETECTED Final   Adenovirus F40/41 NOT DETECTED NOT DETECTED Final   Astrovirus NOT DETECTED NOT DETECTED Final   Norovirus GI/GII NOT DETECTED NOT DETECTED Final   Rotavirus A NOT DETECTED NOT DETECTED Final   Sapovirus (I, II, IV, and V) NOT DETECTED NOT DETECTED Final    Comment: Performed at [facility], [address] [place], [place] [address]  C Difficile Quick Screen (NO PCR Reflex)     Status: None   Collection Time: 04/21/20  9:56 PM   Specimen: Stool  Result Value Ref Range Status   C Diff antigen NEGATIVE NEGATIVE Final   C Diff toxin NEGATIVE NEGATIVE Final   C Diff interpretation No C.  difficile detected.  Final    Comment: Performed at [facility], [place] [address] [place], [place] [address]     Labs: Basic Metabolic Panel: Recent Labs  Lab 04/21/20 1442 04/22/20 0353 04/23/20 0423  NA 124* 125* 126*  K 3.4* 3.2* 3.2*  CL 88* 92* 95*  CO2 21* 24 21*  GLUCOSE 117* 109* 113*  BUN 22 15 11   CREATININE 0.92 0.74 0.67  CALCIUM 9.1 8.5* 8.0*   Liver Function Tests: Recent Labs  Lab 04/21/20 1442 04/22/20 0353  AST 19 16  ALT 20 17  ALKPHOS 72 63  BILITOT 0.4 0.7  PROT 6.6 5.4*  ALBUMIN 2.8* 2.2*   Recent Labs  Lab 04/21/20 1442  LIPASE 17   No results for input(s): AMMONIA in the last 168 hours. CBC: Recent Labs  Lab 04/21/20 1442 04/21/20 2356 04/22/20 0353 04/22/20 1639 04/23/20 0423  WBC 10.1 8.8 8.1 9.6 7.5  HGB 11.0* 10.0* 9.6* 10.0* 9.4*  HCT 32.1* 28.9* 29.0* 29.7* 26.4*  MCV 90.9 92.0 92.9 91.4 89.5  PLT 400 327  338 397 373   Cardiac Enzymes: No results for input(s): CKTOTAL, CKMB, CKMBINDEX, TROPONINI in the last 168 hours. BNP: BNP (last 3 results) No results for input(s): BNP in the last 8760 hours.  ProBNP (last 3 results) No results for input(s): PROBNP in the last 8760 hours.  CBG: No results for input(s): GLUCAP in the last 168 hours.     Signed:  Domenic Polite MD.  [facility] 04/23/2020, 1:10 PM

## 2020-04-24 LAB — CBC
HCT: 27 % — ABNORMAL LOW (ref 36.0–46.0)
Hemoglobin: 9.5 g/dL — ABNORMAL LOW (ref 12.0–15.0)
MCH: 31.6 pg (ref 26.0–34.0)
MCHC: 35.2 g/dL (ref 30.0–36.0)
MCV: 89.7 fL (ref 80.0–100.0)
Platelets: 408 10*3/uL — ABNORMAL HIGH (ref 150–400)
RBC: 3.01 MIL/uL — ABNORMAL LOW (ref 3.87–5.11)
RDW: 12.8 % (ref 11.5–15.5)
WBC: 8.1 10*3/uL (ref 4.0–10.5)
nRBC: 0 % (ref 0.0–0.2)

## 2020-04-24 LAB — BASIC METABOLIC PANEL
Anion gap: 8 (ref 5–15)
BUN: 9 mg/dL (ref 8–23)
CO2: 23 mmol/L (ref 22–32)
Calcium: 7.9 mg/dL — ABNORMAL LOW (ref 8.9–10.3)
Chloride: 97 mmol/L — ABNORMAL LOW (ref 98–111)
Creatinine, Ser: 0.72 mg/dL (ref 0.44–1.00)
GFR, Estimated: >60 mL/min (ref >60)
Glucose, Bld: 117 mg/dL — ABNORMAL HIGH (ref 70–99)
Potassium: 3.5 mmol/L (ref 3.5–5.1)
Sodium: 128 mmol/L — ABNORMAL LOW (ref 135–145)

## 2020-04-24 MED ORDER — METRONIDAZOLE 500 MG PO TABS
500.0000 mg | ORAL_TABLET | Freq: Three times a day (TID) | ORAL | Status: DC
  Administered 2020-04-24 (×2): 500 mg via ORAL
  Filled 2020-04-24 (×3): qty 1

## 2020-04-24 MED ORDER — ACETAMINOPHEN 325 MG PO TABS: 650.0000 mg | ORAL_TABLET | Freq: Four times a day (QID) | ORAL | Status: DC | PRN

## 2020-04-24 MED ORDER — CIPROFLOXACIN HCL 500 MG PO TABS
500.0000 mg | ORAL_TABLET | Freq: Two times a day (BID) | ORAL | Status: DC
  Administered 2020-04-24 – 2020-04-25 (×3): 500 mg via ORAL
  Filled 2020-04-24 (×3): qty 1

## 2020-04-24 NOTE — H&P (View-Only) (Signed)
Discussed patient with Dr. Broadus John. Patient with continued rectal bleeding and clots.  CT on 04/21/20 with long segment wall thickening of sigmoid colon.  Discussed with Dr. Michail Sermon.  Plan for flexible sigmoidoscopy tomorrow for further evaluation.  Prep with 2 tap water enemas in the morning.  NPO after midnight.  Salley Slaughter, PA-C [facility] [phone]

## 2020-04-24 NOTE — Progress Notes (Signed)
Pharmacy Antibiotic Note  Crystal Ramsey is a 84 y.o. female admitted on 04/21/2020 with intra abdominal infection.  Pharmacy has been consulted for cipro dosing.  Planned discharged 12/12 but did not end up leaving. Discussed with MD to change antibiotics to PO given improvement, is eating and current IV antibiotic shortages. MD agreed.  Plan: Change to PO Cipro 500mg  q12 hr Change to PO Metronidazole 500mg  Q 8 hr 10 day total per GI - end 12/19  Height: 5' 0.98" (154.9 cm) Weight: 73 kg (160 lb 15 oz) IBW/kg (Calculated) : 47.76  Temp (24hrs), Avg:98.4 F (36.9 C), Min:98.1 F (36.7 C), Max:98.7 F (37.1 C)  Recent Labs  Lab 04/21/20 1442 04/21/20 2356 04/22/20 0353 04/22/20 1639 04/23/20 0423 04/24/20 0337  WBC 10.1 8.8 8.1 9.6 7.5 8.1  CREATININE 0.92  --  0.74  --  0.67 0.72    Estimated Creatinine Clearance: 49.6 mL/min (by C-G formula based on SCr of 0.72 mg/dL).    Allergies  Allergen Reactions  . Other Other (See Comments)    CONTRAST DYE-unknown  . Demerol [Meperidine] Itching and Nausea And Vomiting  . Dilaudid [Hydromorphone Hcl] Itching and Nausea And Vomiting  . Macrodantin [Nitrofurantoin] Other (See Comments)    unknown  . Sulfa Antibiotics Other (See Comments)    unknown  . Zithromax [Azithromycin] Other (See Comments)    flu  . Chlorhexidine Gluconate Rash  . Penicillins Other (See Comments)    Has patient had a PCN reaction causing immediate rash, facial/tongue/throat swelling, SOB or lightheadedness with hypotension: NO Has patient had a PCN reaction causing severe rash involving mucus membranes or skin necrosis: unknown Has patient had a PCN reaction that required hospitalization: unknown Has patient had a PCN reaction occurring within the last 10 years: No If all of the above answers are "NO", then may proceed with Cephalosporin use.  Unknown  Think it was vomiting, NO anaphylaxis     Thank you for allowing pharmacy to be a part of  this patient's care.  Benetta Spar, PharmD, BCPS, BCCP Clinical Pharmacist  Please check AMION for all [place] phone numbers After 10:00 PM, call [place] [phone]

## 2020-04-24 NOTE — Progress Notes (Signed)
Discussed patient with Dr. Broadus John. Patient with continued rectal bleeding and clots.  CT on 04/21/20 with long segment wall thickening of sigmoid colon.  Discussed with Dr. Michail Sermon.  Plan for flexible sigmoidoscopy tomorrow for further evaluation.  Prep with 2 tap water enemas in the morning.  NPO after midnight.  Salley Slaughter, PA-C [facility] [phone]

## 2020-04-24 NOTE — Progress Notes (Signed)
PROGRESS NOTE    Crystal Ramsey  JEH:[id number] DOB: September 09, 1935 DOA: 04/21/2020 PCP: Lavone Orn, MD  Brief Narrative: 84 year old female with history of DJD/chronic back pain, chronic constipation, GERD, developed diarrhea with bloody specks about 10 to 12 days ago while she was vacationing in [place], was seen in an ER there and decided to come back to [place], diarrhea improved briefly for couple days and then worsened again 3 days ago still with spikes/blobs of bright red blood.  -Then went to [facility] gastroenterology office, was advised to go to the ER -In the ED hemoglobin was around 10, sodium was low at 124, along with mild hypokalemia, CT abdomen pelvis noted thickening of the sigmoid colon, concerning for sigmoid colitis   Assessment & Plan:   Sigmoid colitis Persistent hematochezia -Clinically do not suspect ischemic colitis, denies any pain, no tenderness on exam -Symptoms ongoing for 2 weeks now -Continue ciprofloxacin/Flagyl -Continue IV fluids today -C. difficile PCR and GI pathogen panel are negative -Patient was adamant to go home yesterday but ended up staying due to recurrent bleeding, now with persistent BRBPR and clots, ask GI to re-assess -CBC in am  Chronic pain Osteoarthritis -Resume home regimen of hydrocodone  Hypokalemia -Replaced  Hyponatremia -Likely secondary to GI losses, hydrate and monitor -HCTZ on hold  DVT prophylaxis: SCDs Code Status: Full code Family Communication: Discussed with patient in detail, no family at bedside Disposition Plan:  Status is: Inpatient  Remains inpatient appropriate because:Inpatient level of care appropriate due to severity of illness   Dispo: The patient is from: Home              Anticipated d/c is to: Home              Anticipated d/c date is: 1-2 days              Patient currently is not medically stable to d/c.     Procedures:   Antimicrobials:    Subjective: -Diarrhea has improved,  continues to have moderate amounts of bright red blood with clots  Objective: Vitals:   04/23/20 0500 04/23/20 1412 04/23/20 2050 04/24/20 1454  BP: 138/73 139/73 136/74 139/72  Pulse: 85 95 93 95  Resp: 17 17 17 18   Temp: 97.9 F (36.6 C) 98.1 F (36.7 C) 98.7 F (37.1 C) 98.4 F (36.9 C)  TempSrc: Oral  Oral Oral  SpO2: 96% 95% 95% 96%  Weight:      Height:        Intake/Output Summary (Last 24 hours) at 04/24/2020 1506 Last data filed at 04/24/2020 1300 Gross per 24 hour  Intake 240 ml  Output --  Net 240 ml   Filed Weights   04/21/20 1431 04/22/20 1710  Weight: 73 kg 73 kg    Examination:  General exam: Pleasant elderly female sitting up in bed, AAOx3, no distress CVS: S1-S2, regular rate rhythm Lungs: Clear bilaterally Abdomen: Soft, nontender, mildly distended, bowel sounds present Extremities: No edema  Skin: No rashes on exposed skin  Psychiatry:. Mood & affect appropriate.     Data Reviewed:   CBC: Recent Labs  Lab 04/21/20 2356 04/22/20 0353 04/22/20 1639 04/23/20 0423 04/24/20 0337  WBC 8.8 8.1 9.6 7.5 8.1  HGB 10.0* 9.6* 10.0* 9.4* 9.5*  HCT 28.9* 29.0* 29.7* 26.4* 27.0*  MCV 92.0 92.9 91.4 89.5 89.7  PLT 327 338 397 373 378*   Basic Metabolic Panel: Recent Labs  Lab 04/21/20 1442 04/22/20 0353 04/23/20 0423 04/24/20 5885  NA 124* 125* 126* 128*  K 3.4* 3.2* 3.2* 3.5  CL 88* 92* 95* 97*  CO2 21* 24 21* 23  GLUCOSE 117* 109* 113* 117*  BUN 22 15 11 9   CREATININE 0.92 0.74 0.67 0.72  CALCIUM 9.1 8.5* 8.0* 7.9*   GFR: Estimated Creatinine Clearance: 49.6 mL/min (by C-G formula based on SCr of 0.72 mg/dL). Liver Function Tests: Recent Labs  Lab 04/21/20 1442 04/22/20 0353  AST 19 16  ALT 20 17  ALKPHOS 72 63  BILITOT 0.4 0.7  PROT 6.6 5.4*  ALBUMIN 2.8* 2.2*   Recent Labs  Lab 04/21/20 1442  LIPASE 17   No results for input(s): AMMONIA in the last 168 hours. Coagulation Profile: No results for input(s): INR,  PROTIME in the last 168 hours. Cardiac Enzymes: No results for input(s): CKTOTAL, CKMB, CKMBINDEX, TROPONINI in the last 168 hours. BNP (last 3 results) No results for input(s): PROBNP in the last 8760 hours. HbA1C: No results for input(s): HGBA1C in the last 72 hours. CBG: No results for input(s): GLUCAP in the last 168 hours. Lipid Profile: No results for input(s): CHOL, HDL, LDLCALC, TRIG, CHOLHDL, LDLDIRECT in the last 72 hours. Thyroid Function Tests: No results for input(s): TSH, T4TOTAL, FREET4, T3FREE, THYROIDAB in the last 72 hours. Anemia Panel: No results for input(s): VITAMINB12, FOLATE, FERRITIN, TIBC, IRON, RETICCTPCT in the last 72 hours. Urine analysis:    Component Value Date/Time   COLORURINE YELLOW 12/07/2015 1020   APPEARANCEUR CLEAR 12/07/2015 1020   LABSPEC 1.012 12/07/2015 1020   PHURINE 6.5 12/07/2015 1020   GLUCOSEU NEGATIVE 12/07/2015 1020   HGBUR NEGATIVE 12/07/2015 1020   BILIRUBINUR NEGATIVE 12/07/2015 1020   KETONESUR NEGATIVE 12/07/2015 1020   PROTEINUR NEGATIVE 12/07/2015 1020   UROBILINOGEN 0.2 08/31/2014 1420   NITRITE NEGATIVE 12/07/2015 1020   LEUKOCYTESUR TRACE (A) 12/07/2015 1020   Sepsis Labs: @LABRCNTIP (procalcitonin:4,lacticidven:4)  ) Recent Results (from the past 240 hour(s))  Resp Panel by RT-PCR (Flu A&B, Covid) Nasopharyngeal Swab     Status: None   Collection Time: 04/21/20  5:19 PM   Specimen: Nasopharyngeal Swab; Nasopharyngeal(NP) swabs in vial transport medium  Result Value Ref Range Status   SARS Coronavirus 2 by RT PCR NEGATIVE NEGATIVE Final    Comment: (NOTE) SARS-CoV-2 target nucleic acids are NOT DETECTED.  The SARS-CoV-2 RNA is generally detectable in upper respiratory specimens during the acute phase of infection. The lowest concentration of SARS-CoV-2 viral copies this assay can detect is 138 copies/mL. A negative result does not preclude SARS-Cov-2 infection and should not be used as the sole basis for  treatment or other patient management decisions. A negative result may occur with  improper specimen collection/handling, submission of specimen other than nasopharyngeal swab, presence of viral mutation(s) within the areas targeted by this assay, and inadequate number of viral copies(<138 copies/mL). A negative result must be combined with clinical observations, patient history, and epidemiological information. The expected result is Negative.  Fact Sheet for Patients:  EntrepreneurPulse.com.au  Fact Sheet for Healthcare Providers:  IncredibleEmployment.be  This test is no t yet approved or cleared by the [place] FDA and  has been authorized for detection and/or diagnosis of SARS-CoV-2 by FDA under an Emergency Use Authorization (EUA). This EUA will remain  in effect (meaning this test can be used) for the duration of the COVID-19 declaration under Section 564(b)(1) of the Act, 21 U.S.C.section 360bbb-3(b)(1), unless the authorization is terminated  or revoked sooner.  Influenza A by PCR NEGATIVE NEGATIVE Final   Influenza B by PCR NEGATIVE NEGATIVE Final    Comment: (NOTE) The Xpert Xpress SARS-CoV-2/FLU/RSV plus assay is intended as an aid in the diagnosis of influenza from Nasopharyngeal swab specimens and should not be used as a sole basis for treatment. Nasal washings and aspirates are unacceptable for Xpert Xpress SARS-CoV-2/FLU/RSV testing.  Fact Sheet for Patients: EntrepreneurPulse.com.au  Fact Sheet for Healthcare Providers: IncredibleEmployment.be  This test is not yet approved or cleared by the [place] FDA and has been authorized for detection and/or diagnosis of SARS-CoV-2 by FDA under an Emergency Use Authorization (EUA). This EUA will remain in effect (meaning this test can be used) for the duration of the COVID-19 declaration under Section 564(b)(1) of the Act, 21  U.S.C. section 360bbb-3(b)(1), unless the authorization is terminated or revoked.  Performed at [facility], [place] [address] [place], [place] [address]   Gastrointestinal Panel by PCR , Stool     Status: None   Collection Time: 04/21/20  9:56 PM   Specimen: Stool  Result Value Ref Range Status   Campylobacter species NOT DETECTED NOT DETECTED Final   Plesimonas shigelloides NOT DETECTED NOT DETECTED Final   Salmonella species NOT DETECTED NOT DETECTED Final   Yersinia enterocolitica NOT DETECTED NOT DETECTED Final   Vibrio species NOT DETECTED NOT DETECTED Final   Vibrio cholerae NOT DETECTED NOT DETECTED Final   Enteroaggregative E coli (EAEC) NOT DETECTED NOT DETECTED Final   Enteropathogenic E coli (EPEC) NOT DETECTED NOT DETECTED Final   Enterotoxigenic E coli (ETEC) NOT DETECTED NOT DETECTED Final   Shiga like toxin producing E coli (STEC) NOT DETECTED NOT DETECTED Final   Shigella/Enteroinvasive E coli (EIEC) NOT DETECTED NOT DETECTED Final   Cryptosporidium NOT DETECTED NOT DETECTED Final   Cyclospora cayetanensis NOT DETECTED NOT DETECTED Final   Entamoeba histolytica NOT DETECTED NOT DETECTED Final   Giardia lamblia NOT DETECTED NOT DETECTED Final   Adenovirus F40/41 NOT DETECTED NOT DETECTED Final   Astrovirus NOT DETECTED NOT DETECTED Final   Norovirus GI/GII NOT DETECTED NOT DETECTED Final   Rotavirus A NOT DETECTED NOT DETECTED Final   Sapovirus (I, II, IV, and V) NOT DETECTED NOT DETECTED Final    Comment: Performed at [facility], [address] [place], [place] [address]  C Difficile Quick Screen (NO PCR Reflex)     Status: None   Collection Time: 04/21/20  9:56 PM   Specimen: Stool  Result Value Ref Range Status   C Diff antigen NEGATIVE NEGATIVE Final   C Diff toxin NEGATIVE NEGATIVE Final   C Diff interpretation No C. difficile detected.  Final    Comment: Performed at [facility], [place] [address] [place], [place] [address]          Radiology Studies: No results found.      Scheduled Meds: . ciprofloxacin  500 mg Oral BID  . metroNIDAZOLE  500 mg Oral Q8H   Continuous Infusions: . sodium chloride 75 mL/hr at 04/24/20 0546     LOS: 3 days    Time spent: 32min  Domenic Polite, MD [facility]  04/24/2020, 3:06 PM

## 2020-04-25 ENCOUNTER — Encounter (HOSPITAL_COMMUNITY)
Admission: EM | Disposition: A | Discharge status: Home / Self Care | Payer: Self-pay | Source: Home / Self Care | Attending: Internal Medicine

## 2020-04-25 ENCOUNTER — Encounter (HOSPITAL_COMMUNITY): Payer: Self-pay | Admitting: Gastroenterology

## 2020-04-25 ENCOUNTER — Inpatient Hospital Stay (HOSPITAL_COMMUNITY): Payer: Medicare Other | Admitting: Certified Registered Nurse Anesthetist

## 2020-04-25 HISTORY — PX: BIOPSY: SHX5522

## 2020-04-25 HISTORY — PX: FLEXIBLE SIGMOIDOSCOPY: SHX5431

## 2020-04-25 LAB — BASIC METABOLIC PANEL
Anion gap: 11 (ref 5–15)
BUN: 13 mg/dL (ref 8–23)
CO2: 22 mmol/L (ref 22–32)
Calcium: 8 mg/dL — ABNORMAL LOW (ref 8.9–10.3)
Chloride: 97 mmol/L — ABNORMAL LOW (ref 98–111)
Creatinine, Ser: 0.84 mg/dL (ref 0.44–1.00)
GFR, Estimated: >60 mL/min (ref >60)
Glucose, Bld: 114 mg/dL — ABNORMAL HIGH (ref 70–99)
Potassium: 3.4 mmol/L — ABNORMAL LOW (ref 3.5–5.1)
Sodium: 130 mmol/L — ABNORMAL LOW (ref 135–145)

## 2020-04-25 LAB — CBC
HCT: 27.1 % — ABNORMAL LOW (ref 36.0–46.0)
Hemoglobin: 9.5 g/dL — ABNORMAL LOW (ref 12.0–15.0)
MCH: 31.7 pg (ref 26.0–34.0)
MCHC: 35.1 g/dL (ref 30.0–36.0)
MCV: 90.3 fL (ref 80.0–100.0)
Platelets: 421 10*3/uL — ABNORMAL HIGH (ref 150–400)
RBC: 3 MIL/uL — ABNORMAL LOW (ref 3.87–5.11)
RDW: 13 % (ref 11.5–15.5)
WBC: 8 10*3/uL (ref 4.0–10.5)
nRBC: 0 % (ref 0.0–0.2)

## 2020-04-25 LAB — LACTIC ACID, PLASMA: Lactic Acid, Venous: 1 mmol/L (ref 0.5–1.9)

## 2020-04-25 SURGERY — SIGMOIDOSCOPY, FLEXIBLE
Anesthesia: Monitor Anesthesia Care

## 2020-04-25 MED ORDER — PROPOFOL 10 MG/ML IV BOLUS
INTRAVENOUS | Status: DC | PRN
  Administered 2020-04-25: 30 mg via INTRAVENOUS
  Administered 2020-04-25: 20 mg via INTRAVENOUS

## 2020-04-25 MED ORDER — POTASSIUM CHLORIDE CRYS ER 20 MEQ PO TBCR
40.0000 meq | EXTENDED_RELEASE_TABLET | Freq: Once | ORAL | Status: AC
  Administered 2020-04-25: 15:00:00 40 meq via ORAL
  Filled 2020-04-25: qty 2

## 2020-04-25 MED ORDER — METRONIDAZOLE IVPB CUSTOM
250.0000 mg | Freq: Three times a day (TID) | INTRAVENOUS | Status: DC
  Administered 2020-04-25 – 2020-04-28 (×9): 250 mg via INTRAVENOUS
  Filled 2020-04-25: qty 100
  Filled 2020-04-25: qty 50
  Filled 2020-04-25 (×2): qty 100
  Filled 2020-04-25 (×4): qty 50
  Filled 2020-04-25: qty 100
  Filled 2020-04-25 (×5): qty 50

## 2020-04-25 MED ORDER — SODIUM CHLORIDE 0.9 % IV SOLN: INTRAVENOUS | Status: DC

## 2020-04-25 MED ORDER — LACTATED RINGERS IV SOLN: INTRAVENOUS | Status: DC | PRN

## 2020-04-25 MED ORDER — PROPOFOL 500 MG/50ML IV EMUL
INTRAVENOUS | Status: DC | PRN
  Administered 2020-04-25: 100 ug/kg/min via INTRAVENOUS

## 2020-04-25 MED ORDER — CIPROFLOXACIN IN D5W 200 MG/100ML IV SOLN
200.0000 mg | Freq: Two times a day (BID) | INTRAVENOUS | Status: DC
  Administered 2020-04-25 – 2020-04-26 (×2): 200 mg via INTRAVENOUS
  Filled 2020-04-25 (×3): qty 100

## 2020-04-25 NOTE — Anesthesia Procedure Notes (Signed)
Procedure Name: MAC Date/Time: 04/25/2020 9:00 AM Performed by: Harden Mo, CRNA Pre-anesthesia Checklist: Patient identified, Emergency Drugs available, Suction available and Patient being monitored Patient Re-evaluated:Patient Re-evaluated prior to induction Oxygen Delivery Method: Simple face mask Preoxygenation: Pre-oxygenation with 100% oxygen Induction Type: IV induction Placement Confirmation: positive ETCO2 and breath sounds checked- equal and bilateral Dental Injury: Teeth and Oropharynx as per pre-operative assessment

## 2020-04-25 NOTE — Transfer of Care (Signed)
Immediate Anesthesia Transfer of Care Note  Patient: Crystal Ramsey  Procedure(s) Performed: FLEXIBLE SIGMOIDOSCOPY (N/A )  Patient Location: Endoscopy Unit  Anesthesia Type:MAC  Level of Consciousness: awake and alert   Airway & Oxygen Therapy: Patient Spontanous Breathing  Post-op Assessment: Report given to RN and Post -op Vital signs reviewed and stable  Post vital signs: Reviewed and stable  Last Vitals:  Vitals Value Taken Time  BP    Temp    Pulse    Resp    SpO2      Last Pain:  Vitals:   04/25/20 0755  TempSrc: Temporal  PainSc: 0-No pain      Patients Stated Pain Goal: 1 (49/67/59 1638)  Complications: No complications documented.

## 2020-04-25 NOTE — Anesthesia Preprocedure Evaluation (Signed)
Anesthesia Evaluation  Patient identified by MRN, date of birth, ID band Patient awake    Reviewed: Allergy & Precautions, H&P , NPO status , Patient's Chart, lab work & pertinent test results  History of Anesthesia Complications (+) PONV and history of anesthetic complications  Airway Mallampati: II   Neck ROM: full    Dental   Pulmonary former smoker,    breath sounds clear to auscultation       Cardiovascular hypertension,  Rhythm:regular Rate:Normal     Neuro/Psych    GI/Hepatic GERD  ,Rectal bleeding   Endo/Other    Renal/GU      Musculoskeletal  (+) Arthritis , Fibromyalgia -  Abdominal   Peds  Hematology  (+) anemia ,   Anesthesia Other Findings   Reproductive/Obstetrics                             Anesthesia Physical Anesthesia Plan  ASA: II  Anesthesia Plan: MAC   Post-op Pain Management:    Induction: Intravenous  PONV Risk Score and Plan: 3 and Propofol infusion and Treatment may vary due to age or medical condition  Airway Management Planned: Simple Face Mask  Additional Equipment:   Intra-op Plan:   Post-operative Plan:   Informed Consent: I have reviewed the patients History and Physical, chart, labs and discussed the procedure including the risks, benefits and alternatives for the proposed anesthesia with the patient or authorized representative who has indicated his/her understanding and acceptance.       Plan Discussed with: CRNA, Anesthesiologist and Surgeon  Anesthesia Plan Comments:         Anesthesia Quick Evaluation

## 2020-04-25 NOTE — Op Note (Signed)
[facility] Patient Name: Crystal Ramsey Procedure Date : 04/25/2020 MRN: [id number] Attending MD: [name] , MD Date of Birth: 05-22-1935 CSN: [id number] Age: 84 Admit Type: Inpatient Procedure:                Flexible Sigmoidoscopy Indications:              Hematochezia, Diarrhea Providers:                [name], MD, Kary Kos RN, RN,                            Janee Morn, Technician Referring MD:             hospital team Medicines:                Monitored Anesthesia Care, Propofol per Anesthesia Complications:            No immediate complications. Estimated Blood Loss:     Estimated blood loss was minimal. Procedure:                Pre-Anesthesia Assessment:                           - Prior to the procedure, a History and Physical                            was performed, and patient medications and                            allergies were reviewed. The patient's tolerance of                            previous anesthesia was also reviewed. The risks                            and benefits of the procedure and the sedation                            options and risks were discussed with the patient.                            All questions were answered, and informed consent                            was obtained. Prior Anticoagulants: The patient has                            taken no previous anticoagulant or antiplatelet                            agents. ASA Grade Assessment: II - A patient with                            mild systemic disease. After reviewing the risks  and benefits, the patient was deemed in                            satisfactory condition to undergo the procedure.                           After obtaining informed consent, the scope was                            passed under direct vision. The PCF-[id number]                            ([id number]) Olympus pediatric colonoscope was                             introduced through the anus and advanced to the the                            descending colon. The flexible sigmoidoscopy was                            somewhat difficult due to bowel stenosis.                            Successful completion of the procedure was aided by                            straightening and shortening the scope to obtain                            bowel loop reduction. The quality of the bowel                            preparation was fair. Scope In: 9:08:24 AM Scope Out: 9:21:51 AM Total Procedure Duration: 0 hours 13 minutes 27 seconds  Findings:      The perianal and digital rectal examinations were normal.      A diffuse area of severely congested, erythematous, eroded, friable       (with spontaneous bleeding), granular, hemorrhagic, inflamed, nodular,       ulcerated, vascular-pattern-decreased and thickened folds of the mucosa       was found in the rectum, in the recto-sigmoid colon and in the sigmoid       colon. Inflammation extended from 0-25 cm from the anus. Biopsies were       taken with a cold forceps for histology. Estimated blood loss was       minimal.      Retroflexion in the rectum was not performed due to rectal inflammation. Impression:               - Preparation of the colon was fair.                           - Congested, erythematous, eroded, friable (with  spontaneous bleeding), granular, hemorrhagic,                            inflamed, nodular, ulcerated,                            vascular-pattern-decreased and thickened folds of                            the mucosa in the rectum, in the recto-sigmoid                            colon and in the sigmoid colon. Biopsied.                           - Endoscopic findings concerning for ischemic                            colitis but ulcerative colitis also in differential. Recommendation:           - Await pathology results.                            - Clear liquid diet. Procedure Code(s):        --- Professional ---                           [phone], Sigmoidoscopy, flexible; with biopsy, single                            or multiple Diagnosis Code(s):        --- Professional ---                           K92.1, Melena (includes Hematochezia)                           K63.3, Ulcer of intestine                           K62.5, Hemorrhage of anus and rectum                           K62.6, Ulcer of anus and rectum                           K92.2, Gastrointestinal hemorrhage, unspecified                           K52.9, Noninfective gastroenteritis and colitis,                            unspecified                           K62.89, Other specified diseases of anus and rectum  K63.89, Other specified diseases of intestine                           R19.7, Diarrhea, unspecified CPT copyright 2019 American Medical Association. All rights reserved. The codes documented in this report are preliminary and upon coder review may  be revised to meet current compliance requirements. [name], MD 04/25/2020 9:33:57 AM This report has been signed electronically. Number of Addenda: 0

## 2020-04-25 NOTE — Plan of Care (Signed)
  Problem: Education: Goal: Knowledge of General Education information will improve Description Including pain rating scale, medication(s)/side effects and non-pharmacologic comfort measures Outcome: Progressing   

## 2020-04-25 NOTE — Plan of Care (Signed)
  Problem: Education: Goal: Knowledge of General Education information will improve Description: Including pain rating scale, medication(s)/side effects and non-pharmacologic comfort measures 04/25/2020 2202 by Heloise Purpura, RN Outcome: Progressing 04/25/2020 2202 by Heloise Purpura, RN Outcome: Progressing

## 2020-04-25 NOTE — Progress Notes (Addendum)
PROGRESS NOTE    Crystal Ramsey  [id number]:[id number] DOB: 1935-06-19 DOA: 04/21/2020 PCP: Lavone Orn, MD  Brief Narrative: 84 year old female with history of DJD/chronic back pain, chronic constipation, GERD, developed diarrhea with bloody specks about 10 to 12 days ago while she was vacationing in [place], was seen in an ER there and decided to come back to [place], diarrhea improved briefly for couple days and then worsened again 3 days ago still with specks of bright red blood.  -Then went to [facility] gastroenterology office, was advised to go to the ER -In the ED hemoglobin was around 10, sodium was low at 124, along with mild hypokalemia, CT abdomen pelvis noted thickening of the sigmoid colon, concerning for sigmoid colitis   Assessment & Plan:   Sigmoid colitis Hematochezia -Presented with painless hematochezia and diarrhea, CT concerning for sigmoid colitis  -C. difficile PCR and GI pathogen panel are negative -Continue IV Cipro and Flagyl -GI reconsulted due to persistent bleeding, flexible sigmoidoscopy today -Hemoglobin is stable, monitor  Chronic pain Osteoarthritis -Resumed home regimen of hydrocodone  Hypokalemia -Replaced  Hyponatremia -Likely secondary to GI losses, HCTZ -HCTZ on hold -improving, continue gentle IVF  DVT prophylaxis: SCDs Code Status: Full code Family Communication: Discussed with patient in detail, no family at bedside Disposition Plan:  Status is: Inpatient  Remains inpatient appropriate because:Inpatient level of care appropriate due to severity of illness   Dispo: The patient is from: Home              Anticipated d/c is to: Home              Anticipated d/c date is: 2-3 days              Patient currently is not medically stable to d/c.     Procedures: Flex sigmoidoscopy today  Antimicrobials:    Subjective: -Still with diarrhea, scant amounts of blood, denies any pain  Objective: Vitals:   04/25/20 0929 04/25/20  0940 04/25/20 0950 04/25/20 1018  BP: (!) 157/85 (!) 159/80 (!) 176/80 (!) 174/80  Pulse: 93 86 81 89  Resp: 15 16 15 20   Temp: 97.7 F (36.5 C)   97.8 F (36.6 C)  TempSrc: Oral   Oral  SpO2: 97% 97% 95% 94%  Weight:      Height:        Intake/Output Summary (Last 24 hours) at 04/25/2020 1426 Last data filed at 04/24/2020 1700 Gross per 24 hour  Intake 120 ml  Output --  Net 120 ml   Filed Weights   04/21/20 1431 04/22/20 1710  Weight: 73 kg 73 kg    Examination:  General exam: Pleasant elderly female laying in bed, AAOx3, no distress CVS: S1-S2, regular rate rhythm Lungs: Decreased breath sounds to bases, otherwise clear Abdomen: Soft, nontender, mildly distended, bowel sounds present Extremities: No edema  Skin: No rashes on exposed skin  Psychiatry:. Mood & affect appropriate.     Data Reviewed:   CBC: Recent Labs  Lab 04/22/20 0353 04/22/20 1639 04/23/20 0423 04/24/20 0337 04/25/20 0143  WBC 8.1 9.6 7.5 8.1 8.0  HGB 9.6* 10.0* 9.4* 9.5* 9.5*  HCT 29.0* 29.7* 26.4* 27.0* 27.1*  MCV 92.9 91.4 89.5 89.7 90.3  PLT 338 397 373 408* 448*   Basic Metabolic Panel: Recent Labs  Lab 04/21/20 1442 04/22/20 0353 04/23/20 0423 04/24/20 0337 04/25/20 0143  NA 124* 125* 126* 128* 130*  K 3.4* 3.2* 3.2* 3.5 3.4*  CL 88* 92* 95* 97*  97*  CO2 21* 24 21* 23 22  GLUCOSE 117* 109* 113* 117* 114*  BUN 22 15 11 9 13   CREATININE 0.92 0.74 0.67 0.72 0.84  CALCIUM 9.1 8.5* 8.0* 7.9* 8.0*   GFR: Estimated Creatinine Clearance: 47.2 mL/min (by C-G formula based on SCr of 0.84 mg/dL). Liver Function Tests: Recent Labs  Lab 04/21/20 1442 04/22/20 0353  AST 19 16  ALT 20 17  ALKPHOS 72 63  BILITOT 0.4 0.7  PROT 6.6 5.4*  ALBUMIN 2.8* 2.2*   Recent Labs  Lab 04/21/20 1442  LIPASE 17   No results for input(s): AMMONIA in the last 168 hours. Coagulation Profile: No results for input(s): INR, PROTIME in the last 168 hours. Cardiac Enzymes: No results for  input(s): CKTOTAL, CKMB, CKMBINDEX, TROPONINI in the last 168 hours. BNP (last 3 results) No results for input(s): PROBNP in the last 8760 hours. HbA1C: No results for input(s): HGBA1C in the last 72 hours. CBG: No results for input(s): GLUCAP in the last 168 hours. Lipid Profile: No results for input(s): CHOL, HDL, LDLCALC, TRIG, CHOLHDL, LDLDIRECT in the last 72 hours. Thyroid Function Tests: No results for input(s): TSH, T4TOTAL, FREET4, T3FREE, THYROIDAB in the last 72 hours. Anemia Panel: No results for input(s): VITAMINB12, FOLATE, FERRITIN, TIBC, IRON, RETICCTPCT in the last 72 hours. Urine analysis:    Component Value Date/Time   COLORURINE YELLOW 12/07/2015 1020   APPEARANCEUR CLEAR 12/07/2015 1020   LABSPEC 1.012 12/07/2015 1020   PHURINE 6.5 12/07/2015 1020   GLUCOSEU NEGATIVE 12/07/2015 1020   HGBUR NEGATIVE 12/07/2015 1020   BILIRUBINUR NEGATIVE 12/07/2015 1020   KETONESUR NEGATIVE 12/07/2015 1020   PROTEINUR NEGATIVE 12/07/2015 1020   UROBILINOGEN 0.2 08/31/2014 1420   NITRITE NEGATIVE 12/07/2015 1020   LEUKOCYTESUR TRACE (A) 12/07/2015 1020   Sepsis Labs: @LABRCNTIP (procalcitonin:4,lacticidven:4)  ) Recent Results (from the past 240 hour(s))  Resp Panel by RT-PCR (Flu A&B, Covid) Nasopharyngeal Swab     Status: None   Collection Time: 04/21/20  5:19 PM   Specimen: Nasopharyngeal Swab; Nasopharyngeal(NP) swabs in vial transport medium  Result Value Ref Range Status   SARS Coronavirus 2 by RT PCR NEGATIVE NEGATIVE Final    Comment: (NOTE) SARS-CoV-2 target nucleic acids are NOT DETECTED.  The SARS-CoV-2 RNA is generally detectable in upper respiratory specimens during the acute phase of infection. The lowest concentration of SARS-CoV-2 viral copies this assay can detect is 138 copies/mL. A negative result does not preclude SARS-Cov-2 infection and should not be used as the sole basis for treatment or other patient management decisions. A negative result may  occur with  improper specimen collection/handling, submission of specimen other than nasopharyngeal swab, presence of viral mutation(s) within the areas targeted by this assay, and inadequate number of viral copies(<138 copies/mL). A negative result must be combined with clinical observations, patient history, and epidemiological information. The expected result is Negative.  Fact Sheet for Patients:  EntrepreneurPulse.com.au  Fact Sheet for Healthcare Providers:  IncredibleEmployment.be  This test is no t yet approved or cleared by the [place] FDA and  has been authorized for detection and/or diagnosis of SARS-CoV-2 by FDA under an Emergency Use Authorization (EUA). This EUA will remain  in effect (meaning this test can be used) for the duration of the COVID-19 declaration under Section 564(b)(1) of the Act, 21 U.S.C.section 360bbb-3(b)(1), unless the authorization is terminated  or revoked sooner.       Influenza A by PCR NEGATIVE NEGATIVE Final   Influenza B  by PCR NEGATIVE NEGATIVE Final    Comment: (NOTE) The Xpert Xpress SARS-CoV-2/FLU/RSV plus assay is intended as an aid in the diagnosis of influenza from Nasopharyngeal swab specimens and should not be used as a sole basis for treatment. Nasal washings and aspirates are unacceptable for Xpert Xpress SARS-CoV-2/FLU/RSV testing.  Fact Sheet for Patients: EntrepreneurPulse.com.au  Fact Sheet for Healthcare Providers: IncredibleEmployment.be  This test is not yet approved or cleared by the [place] FDA and has been authorized for detection and/or diagnosis of SARS-CoV-2 by FDA under an Emergency Use Authorization (EUA). This EUA will remain in effect (meaning this test can be used) for the duration of the COVID-19 declaration under Section 564(b)(1) of the Act, 21 U.S.C. section 360bbb-3(b)(1), unless the authorization is terminated  or revoked.  Performed at [facility], [place] [address], [place], [place] [address]   Gastrointestinal Panel by PCR , Stool     Status: None   Collection Time: 04/21/20  9:56 PM   Specimen: Stool  Result Value Ref Range Status   Campylobacter species NOT DETECTED NOT DETECTED Final   Plesimonas shigelloides NOT DETECTED NOT DETECTED Final   Salmonella species NOT DETECTED NOT DETECTED Final   Yersinia enterocolitica NOT DETECTED NOT DETECTED Final   Vibrio species NOT DETECTED NOT DETECTED Final   Vibrio cholerae NOT DETECTED NOT DETECTED Final   Enteroaggregative E coli (EAEC) NOT DETECTED NOT DETECTED Final   Enteropathogenic E coli (EPEC) NOT DETECTED NOT DETECTED Final   Enterotoxigenic E coli (ETEC) NOT DETECTED NOT DETECTED Final   Shiga like toxin producing E coli (STEC) NOT DETECTED NOT DETECTED Final   Shigella/Enteroinvasive E coli (EIEC) NOT DETECTED NOT DETECTED Final   Cryptosporidium NOT DETECTED NOT DETECTED Final   Cyclospora cayetanensis NOT DETECTED NOT DETECTED Final   Entamoeba histolytica NOT DETECTED NOT DETECTED Final   Giardia lamblia NOT DETECTED NOT DETECTED Final   Adenovirus F40/41 NOT DETECTED NOT DETECTED Final   Astrovirus NOT DETECTED NOT DETECTED Final   Norovirus GI/GII NOT DETECTED NOT DETECTED Final   Rotavirus A NOT DETECTED NOT DETECTED Final   Sapovirus (I, II, IV, and V) NOT DETECTED NOT DETECTED Final    Comment: Performed at [facility], [address] [place], [place] [address]  C Difficile Quick Screen (NO PCR Reflex)     Status: None   Collection Time: 04/21/20  9:56 PM   Specimen: Stool  Result Value Ref Range Status   C Diff antigen NEGATIVE NEGATIVE Final   C Diff toxin NEGATIVE NEGATIVE Final   C Diff interpretation No C. difficile detected.  Final    Comment: Performed at [facility], [place] [address] [place], [place] [address]    Scheduled Meds:  Continuous Infusions: . sodium chloride 50 mL/hr at  04/24/20 1531  . ciprofloxacin    . metronidazole       LOS: 4 days    Time spent: 28min  Domenic Polite, MD [facility]  04/25/2020, 2:26 PM

## 2020-04-25 NOTE — Progress Notes (Signed)
Patient off the unit to ENDO for procedure.

## 2020-04-25 NOTE — Brief Op Note (Signed)
Severe diffuse inflammation that is most severe in the sigmoid colon. Granular appearance in rectum suggests ulcerative colitis but worsened severity throughout sigmoid colon compared to other areas concerning for ischemic colitis. Continue IV Abx, IVFs, supportive care. Biopsies pending. Will check lactic acid. If symptoms worsen, then may need surgical consult. Clear liquids ok. D/W Dr. Broadus John.

## 2020-04-25 NOTE — Interval H&P Note (Signed)
History and Physical Interval Note:  04/25/2020 8:51 AM  Crystal Ramsey  has presented today for surgery, with the diagnosis of rectal bleeding, abnormal CT of sigmoid colon.  The various methods of treatment have been discussed with the patient and family. After consideration of risks, benefits and other options for treatment, the patient has consented to  Procedure(s): FLEXIBLE SIGMOIDOSCOPY (N/A) as a surgical intervention.  The patient's history has been reviewed, patient examined, no change in status, stable for surgery.  I have reviewed the patient's chart and labs.  Questions were answered to the patient's satisfaction.     [name]

## 2020-04-25 NOTE — Progress Notes (Signed)
Patient returned to unit alert and oriented, no s/s of distress.

## 2020-04-26 DIAGNOSIS — K5190 Ulcerative colitis, unspecified, without complications: Secondary | ICD-10-CM

## 2020-04-26 LAB — CBC
HCT: 26.3 % — ABNORMAL LOW (ref 36.0–46.0)
Hemoglobin: 9.2 g/dL — ABNORMAL LOW (ref 12.0–15.0)
MCH: 31.8 pg (ref 26.0–34.0)
MCHC: 35 g/dL (ref 30.0–36.0)
MCV: 91 fL (ref 80.0–100.0)
Platelets: 418 10*3/uL — ABNORMAL HIGH (ref 150–400)
RBC: 2.89 MIL/uL — ABNORMAL LOW (ref 3.87–5.11)
RDW: 13.1 % (ref 11.5–15.5)
WBC: 8 10*3/uL (ref 4.0–10.5)
nRBC: 0 % (ref 0.0–0.2)

## 2020-04-26 LAB — SURGICAL PATHOLOGY

## 2020-04-26 LAB — BASIC METABOLIC PANEL
Anion gap: 8 (ref 5–15)
BUN: 9 mg/dL (ref 8–23)
CO2: 23 mmol/L (ref 22–32)
Calcium: 7.9 mg/dL — ABNORMAL LOW (ref 8.9–10.3)
Chloride: 100 mmol/L (ref 98–111)
Creatinine, Ser: 0.6 mg/dL (ref 0.44–1.00)
GFR, Estimated: >60 mL/min (ref >60)
Glucose, Bld: 100 mg/dL — ABNORMAL HIGH (ref 70–99)
Potassium: 3.8 mmol/L (ref 3.5–5.1)
Sodium: 131 mmol/L — ABNORMAL LOW (ref 135–145)

## 2020-04-26 MED ORDER — METHYLPREDNISOLONE SODIUM SUCC 125 MG IJ SOLR
60.0000 mg | Freq: Two times a day (BID) | INTRAMUSCULAR | Status: AC
Start: 2020-04-26 — End: 2020-04-28
  Administered 2020-04-26 – 2020-04-28 (×5): 60 mg via INTRAVENOUS
  Filled 2020-04-26 (×6): qty 2

## 2020-04-26 MED ORDER — GERHARDT'S BUTT CREAM
TOPICAL_CREAM | Freq: Four times a day (QID) | CUTANEOUS | Status: DC
  Administered 2020-04-28 – 2020-04-29 (×2): 1 via TOPICAL
  Filled 2020-04-26 (×2): qty 1

## 2020-04-26 MED ORDER — PANTOPRAZOLE SODIUM 40 MG IV SOLR
40.0000 mg | Freq: Two times a day (BID) | INTRAVENOUS | Status: DC
  Administered 2020-04-26 – 2020-04-30 (×9): 40 mg via INTRAVENOUS
  Filled 2020-04-26 (×9): qty 40

## 2020-04-26 MED ORDER — HYDRALAZINE HCL 25 MG PO TABS
25.0000 mg | ORAL_TABLET | Freq: Three times a day (TID) | ORAL | Status: DC | PRN
  Administered 2020-04-29: 04:00:00 25 mg via ORAL
  Filled 2020-04-26: qty 1

## 2020-04-26 NOTE — Anesthesia Postprocedure Evaluation (Signed)
Anesthesia Post Note  Patient: Crystal Ramsey  Procedure(s) Performed: FLEXIBLE SIGMOIDOSCOPY (N/A ) BIOPSY     Patient location during evaluation: Endoscopy Anesthesia Type: MAC Level of consciousness: awake and alert Pain management: pain level controlled Vital Signs Assessment: post-procedure vital signs reviewed and stable Respiratory status: spontaneous breathing, nonlabored ventilation, respiratory function stable and patient connected to nasal cannula oxygen Cardiovascular status: blood pressure returned to baseline and stable Postop Assessment: no apparent nausea or vomiting Anesthetic complications: no   No complications documented.  Last Vitals:  Vitals:   04/25/20 2000 04/26/20 0600  BP: (!) 148/80 (!) 160/80  Pulse: 89 70  Resp: 18 20  Temp: 37 C 37.1 C  SpO2: 100% 100%    Last Pain:  Vitals:   04/26/20 0600  TempSrc: Tympanic  PainSc: 6    Pain Goal: Patients Stated Pain Goal: 1 (04/26/20 0600)                 Jazmyn Offner S

## 2020-04-26 NOTE — Progress Notes (Signed)
[facility]ology Progress Note  Crystal Ramsey 84 y.o. 10/19/1935  CC:  Ulcerative colitis  Subjective: Patient states abdominal pain is well-controlled with current pain regimen. Reports continued liquid stools, one stool every 1-2 hours.  Had a small amount of rectal bleeding last night but none today.  Due to persistent diarrhea, she is having rectal irritation.  Reports severe acid reflux, which she states is aggravated by liquid diet/juices.  Denies nausea/vomiting. Is interested in advancing her diet.  ROS : Review of Systems  Cardiovascular: Negative for chest pain and palpitations.  Gastrointestinal: Positive for abdominal pain, blood in stool, diarrhea and heartburn. Negative for constipation, melena, nausea and vomiting.   Objective: Vital signs in last 24 hours: Vitals:   04/25/20 2000 04/26/20 0600  BP: (!) 148/80 (!) 160/80  Pulse: 89 70  Resp: 18 20  Temp: 98.6 F (37 C) 98.8 F (37.1 C)  SpO2: 100% 100%    Physical Exam:  General:  Lethargic, oriented, cooperative, no distress  Head:  Normocephalic, without obvious abnormality, atraumatic  Eyes:  Anicteric sclera, EOMs intact  Lungs:   Clear to auscultation bilaterally, respirations unlabored  Heart:  Regular rate and rhythm, S1, S2 normal  Abdomen:   Soft with moderate RLQ and LLQ tenderness to palpation, no peritoneal signs.  Normoactive bowel sounds.   Extremities: Extremities normal, atraumatic, no  edema  Pulses: 2+ and symmetric    Lab Results: Recent Labs    04/25/20 0143 04/26/20 0222  NA 130* 131*  K 3.4* 3.8  CL 97* 100  CO2 22 23  GLUCOSE 114* 100*  BUN 13 9  CREATININE 0.84 0.60  CALCIUM 8.0* 7.9*   No results for input(s): AST, ALT, ALKPHOS, BILITOT, PROT, ALBUMIN in the last 72 hours. Recent Labs    04/25/20 0143 04/26/20 0222  WBC 8.0 8.0  HGB 9.5* 9.2*  HCT 27.1* 26.3*  MCV 90.3 91.0  PLT 421* 418*   No results for input(s): LABPROT, INR in the last 72  hours.    Assessment: Severe recto-sigmoid colitis: suspect ulcerative colitis.  Biopsy from 04/25/20 flex sig revealed chronic, severely active colitis.  No dysplasia or malignancy.  Features consistent with IBD.  Ischemic changes not seen.  Plan: Start Methylprednisolone 60 mg IV BID.  Protonix 40 mg IV BID for GERD, can transition to PO dosing at discharge.  Rectal cream QID for rectal irritation.  Advance to soft diet.  [facility] will follow.  Salley Slaughter PA-C 04/26/2020, 10:56 AM  Contact #  [phone]

## 2020-04-26 NOTE — Progress Notes (Addendum)
PROGRESS NOTE   Crystal Ramsey  CHY:[id number]    DOB: [birth year]    DOA: 04/21/2020  PCP: Lavone Orn, MD   I have briefly reviewed patients previous medical records in [facility].  Chief Complaint  Patient presents with  . Abdominal Pain    Brief Narrative:  84 year old female with history of DJD/chronic back pain, chronic constipation, GERD, developed diarrhea with blood specks about 10 to 12 days prior to admission while she was vacationing in [place].  She was seen in an ER there and decided to come back to [place].  Diarrhea briefly improved for couple of days and then worsened again 3 days prior to admission along with specks of bright red blood.  She was seen at [place] office and advised to come to the ED.  In the ED, hemoglobin around 10, serum sodium 124, mild hypokalemia, CT abdomen and pelvis noted thickening of the sigmoid colon concerning for sigmoid colitis.  GI consulted, s/p flexible sigmoidoscopy and biopsy, consistent with ulcerative colitis, treating with parenteral and topical/enteral steroids.   Assessment & Plan:  Principal Problem:   GI bleed Active Problems:   Hypertension   OA (osteoarthritis) of hip   Hyponatremia   Hypokalemia   Colitis, acute   Bloody stool   Newly diagnosed severe left-sided/rectosigmoid ulcerative colitis:  Patient presented with painless hematochezia and diarrhea.  CT concerning for sigmoid colitis  C. difficile PCR and GI panel PCR negative.  Empirically started on IV Cipro and metronidazole.  GI consulted, s/p flexible sigmoidoscopy with findings as noted below.  Initial concern for ischemic versus inflammatory colitis.  As per Dr. Michail Sermon, [facility] follow-up on 12/15: No ischemic changes on biopsies.  IV steroids started and will need to be on this for at least 2 days before changing to p.o. prednisone, Cipro discontinued, continue Flagyl, plan to start oral mesalamine and possibly [place] enemas as  outpatient  Tolerating soft diet but not eating much.  Diarrhea may be slightly better but too soon to tell-steroids only started about 24 hours ago.  GI follow-up appreciated-started mesalamine enema, oral mesalamine and budesonide.  GERD  Continue Protonix 40 mg IV twice daily, can transition to oral at discharge.  Dehydration with hyponatremia  Hyponatremia likely due to GI losses, poor oral intake and HCTZ.  HCTZ on hold.  Gently hydrating with IV normal saline while having ongoing diarrhea and poor oral intake.  Sodium is improved to 131.  Follow daily BMP.  Essential hypertension  Mildly uncontrolled.  Home dose of amiloride and HCTZ held and likely will DC indefinitely at discharge due to significant hyponatremia and risk for recurrence of same in the elderly patient.  Hydralazine oral as needed for now.  Could consider amlodipine.  Hypokalemia  Replaced.  Chronic pain/osteoarthritis  Supportive/symptomatic treatment with home regimen of hydrocodone.  Anemia of chronic disease  Hemoglobin stable in the 9 g range.  Follow CBC periodically and transfuse if hemoglobin 7 g or less.  Body mass index is 30.42 kg/m.    DVT prophylaxis: SCDs Start: 04/21/20 2220     Code Status: Full Code Family Communication: None at bedside. Disposition:  Status is: Inpatient  Remains inpatient appropriate because:Inpatient level of care appropriate due to severity of illness   Dispo: The patient is from: Home              Anticipated d/c is to: Home              Anticipated  d/c date is: 3 days              Patient currently is not medically stable to d/c.        Consultants:   [facility].  Procedures:   Flexible sigmoidoscopy 04/25/2020:  Impression:  - Preparation of the colon was fair. - Congested, erythematous, eroded, friable (with spontaneous bleeding), granular, hemorrhagic, inflamed, nodular, ulcerated, vascular-pattern-decreased and thickened folds of  the mucosa in the rectum, in the recto-sigmoid colon and in the sigmoid colon. Biopsied. - Endoscopic findings concerning for ischemic colitis but ulcerative colitis also in differential.  Pathology report:  FINAL MICROSCOPIC DIAGNOSIS:   A. COLON, SIGMOID AND RECTUM, BIOPSY:  - Chronic severally active colitis.  - No dysplasia or malignancy.   COMMENT:   The features are consistent with inflammatory bowel disease in the  proper clinical setting. Ischemic changes are not seen.   Antimicrobials:    Anti-infectives (From admission, onward)   Start     Dose/Rate Route Frequency Ordered Stop   04/25/20 2200  ciprofloxacin (CIPRO) IVPB 200 mg  Status:  Discontinued        200 mg 100 mL/hr over 60 Minutes Intravenous Every 12 hours 04/25/20 1426 04/26/20 1439   04/25/20 1500  metroNIDAZOLE (FLAGYL) IVPB 250 mg 50 mL        250 mg 50 mL/hr over 60 Minutes Intravenous Every 8 hours 04/25/20 1426     04/24/20 1400  metroNIDAZOLE (FLAGYL) tablet 500 mg  Status:  Discontinued        500 mg Oral Every 8 hours 04/24/20 1219 04/25/20 1426   04/24/20 1315  ciprofloxacin (CIPRO) tablet 500 mg  Status:  Discontinued        500 mg Oral 2 times daily 04/24/20 1219 04/25/20 1426   04/23/20 0000  ciprofloxacin (CIPRO) 250 MG tablet        250 mg Oral 2 times daily 04/23/20 1008 04/28/20 2359   04/23/20 0000  metroNIDAZOLE (FLAGYL) 250 MG tablet        250 mg Oral 3 times daily 04/23/20 1008 04/28/20 2359   04/21/20 2330  metroNIDAZOLE (FLAGYL) IVPB 500 mg  Status:  Discontinued        500 mg 100 mL/hr over 60 Minutes Intravenous Every 8 hours 04/21/20 2301 04/24/20 1219   04/21/20 2315  ciprofloxacin (CIPRO) IVPB 400 mg  Status:  Discontinued        400 mg 200 mL/hr over 60 Minutes Intravenous Every 12 hours 04/21/20 2306 04/24/20 1219        Subjective:  Stated that she is enjoying the soft food rather than the liquids.  However not eating much.  No nausea, vomiting or abdominal pain.   Diarrhea may be a tiny bit better, volume less, some stool specks.  No blood.  Objective:   Vitals:   04/26/20 1508 04/26/20 2121 04/27/20 0431 04/27/20 1341  BP: (!) 161/76 (!) 168/88 (!) 151/92 (!) 155/79  Pulse: (!) 108 98 (!) 109 (!) 101  Resp: 18 15 15 16   Temp: 98.7 F (37.1 C) 98.2 F (36.8 C) 97.6 F (36.4 C) 98.4 F (36.9 C)  TempSrc: Oral Oral Oral Oral  SpO2: 93% 94% 93% 95%  Weight:      Height:        General exam: Elderly female, moderately built and overweight, appeared in no obvious discomfort.  Oral mucosa moist. Respiratory system: Clear to auscultation.  No increased work of breathing. Cardiovascular system: S1 &  S2 heard, RRR. No JVD, murmurs, rubs, gallops or clicks. No pedal edema. Gastrointestinal system: Abdomen is minimally distended, tender in left lower quadrant on deep palpation but no guarding, rigidity or rebound.  No organomegaly or masses appreciated.  Normal bowel sounds heard. Central nervous system: Alert and oriented. No focal neurological deficits. Extremities: Symmetric 5 x 5 power. Skin: No rashes, lesions or ulcers Psychiatry: Judgement and insight appear normal. Mood & affect appropriate.     Data Reviewed:   I have personally reviewed following labs and imaging studies   CBC: Recent Labs  Lab 04/25/20 0143 04/26/20 0222 04/27/20 0235  WBC 8.0 8.0 9.9  HGB 9.5* 9.2* 10.6*  HCT 27.1* 26.3* 30.8*  MCV 90.3 91.0 90.1  PLT 421* 418* 457*    Basic Metabolic Panel: Recent Labs  Lab 04/25/20 0143 04/26/20 0222 04/27/20 0235  NA 130* 131* 130*  K 3.4* 3.8 3.5  CL 97* 100 97*  CO2 22 23 23   GLUCOSE 114* 100* 157*  BUN 13 9 11   CREATININE 0.84 0.60 0.87  CALCIUM 8.0* 7.9* 8.0*    Liver Function Tests: Recent Labs  Lab 04/21/20 1442 04/22/20 0353  AST 19 16  ALT 20 17  ALKPHOS 72 63  BILITOT 0.4 0.7  PROT 6.6 5.4*  ALBUMIN 2.8* 2.2*    CBG: No results for input(s): GLUCAP in the last 168 hours.  Microbiology  Studies:   Recent Results (from the past 240 hour(s))  Resp Panel by RT-PCR (Flu A&B, Covid) Nasopharyngeal Swab     Status: None   Collection Time: 04/21/20  5:19 PM   Specimen: Nasopharyngeal Swab; Nasopharyngeal(NP) swabs in vial transport medium  Result Value Ref Range Status   SARS Coronavirus 2 by RT PCR NEGATIVE NEGATIVE Final    Comment: (NOTE) SARS-CoV-2 target nucleic acids are NOT DETECTED.  The SARS-CoV-2 RNA is generally detectable in upper respiratory specimens during the acute phase of infection. The lowest concentration of SARS-CoV-2 viral copies this assay can detect is 138 copies/mL. A negative result does not preclude SARS-Cov-2 infection and should not be used as the sole basis for treatment or other patient management decisions. A negative result may occur with  improper specimen collection/handling, submission of specimen other than nasopharyngeal swab, presence of viral mutation(s) within the areas targeted by this assay, and inadequate number of viral copies(<138 copies/mL). A negative result must be combined with clinical observations, patient history, and epidemiological information. The expected result is Negative.  Fact Sheet for Patients:  EntrepreneurPulse.com.au  Fact Sheet for Healthcare Providers:  IncredibleEmployment.be  This test is no t yet approved or cleared by the [place] FDA and  has been authorized for detection and/or diagnosis of SARS-CoV-2 by FDA under an Emergency Use Authorization (EUA). This EUA will remain  in effect (meaning this test can be used) for the duration of the COVID-19 declaration under Section 564(b)(1) of the Act, 21 U.S.C.section 360bbb-3(b)(1), unless the authorization is terminated  or revoked sooner.       Influenza A by PCR NEGATIVE NEGATIVE Final   Influenza B by PCR NEGATIVE NEGATIVE Final    Comment: (NOTE) The Xpert Xpress SARS-CoV-2/FLU/RSV plus assay is  intended as an aid in the diagnosis of influenza from Nasopharyngeal swab specimens and should not be used as a sole basis for treatment. Nasal washings and aspirates are unacceptable for Xpert Xpress SARS-CoV-2/FLU/RSV testing.  Fact Sheet for Patients: EntrepreneurPulse.com.au  Fact Sheet for Healthcare Providers: IncredibleEmployment.be  This test is not  yet approved or cleared by the [place] and has been authorized for detection and/or diagnosis of SARS-CoV-2 by FDA under an Emergency Use Authorization (EUA). This EUA will remain in effect (meaning this test can be used) for the duration of the COVID-19 declaration under Section 564(b)(1) of the Act, 21 U.S.C. section 360bbb-3(b)(1), unless the authorization is terminated or revoked.  Performed at [facility], [place] [address], [place], [place] [address]   Gastrointestinal Panel by PCR , Stool     Status: None   Collection Time: 04/21/20  9:56 PM   Specimen: Stool  Result Value Ref Range Status   Campylobacter species NOT DETECTED NOT DETECTED Final   Plesimonas shigelloides NOT DETECTED NOT DETECTED Final   Salmonella species NOT DETECTED NOT DETECTED Final   Yersinia enterocolitica NOT DETECTED NOT DETECTED Final   Vibrio species NOT DETECTED NOT DETECTED Final   Vibrio cholerae NOT DETECTED NOT DETECTED Final   Enteroaggregative E coli (EAEC) NOT DETECTED NOT DETECTED Final   Enteropathogenic E coli (EPEC) NOT DETECTED NOT DETECTED Final   Enterotoxigenic E coli (ETEC) NOT DETECTED NOT DETECTED Final   Shiga like toxin producing E coli (STEC) NOT DETECTED NOT DETECTED Final   Shigella/Enteroinvasive E coli (EIEC) NOT DETECTED NOT DETECTED Final   Cryptosporidium NOT DETECTED NOT DETECTED Final   Cyclospora cayetanensis NOT DETECTED NOT DETECTED Final   Entamoeba histolytica NOT DETECTED NOT DETECTED Final   Giardia lamblia NOT DETECTED NOT DETECTED Final   Adenovirus  F40/41 NOT DETECTED NOT DETECTED Final   Astrovirus NOT DETECTED NOT DETECTED Final   Norovirus GI/GII NOT DETECTED NOT DETECTED Final   Rotavirus A NOT DETECTED NOT DETECTED Final   Sapovirus (I, II, IV, and V) NOT DETECTED NOT DETECTED Final    Comment: Performed at [facility], [address] [place], [place] [address]  C Difficile Quick Screen (NO PCR Reflex)     Status: None   Collection Time: 04/21/20  9:56 PM   Specimen: Stool  Result Value Ref Range Status   C Diff antigen NEGATIVE NEGATIVE Final   C Diff toxin NEGATIVE NEGATIVE Final   C Diff interpretation No C. difficile detected.  Final    Comment: Performed at [facility], [place] [address], [place],  [address]     Radiology Studies:  No results found.   Scheduled Meds:   . [START ON 04/28/2020] budesonide  9 mg Oral Daily  . Gerhardt's butt cream   Topical QID  . [START ON 04/28/2020] mesalamine  4.8 g Oral Q breakfast  . mesalamine  4 g Rectal QHS  . methylPREDNISolone (SOLU-MEDROL) injection  60 mg Intravenous Q12H  . pantoprazole (PROTONIX) IV  40 mg Intravenous Q12H    Continuous Infusions:   . sodium chloride 50 mL/hr at 04/26/20 0336  . metronidazole 250 mg (04/27/20 1450)     LOS: 6 days     Vernell Leep, MD, [place], [facility]. [facility]    To contact the attending provider between 7A-7P or the covering provider during after hours 7P-7A, please log into the web site [email or web address] and access using universal [place] password for that web site. If you do not have the password, please call the hospital operator.  04/27/2020, 5:45 PM

## 2020-04-27 LAB — BASIC METABOLIC PANEL
Anion gap: 10 (ref 5–15)
BUN: 11 mg/dL (ref 8–23)
CO2: 23 mmol/L (ref 22–32)
Calcium: 8 mg/dL — ABNORMAL LOW (ref 8.9–10.3)
Chloride: 97 mmol/L — ABNORMAL LOW (ref 98–111)
Creatinine, Ser: 0.87 mg/dL (ref 0.44–1.00)
GFR, Estimated: >60 mL/min (ref >60)
Glucose, Bld: 157 mg/dL — ABNORMAL HIGH (ref 70–99)
Potassium: 3.5 mmol/L (ref 3.5–5.1)
Sodium: 130 mmol/L — ABNORMAL LOW (ref 135–145)

## 2020-04-27 LAB — CBC
HCT: 30.8 % — ABNORMAL LOW (ref 36.0–46.0)
Hemoglobin: 10.6 g/dL — ABNORMAL LOW (ref 12.0–15.0)
MCH: 31 pg (ref 26.0–34.0)
MCHC: 34.4 g/dL (ref 30.0–36.0)
MCV: 90.1 fL (ref 80.0–100.0)
Platelets: 457 10*3/uL — ABNORMAL HIGH (ref 150–400)
RBC: 3.42 MIL/uL — ABNORMAL LOW (ref 3.87–5.11)
RDW: 13.2 % (ref 11.5–15.5)
WBC: 9.9 10*3/uL (ref 4.0–10.5)
nRBC: 0 % (ref 0.0–0.2)

## 2020-04-27 MED ORDER — BUDESONIDE 3 MG PO CPEP
9.0000 mg | ORAL_CAPSULE | Freq: Every day | ORAL | Status: DC
  Administered 2020-04-28 – 2020-05-01 (×4): 9 mg via ORAL
  Filled 2020-04-27 (×4): qty 3

## 2020-04-27 MED ORDER — MESALAMINE 4 G RE ENEM
4.0000 g | ENEMA | Freq: Every day | RECTAL | Status: DC
  Administered 2020-04-27 – 2020-04-30 (×4): 4 g via RECTAL
  Filled 2020-04-27 (×6): qty 60

## 2020-04-27 MED ORDER — MESALAMINE 1.2 G PO TBEC
4.8000 g | DELAYED_RELEASE_TABLET | Freq: Every day | ORAL | Status: DC
  Administered 2020-04-28 – 2020-05-01 (×4): 4.8 g via ORAL
  Filled 2020-04-27 (×4): qty 4

## 2020-04-27 NOTE — Care Management Important Message (Signed)
Important Message  Patient Details  Name: Crystal Ramsey MRN: [id number] Date of Birth: 12/31/1935   Medicare Important Message Given:  Yes     Moira Umholtz 04/27/2020, 3:15 PM

## 2020-04-27 NOTE — Progress Notes (Signed)
Still having frequent (q 1.5 hr) diarrheal squirts, including nocturnally.  However, she is no longer seeing blood, and this morning one of her stools actually had some form to it.  Overall, however, she does not think her diarrhea has improved since being admitted.  Pt is in NAD, very articulate, abd nontender.  Hgb improved.   IMPR:  Perhaps very early trend toward improvement, but still quite symptomatic  PLAN:  1. Continue IV steroids for now; reassess daily 2. Add topical therapy--mesalamine enema qHS (ordered); d/w pt and she's agreeable 3. Add oral mesalamine 4. Add budesonide  Cleotis Nipper, M.D. Pager [phone] If no answer or after 5 PM call [phone]

## 2020-04-27 NOTE — Progress Notes (Signed)
PROGRESS NOTE   Crystal Ramsey  RUE:[id number]    DOB: 1935-11-06    DOA: 04/21/2020  PCP: Lavone Orn, MD   I have briefly reviewed patients previous medical records in [facility].  Chief Complaint  Patient presents with  . Abdominal Pain    Brief Narrative:  84 year old female with history of DJD/chronic back pain, chronic constipation, GERD, developed diarrhea with blood specks about 10 to 12 days prior to admission while she was vacationing in [place].  She was seen in an ER there and decided to come back to [place].  Diarrhea briefly improved for couple of days and then worsened again 3 days prior to admission along with specks of bright red blood.  She was seen at [place] office and advised to come to the ED.  In the ED, hemoglobin around 10, serum sodium 124, mild hypokalemia, CT abdomen and pelvis noted thickening of the sigmoid colon concerning for sigmoid colitis.  GI consulted, s/p flexible sigmoidoscopy and biopsy, consistent with ulcerative colitis, treating with parenteral and topical/enteral steroids.   Assessment & Plan:  Principal Problem:   GI bleed Active Problems:   Hypertension   OA (osteoarthritis) of hip   Hyponatremia   Hypokalemia   Colitis, acute   Bloody stool   Newly diagnosed severe left-sided/rectosigmoid ulcerative colitis:  Patient presented with painless hematochezia and diarrhea.  CT concerning for sigmoid colitis  C. difficile PCR and GI panel PCR negative.  Empirically started on IV Cipro and metronidazole.  GI consulted, s/p flexible sigmoidoscopy with findings as noted below.  Initial concern for ischemic versus inflammatory colitis.  As per Dr. Michail Sermon, [facility] follow-up on 12/15: No ischemic changes on biopsies.  IV steroids started and will need to be on this for at least 2 days before changing to p.o. prednisone, Cipro discontinued, continue Flagyl, plan to start oral mesalamine and possibly [place] enemas as  outpatient  Tolerating soft diet but not eating much.  Diarrhea may be slightly better but too soon to tell-steroids only started about 24 hours ago.  GI follow-up appreciated-started mesalamine enema, oral mesalamine and budesonide.  GERD  Continue Protonix 40 mg IV twice daily, can transition to oral at discharge.  Dehydration with hyponatremia  Hyponatremia likely due to GI losses, poor oral intake and HCTZ.  HCTZ on hold.  Gently hydrating with IV normal saline while having ongoing diarrhea and poor oral intake.  Sodium is improved to 131.  Follow daily BMP.  Essential hypertension  Mildly uncontrolled.  Home dose of amiloride and HCTZ held and likely will DC indefinitely at discharge due to significant hyponatremia and risk for recurrence of same in the elderly patient.  Hydralazine oral as needed for now.  Could consider amlodipine.  Hypokalemia  Replaced.  Chronic pain/osteoarthritis  Supportive/symptomatic treatment with home regimen of hydrocodone.  Anemia of chronic disease  Hemoglobin stable in the 9 g range.  Follow CBC periodically and transfuse if hemoglobin 7 g or less.  Body mass index is 30.42 kg/m.    DVT prophylaxis: SCDs Start: 04/21/20 2220     Code Status: Full Code Family Communication: None at bedside. Disposition:  Status is: Inpatient  Remains inpatient appropriate because:Inpatient level of care appropriate due to severity of illness   Dispo: The patient is from: Home              Anticipated d/c is to: Home              Anticipated  d/c date is: 3 days              Patient currently is not medically stable to d/c.        Consultants:   [facility].  Procedures:   Flexible sigmoidoscopy 04/25/2020:  Impression:  - Preparation of the colon was fair. - Congested, erythematous, eroded, friable (with spontaneous bleeding), granular, hemorrhagic, inflamed, nodular, ulcerated, vascular-pattern-decreased and thickened folds of  the mucosa in the rectum, in the recto-sigmoid colon and in the sigmoid colon. Biopsied. - Endoscopic findings concerning for ischemic colitis but ulcerative colitis also in differential.  Pathology report:  FINAL MICROSCOPIC DIAGNOSIS:   A. COLON, SIGMOID AND RECTUM, BIOPSY:  - Chronic severally active colitis.  - No dysplasia or malignancy.   COMMENT:   The features are consistent with inflammatory bowel disease in the  proper clinical setting. Ischemic changes are not seen.   Antimicrobials:    Anti-infectives (From admission, onward)   Start     Dose/Rate Route Frequency Ordered Stop   04/25/20 2200  ciprofloxacin (CIPRO) IVPB 200 mg  Status:  Discontinued        200 mg 100 mL/hr over 60 Minutes Intravenous Every 12 hours 04/25/20 1426 04/26/20 1439   04/25/20 1500  metroNIDAZOLE (FLAGYL) IVPB 250 mg 50 mL        250 mg 50 mL/hr over 60 Minutes Intravenous Every 8 hours 04/25/20 1426     04/24/20 1400  metroNIDAZOLE (FLAGYL) tablet 500 mg  Status:  Discontinued        500 mg Oral Every 8 hours 04/24/20 1219 04/25/20 1426   04/24/20 1315  ciprofloxacin (CIPRO) tablet 500 mg  Status:  Discontinued        500 mg Oral 2 times daily 04/24/20 1219 04/25/20 1426   04/23/20 0000  ciprofloxacin (CIPRO) 250 MG tablet        250 mg Oral 2 times daily 04/23/20 1008 04/28/20 2359   04/23/20 0000  metroNIDAZOLE (FLAGYL) 250 MG tablet        250 mg Oral 3 times daily 04/23/20 1008 04/28/20 2359   04/21/20 2330  metroNIDAZOLE (FLAGYL) IVPB 500 mg  Status:  Discontinued        500 mg 100 mL/hr over 60 Minutes Intravenous Every 8 hours 04/21/20 2301 04/24/20 1219   04/21/20 2315  ciprofloxacin (CIPRO) IVPB 400 mg  Status:  Discontinued        400 mg 200 mL/hr over 60 Minutes Intravenous Every 12 hours 04/21/20 2306 04/24/20 1219        Subjective:  Stated that she is enjoying the soft food rather than the liquids.  However not eating much.  No nausea, vomiting or abdominal pain.   Diarrhea may be a tiny bit better, volume less, some stool specks.  No blood.  Objective:   Vitals:   04/26/20 1508 04/26/20 2121 04/27/20 0431 04/27/20 1341  BP: (!) 161/76 (!) 168/88 (!) 151/92 (!) 155/79  Pulse: (!) 108 98 (!) 109 (!) 101  Resp: 18 15 15 16   Temp: 98.7 F (37.1 C) 98.2 F (36.8 C) 97.6 F (36.4 C) 98.4 F (36.9 C)  TempSrc: Oral Oral Oral Oral  SpO2: 93% 94% 93% 95%  Weight:      Height:        General exam: Elderly female, moderately built and overweight, appeared in no obvious discomfort.  Oral mucosa moist. Respiratory system: Clear to auscultation.  No increased work of breathing. Cardiovascular system: S1 &  S2 heard, RRR. No JVD, murmurs, rubs, gallops or clicks. No pedal edema. Gastrointestinal system: Abdomen is minimally distended, tender in left lower quadrant on deep palpation but no guarding, rigidity or rebound.  No organomegaly or masses appreciated.  Normal bowel sounds heard. Central nervous system: Alert and oriented. No focal neurological deficits. Extremities: Symmetric 5 x 5 power. Skin: No rashes, lesions or ulcers Psychiatry: Judgement and insight appear normal. Mood & affect appropriate.     Data Reviewed:   I have personally reviewed following labs and imaging studies   CBC: Recent Labs  Lab 04/25/20 0143 04/26/20 0222 04/27/20 0235  WBC 8.0 8.0 9.9  HGB 9.5* 9.2* 10.6*  HCT 27.1* 26.3* 30.8*  MCV 90.3 91.0 90.1  PLT 421* 418* 457*    Basic Metabolic Panel: Recent Labs  Lab 04/25/20 0143 04/26/20 0222 04/27/20 0235  NA 130* 131* 130*  K 3.4* 3.8 3.5  CL 97* 100 97*  CO2 22 23 23   GLUCOSE 114* 100* 157*  BUN 13 9 11   CREATININE 0.84 0.60 0.87  CALCIUM 8.0* 7.9* 8.0*    Liver Function Tests: Recent Labs  Lab 04/21/20 1442 04/22/20 0353  AST 19 16  ALT 20 17  ALKPHOS 72 63  BILITOT 0.4 0.7  PROT 6.6 5.4*  ALBUMIN 2.8* 2.2*    CBG: No results for input(s): GLUCAP in the last 168 hours.  Microbiology  Studies:   Recent Results (from the past 240 hour(s))  Resp Panel by RT-PCR (Flu A&B, Covid) Nasopharyngeal Swab     Status: None   Collection Time: 04/21/20  5:19 PM   Specimen: Nasopharyngeal Swab; Nasopharyngeal(NP) swabs in vial transport medium  Result Value Ref Range Status   SARS Coronavirus 2 by RT PCR NEGATIVE NEGATIVE Final    Comment: (NOTE) SARS-CoV-2 target nucleic acids are NOT DETECTED.  The SARS-CoV-2 RNA is generally detectable in upper respiratory specimens during the acute phase of infection. The lowest concentration of SARS-CoV-2 viral copies this assay can detect is 138 copies/mL. A negative result does not preclude SARS-Cov-2 infection and should not be used as the sole basis for treatment or other patient management decisions. A negative result may occur with  improper specimen collection/handling, submission of specimen other than nasopharyngeal swab, presence of viral mutation(s) within the areas targeted by this assay, and inadequate number of viral copies(<138 copies/mL). A negative result must be combined with clinical observations, patient history, and epidemiological information. The expected result is Negative.  Fact Sheet for Patients:  EntrepreneurPulse.com.au  Fact Sheet for Healthcare Providers:  IncredibleEmployment.be  This test is no t yet approved or cleared by the [place] FDA and  has been authorized for detection and/or diagnosis of SARS-CoV-2 by FDA under an Emergency Use Authorization (EUA). This EUA will remain  in effect (meaning this test can be used) for the duration of the COVID-19 declaration under Section 564(b)(1) of the Act, 21 U.S.C.section 360bbb-3(b)(1), unless the authorization is terminated  or revoked sooner.       Influenza A by PCR NEGATIVE NEGATIVE Final   Influenza B by PCR NEGATIVE NEGATIVE Final    Comment: (NOTE) The Xpert Xpress SARS-CoV-2/FLU/RSV plus assay is  intended as an aid in the diagnosis of influenza from Nasopharyngeal swab specimens and should not be used as a sole basis for treatment. Nasal washings and aspirates are unacceptable for Xpert Xpress SARS-CoV-2/FLU/RSV testing.  Fact Sheet for Patients: EntrepreneurPulse.com.au  Fact Sheet for Healthcare Providers: IncredibleEmployment.be  This test is not  yet approved or cleared by the [place] and has been authorized for detection and/or diagnosis of SARS-CoV-2 by FDA under an Emergency Use Authorization (EUA). This EUA will remain in effect (meaning this test can be used) for the duration of the COVID-19 declaration under Section 564(b)(1) of the Act, 21 U.S.C. section 360bbb-3(b)(1), unless the authorization is terminated or revoked.  Performed at [facility], [place] [address], [place], [place] [address]   Gastrointestinal Panel by PCR , Stool     Status: None   Collection Time: 04/21/20  9:56 PM   Specimen: Stool  Result Value Ref Range Status   Campylobacter species NOT DETECTED NOT DETECTED Final   Plesimonas shigelloides NOT DETECTED NOT DETECTED Final   Salmonella species NOT DETECTED NOT DETECTED Final   Yersinia enterocolitica NOT DETECTED NOT DETECTED Final   Vibrio species NOT DETECTED NOT DETECTED Final   Vibrio cholerae NOT DETECTED NOT DETECTED Final   Enteroaggregative E coli (EAEC) NOT DETECTED NOT DETECTED Final   Enteropathogenic E coli (EPEC) NOT DETECTED NOT DETECTED Final   Enterotoxigenic E coli (ETEC) NOT DETECTED NOT DETECTED Final   Shiga like toxin producing E coli (STEC) NOT DETECTED NOT DETECTED Final   Shigella/Enteroinvasive E coli (EIEC) NOT DETECTED NOT DETECTED Final   Cryptosporidium NOT DETECTED NOT DETECTED Final   Cyclospora cayetanensis NOT DETECTED NOT DETECTED Final   Entamoeba histolytica NOT DETECTED NOT DETECTED Final   Giardia lamblia NOT DETECTED NOT DETECTED Final   Adenovirus  F40/41 NOT DETECTED NOT DETECTED Final   Astrovirus NOT DETECTED NOT DETECTED Final   Norovirus GI/GII NOT DETECTED NOT DETECTED Final   Rotavirus A NOT DETECTED NOT DETECTED Final   Sapovirus (I, II, IV, and V) NOT DETECTED NOT DETECTED Final    Comment: Performed at [facility], [address] [place], [place] [address]  C Difficile Quick Screen (NO PCR Reflex)     Status: None   Collection Time: 04/21/20  9:56 PM   Specimen: Stool  Result Value Ref Range Status   C Diff antigen NEGATIVE NEGATIVE Final   C Diff toxin NEGATIVE NEGATIVE Final   C Diff interpretation No C. difficile detected.  Final    Comment: Performed at [facility], [place] [address] [place], [place] [address]     Radiology Studies:  No results found.   Scheduled Meds:   . [START ON 04/28/2020] budesonide  9 mg Oral Daily  . Gerhardt's butt cream   Topical QID  . [START ON 04/28/2020] mesalamine  4.8 g Oral Q breakfast  . mesalamine  4 g Rectal QHS  . methylPREDNISolone (SOLU-MEDROL) injection  60 mg Intravenous Q12H  . pantoprazole (PROTONIX) IV  40 mg Intravenous Q12H    Continuous Infusions:   . sodium chloride 50 mL/hr at 04/26/20 0336  . metronidazole 250 mg (04/27/20 1450)     LOS: 6 days     Vernell Leep, MD, [place], [facility]. [facility]    To contact the attending provider between 7A-7P or the covering provider during after hours 7P-7A, please log into the web site [email or web address] and access using universal [place] password for that web site. If you do not have the password, please call the hospital operator.  04/27/2020, 6:55 PM

## 2020-04-28 LAB — BASIC METABOLIC PANEL
Anion gap: 7 (ref 5–15)
BUN: 17 mg/dL (ref 8–23)
CO2: 23 mmol/L (ref 22–32)
Calcium: 7.9 mg/dL — ABNORMAL LOW (ref 8.9–10.3)
Chloride: 102 mmol/L (ref 98–111)
Creatinine, Ser: 0.83 mg/dL (ref 0.44–1.00)
GFR, Estimated: >60 mL/min (ref >60)
Glucose, Bld: 153 mg/dL — ABNORMAL HIGH (ref 70–99)
Potassium: 3.7 mmol/L (ref 3.5–5.1)
Sodium: 132 mmol/L — ABNORMAL LOW (ref 135–145)

## 2020-04-28 LAB — CBC
HCT: 29.3 % — ABNORMAL LOW (ref 36.0–46.0)
Hemoglobin: 9.9 g/dL — ABNORMAL LOW (ref 12.0–15.0)
MCH: 30.5 pg (ref 26.0–34.0)
MCHC: 33.8 g/dL (ref 30.0–36.0)
MCV: 90.2 fL (ref 80.0–100.0)
Platelets: 502 10*3/uL — ABNORMAL HIGH (ref 150–400)
RBC: 3.25 MIL/uL — ABNORMAL LOW (ref 3.87–5.11)
RDW: 13.2 % (ref 11.5–15.5)
WBC: 9 10*3/uL (ref 4.0–10.5)
nRBC: 0 % (ref 0.0–0.2)

## 2020-04-28 MED ORDER — PREDNISONE 20 MG PO TABS
40.0000 mg | ORAL_TABLET | Freq: Every day | ORAL | Status: DC
  Administered 2020-04-29 – 2020-04-30 (×2): 40 mg via ORAL
  Filled 2020-04-28 (×2): qty 2

## 2020-04-28 NOTE — Plan of Care (Signed)
  Problem: Education: Goal: Knowledge of General Education information will improve Description Including pain rating scale, medication(s)/side effects and non-pharmacologic comfort measures Outcome: Progressing   Problem: Clinical Measurements: Goal: Ability to maintain clinical measurements within normal limits will improve Outcome: Progressing   Problem: Activity: Goal: Risk for activity intolerance will decrease Outcome: Progressing   

## 2020-04-28 NOTE — Progress Notes (Signed)
Patient actually had a good night but then the diarrhea started again today, still without any further blood, and again with the initial stool being somewhat formed.  The patient does not have any abdominal pain.  Mesalamine enemas were added last night, and oral mesalamine and oral budesonide were started this morning.  The patient remains on IV Solu-Medrol.  Hemoglobin remains essentially stable.  On exam, she is in no distress whatsoever.  The abdomen is without overt distention but there is moderate diffuse tympany in the abdomen is slightly firm.  Impression: Distal ulcerative colitis with possible early response to intensified therapy.  Plan:  1.  Transition to oral steroids (last dose of IV steroids at noon today; prednisone 40 mg orally starting tomorrow)  2.  Continue other meds for distal colitis (budesonide, oral mesalamine, mesalamine enemas)  3.  Stop metronidazole  4.  KUB to check for excessive colonic dilatation in view of tympany on today's exam  5.  Continue to monitor.  I think this patient is still several days away from discharge  Cleotis Nipper, M.D. Pager [phone] If no answer or after 5 PM call [phone]

## 2020-04-28 NOTE — Progress Notes (Signed)
PROGRESS NOTE   Crystal Ramsey  YTK:[id number]    DOB: 1935/06/08    DOA: 04/21/2020  PCP: Lavone Orn, MD   I have briefly reviewed patients previous medical records in [facility].  Chief Complaint  Patient presents with  . Abdominal Pain    Brief Narrative:  84 year old female with history of DJD/chronic back pain, chronic constipation, GERD, developed diarrhea with blood specks about 10 to 12 days prior to admission while she was vacationing in [place].  She was seen in an ER there and decided to come back to [place].  Diarrhea briefly improved for couple of days and then worsened again 3 days prior to admission along with specks of bright red blood.  She was seen at [place] office and advised to come to the ED.  In the ED, hemoglobin around 10, serum sodium 124, mild hypokalemia, CT abdomen and pelvis noted thickening of the sigmoid colon concerning for sigmoid colitis.  GI consulted, s/p flexible sigmoidoscopy and biopsy, consistent with ulcerative colitis, treating with parenteral and topical/enteral steroids.  Slowly improving.   Assessment & Plan:  Principal Problem:   GI bleed Active Problems:   Hypertension   OA (osteoarthritis) of hip   Hyponatremia   Hypokalemia   Colitis, acute   Bloody stool   Newly diagnosed severe left-sided/rectosigmoid ulcerative colitis:  Patient presented with painless hematochezia and diarrhea.  CT concerning for sigmoid colitis  C. difficile PCR and GI panel PCR negative.  Empirically started on IV Cipro and metronidazole.  GI consulted, s/p flexible sigmoidoscopy with findings as noted below.  Initial concern for ischemic versus inflammatory colitis.  GI continues to follow.  Treating for distal ulcerative colitis.  As per GI follow-up today, IV steroids being changed to oral prednisone 40 mg daily, continuing budesonide, oral mesalamine and mesalamine enemas, metronidazole stopped.  Slowly  improving.  GERD  Continue Protonix 40 mg IV twice daily, can transition to oral at discharge.  Dehydration with hyponatremia  Hyponatremia likely due to GI losses, poor oral intake and HCTZ.  HCTZ on hold.  Gently hydrating with IV normal saline while having ongoing diarrhea and poor oral intake.  Sodium stable in the low 130s.  Will DC IV fluids when diarrhea improves.  Essential hypertension  Mildly uncontrolled.  Home dose of amiloride and HCTZ held and likely will DC indefinitely at discharge due to significant hyponatremia and risk for recurrence of same in the elderly patient.  Hydralazine oral as needed for now.  Could consider amlodipine.  Hypokalemia  Replaced.  Chronic pain/osteoarthritis  Supportive/symptomatic treatment with home regimen of hydrocodone.  Anemia of chronic disease  Hemoglobin stable in the 9 g range.  Follow CBC periodically and transfuse if hemoglobin 7 g or less.  Body mass index is 30.42 kg/m.    DVT prophylaxis: SCDs Start: 04/21/20 2220     Code Status: Full Code Family Communication: None at bedside.  Offered to update patient's family or friends but patient declines and states that she has been keeping them abreast. Disposition:  Status is: Inpatient  Remains inpatient appropriate because:Inpatient level of care appropriate due to severity of illness   Dispo: The patient is from: Home              Anticipated d/c is to: Home              Anticipated d/c date is: 3 days as per GI follow-up, discharge is still several days away.  Patient currently is not medically stable to d/c.        Consultants:   [facility].  Procedures:   Flexible sigmoidoscopy 04/25/2020:  Impression:  - Preparation of the colon was fair. - Congested, erythematous, eroded, friable (with spontaneous bleeding), granular, hemorrhagic, inflamed, nodular, ulcerated, vascular-pattern-decreased and thickened folds of the mucosa in the  rectum, in the recto-sigmoid colon and in the sigmoid colon. Biopsied. - Endoscopic findings concerning for ischemic colitis but ulcerative colitis also in differential.  Pathology report:  FINAL MICROSCOPIC DIAGNOSIS:   A. COLON, SIGMOID AND RECTUM, BIOPSY:  - Chronic severally active colitis.  - No dysplasia or malignancy.   COMMENT:   The features are consistent with inflammatory bowel disease in the  proper clinical setting. Ischemic changes are not seen.   Antimicrobials:    Anti-infectives (From admission, onward)   Start     Dose/Rate Route Frequency Ordered Stop   04/25/20 2200  ciprofloxacin (CIPRO) IVPB 200 mg  Status:  Discontinued        200 mg 100 mL/hr over 60 Minutes Intravenous Every 12 hours 04/25/20 1426 04/26/20 1439   04/25/20 1500  metroNIDAZOLE (FLAGYL) IVPB 250 mg 50 mL  Status:  Discontinued        250 mg 50 mL/hr over 60 Minutes Intravenous Every 8 hours 04/25/20 1426 04/28/20 1039   04/24/20 1400  metroNIDAZOLE (FLAGYL) tablet 500 mg  Status:  Discontinued        500 mg Oral Every 8 hours 04/24/20 1219 04/25/20 1426   04/24/20 1315  ciprofloxacin (CIPRO) tablet 500 mg  Status:  Discontinued        500 mg Oral 2 times daily 04/24/20 1219 04/25/20 1426   04/23/20 0000  ciprofloxacin (CIPRO) 250 MG tablet        250 mg Oral 2 times daily 04/23/20 1008 04/28/20 2359   04/23/20 0000  metroNIDAZOLE (FLAGYL) 250 MG tablet        250 mg Oral 3 times daily 04/23/20 1008 04/28/20 2359   04/21/20 2330  metroNIDAZOLE (FLAGYL) IVPB 500 mg  Status:  Discontinued        500 mg 100 mL/hr over 60 Minutes Intravenous Every 8 hours 04/21/20 2301 04/24/20 1219   04/21/20 2315  ciprofloxacin (CIPRO) IVPB 400 mg  Status:  Discontinued        400 mg 200 mL/hr over 60 Minutes Intravenous Every 12 hours 04/21/20 2306 04/24/20 1219        Subjective:  When seen this morning, patient reported that she was having frequent urges fortification but no significant stools, only  "squirts" with some tenesmus, no blood and even saw some stool.  No abdominal pain.  Tolerating diet.  Objective:   Vitals:   04/27/20 1341 04/27/20 2054 04/28/20 0404 04/28/20 1425  BP: (!) 155/79 (!) 152/80 (!) 151/86 (!) 159/79  Pulse: (!) 101 100 81 100  Resp: 16 16 19 17   Temp: 98.4 F (36.9 C) 98.2 F (36.8 C) 98.6 F (37 C) 98.1 F (36.7 C)  TempSrc: Oral Oral Oral Oral  SpO2: 95% 95% 96% 95%  Weight:      Height:        General exam: Elderly female, moderately built and overweight, lying comfortably propped up in bed.  Oral mucosa moist. Respiratory system: Clear to auscultation.  No increased work of breathing. Cardiovascular system: S1 & S2 heard, RRR. No JVD, murmurs, rubs, gallops or clicks. No pedal edema. Gastrointestinal system: Abdomen is minimally distended  but less so compared to yesterday, tender in left lower quadrant on deep palpation but no guarding, rigidity or rebound.  No organomegaly or masses appreciated.  Normal bowel sounds heard. Central nervous system: Alert and oriented. No focal neurological deficits.  Extremely hard of hearing. Extremities: Symmetric 5 x 5 power. Skin: No rashes, lesions or ulcers Psychiatry: Judgement and insight appear normal. Mood & affect pleasant and appropriate.     Data Reviewed:   I have personally reviewed following labs and imaging studies   CBC: Recent Labs  Lab 04/26/20 0222 04/27/20 0235 04/28/20 0031  WBC 8.0 9.9 9.0  HGB 9.2* 10.6* 9.9*  HCT 26.3* 30.8* 29.3*  MCV 91.0 90.1 90.2  PLT 418* 457* 502*    Basic Metabolic Panel: Recent Labs  Lab 04/26/20 0222 04/27/20 0235 04/28/20 0031  NA 131* 130* 132*  K 3.8 3.5 3.7  CL 100 97* 102  CO2 23 23 23   GLUCOSE 100* 157* 153*  BUN 9 11 17   CREATININE 0.60 0.87 0.83  CALCIUM 7.9* 8.0* 7.9*    Liver Function Tests: Recent Labs  Lab 04/22/20 0353  AST 16  ALT 17  ALKPHOS 63  BILITOT 0.7  PROT 5.4*  ALBUMIN 2.2*    CBG: No results for  input(s): GLUCAP in the last 168 hours.  Microbiology Studies:   Recent Results (from the past 240 hour(s))  Resp Panel by RT-PCR (Flu A&B, Covid) Nasopharyngeal Swab     Status: None   Collection Time: 04/21/20  5:19 PM   Specimen: Nasopharyngeal Swab; Nasopharyngeal(NP) swabs in vial transport medium  Result Value Ref Range Status   SARS Coronavirus 2 by RT PCR NEGATIVE NEGATIVE Final    Comment: (NOTE) SARS-CoV-2 target nucleic acids are NOT DETECTED.  The SARS-CoV-2 RNA is generally detectable in upper respiratory specimens during the acute phase of infection. The lowest concentration of SARS-CoV-2 viral copies this assay can detect is 138 copies/mL. A negative result does not preclude SARS-Cov-2 infection and should not be used as the sole basis for treatment or other patient management decisions. A negative result may occur with  improper specimen collection/handling, submission of specimen other than nasopharyngeal swab, presence of viral mutation(s) within the areas targeted by this assay, and inadequate number of viral copies(<138 copies/mL). A negative result must be combined with clinical observations, patient history, and epidemiological information. The expected result is Negative.  Fact Sheet for Patients:  EntrepreneurPulse.com.au  Fact Sheet for Healthcare Providers:  IncredibleEmployment.be  This test is no t yet approved or cleared by the [place] FDA and  has been authorized for detection and/or diagnosis of SARS-CoV-2 by FDA under an Emergency Use Authorization (EUA). This EUA will remain  in effect (meaning this test can be used) for the duration of the COVID-19 declaration under Section 564(b)(1) of the Act, 21 U.S.C.section 360bbb-3(b)(1), unless the authorization is terminated  or revoked sooner.       Influenza A by PCR NEGATIVE NEGATIVE Final   Influenza B by PCR NEGATIVE NEGATIVE Final    Comment:  (NOTE) The Xpert Xpress SARS-CoV-2/FLU/RSV plus assay is intended as an aid in the diagnosis of influenza from Nasopharyngeal swab specimens and should not be used as a sole basis for treatment. Nasal washings and aspirates are unacceptable for Xpert Xpress SARS-CoV-2/FLU/RSV testing.  Fact Sheet for Patients: EntrepreneurPulse.com.au  Fact Sheet for Healthcare Providers: IncredibleEmployment.be  This test is not yet approved or cleared by the [place] and has been authorized for  detection and/or diagnosis of SARS-CoV-2 by FDA under an Emergency Use Authorization (EUA). This EUA will remain in effect (meaning this test can be used) for the duration of the COVID-19 declaration under Section 564(b)(1) of the Act, 21 U.S.C. section 360bbb-3(b)(1), unless the authorization is terminated or revoked.  Performed at [facility], [place] [address] [place], [place] [address]   Gastrointestinal Panel by PCR , Stool     Status: None   Collection Time: 04/21/20  9:56 PM   Specimen: Stool  Result Value Ref Range Status   Campylobacter species NOT DETECTED NOT DETECTED Final   Plesimonas shigelloides NOT DETECTED NOT DETECTED Final   Salmonella species NOT DETECTED NOT DETECTED Final   Yersinia enterocolitica NOT DETECTED NOT DETECTED Final   Vibrio species NOT DETECTED NOT DETECTED Final   Vibrio cholerae NOT DETECTED NOT DETECTED Final   Enteroaggregative E coli (EAEC) NOT DETECTED NOT DETECTED Final   Enteropathogenic E coli (EPEC) NOT DETECTED NOT DETECTED Final   Enterotoxigenic E coli (ETEC) NOT DETECTED NOT DETECTED Final   Shiga like toxin producing E coli (STEC) NOT DETECTED NOT DETECTED Final   Shigella/Enteroinvasive E coli (EIEC) NOT DETECTED NOT DETECTED Final   Cryptosporidium NOT DETECTED NOT DETECTED Final   Cyclospora cayetanensis NOT DETECTED NOT DETECTED Final   Entamoeba histolytica NOT DETECTED NOT DETECTED Final    Giardia lamblia NOT DETECTED NOT DETECTED Final   Adenovirus F40/41 NOT DETECTED NOT DETECTED Final   Astrovirus NOT DETECTED NOT DETECTED Final   Norovirus GI/GII NOT DETECTED NOT DETECTED Final   Rotavirus A NOT DETECTED NOT DETECTED Final   Sapovirus (I, II, IV, and V) NOT DETECTED NOT DETECTED Final    Comment: Performed at [facility], [address] [place], [place] [address]  C Difficile Quick Screen (NO PCR Reflex)     Status: None   Collection Time: 04/21/20  9:56 PM   Specimen: Stool  Result Value Ref Range Status   C Diff antigen NEGATIVE NEGATIVE Final   C Diff toxin NEGATIVE NEGATIVE Final   C Diff interpretation No C. difficile detected.  Final    Comment: Performed at [facility], [place] [address], [place], [place] [address]     Radiology Studies:  No results found.   Scheduled Meds:   . budesonide  9 mg Oral Daily  . Gerhardt's butt cream   Topical QID  . mesalamine  4.8 g Oral Q breakfast  . mesalamine  4 g Rectal QHS  . pantoprazole (PROTONIX) IV  40 mg Intravenous Q12H  . [START ON 04/29/2020] predniSONE  40 mg Oral Q breakfast    Continuous Infusions:   . sodium chloride Stopped (04/28/20 1037)     LOS: 7 days     Vernell Leep, MD, [place], [facility]. [facility]    To contact the attending provider between 7A-7P or the covering provider during after hours 7P-7A, please log into the web site [email or web address] and access using universal [place] password for that web site. If you do not have the password, please call the hospital operator.  04/28/2020, 4:46 PM

## 2020-04-29 ENCOUNTER — Inpatient Hospital Stay (HOSPITAL_COMMUNITY): Payer: Medicare Other

## 2020-04-29 DIAGNOSIS — K5641 Fecal impaction: Secondary | ICD-10-CM

## 2020-04-29 LAB — BASIC METABOLIC PANEL
Anion gap: 8 (ref 5–15)
BUN: 18 mg/dL (ref 8–23)
CO2: 26 mmol/L (ref 22–32)
Calcium: 7.9 mg/dL — ABNORMAL LOW (ref 8.9–10.3)
Chloride: 101 mmol/L (ref 98–111)
Creatinine, Ser: 0.69 mg/dL (ref 0.44–1.00)
GFR, Estimated: >60 mL/min (ref >60)
Glucose, Bld: 119 mg/dL — ABNORMAL HIGH (ref 70–99)
Potassium: 3.2 mmol/L — ABNORMAL LOW (ref 3.5–5.1)
Sodium: 135 mmol/L (ref 135–145)

## 2020-04-29 LAB — MAGNESIUM: Magnesium: 1.4 mg/dL — ABNORMAL LOW (ref 1.7–2.4)

## 2020-04-29 MED ORDER — MILK AND MOLASSES ENEMA
1.0000 | Freq: Once | RECTAL | Status: AC
  Administered 2020-04-29: 240 mL via RECTAL
  Filled 2020-04-29: qty 240

## 2020-04-29 MED ORDER — MAGNESIUM SULFATE 4 GM/100ML IV SOLN
4.0000 g | Freq: Once | INTRAVENOUS | Status: AC
  Administered 2020-04-29: 17:00:00 4 g via INTRAVENOUS
  Filled 2020-04-29: qty 100

## 2020-04-29 MED ORDER — AMLODIPINE BESYLATE 5 MG PO TABS
5.0000 mg | ORAL_TABLET | Freq: Every day | ORAL | Status: DC
  Administered 2020-04-29 – 2020-05-01 (×3): 5 mg via ORAL
  Filled 2020-04-29 (×3): qty 1

## 2020-04-29 MED ORDER — POTASSIUM CHLORIDE CRYS ER 20 MEQ PO TBCR
40.0000 meq | EXTENDED_RELEASE_TABLET | ORAL | Status: AC
  Administered 2020-04-29 (×2): 40 meq via ORAL
  Filled 2020-04-29 (×2): qty 2

## 2020-04-29 MED ORDER — POLYETHYLENE GLYCOL 3350 17 G PO PACK
17.0000 g | PACK | Freq: Two times a day (BID) | ORAL | Status: DC
  Administered 2020-04-29 – 2020-04-30 (×3): 17 g via ORAL
  Filled 2020-04-29 (×3): qty 1

## 2020-04-29 NOTE — Progress Notes (Signed)
Potassium is down to 3.2 but is being supplemented.  KUB this morning appears to show an ileus pattern with scattered small and large bowel gas, without dilatation or evidence of obstruction (my interpretation, official report not ready yet).  The patient continues to do about the same, with frequent small squirts of liquid stool, and a sensation of painful fecal obstipation at the rectal outlet.  Of note, the patient states she has longstanding constipation and requires ongoing use of senna and other laxatives to maintain defecation at home.  Rectal exam shows a moderately large, soft fecal impaction, with liquid light tan stool (no visible blood) oozing around the examining finger.  Impression:  1.  Distal ulcerative colitis, under treatment, with apparent response based on resolution of blood in the stool  2.  Fecal impaction in the setting of chronic constipation, somewhat paradoxical given the patient's colitis  Plan:  1.  Milk of molasses enema for relief of fecal impaction  2.  Continue current management for colitis, watching for worsening now that patient is on oral steroids  3.  Case discussed with Dr. Aldean Jewett, M.D. Pager [phone] If no answer or after 5 PM call [phone]

## 2020-04-29 NOTE — Progress Notes (Signed)
PROGRESS NOTE   Crystal Ramsey  VQM:[id number]    DOB: 1936/01/29    DOA: 04/21/2020  PCP: Lavone Orn, MD   I have briefly reviewed patients previous medical records in [facility].  Chief Complaint  Patient presents with  . Abdominal Pain    Brief Narrative:  84 year old female with history of DJD/chronic back pain, chronic constipation, GERD, developed diarrhea with blood specks about 10 to 12 days prior to admission while she was vacationing in [place].  She was seen in an ER there and decided to come back to [place].  Diarrhea briefly improved for couple of days and then worsened again 3 days prior to admission along with specks of bright red blood.  She was seen at [place] office and advised to come to the ED.  In the ED, hemoglobin around 10, serum sodium 124, mild hypokalemia, CT abdomen and pelvis noted thickening of the sigmoid colon concerning for sigmoid colitis.  GI consulted, s/p flexible sigmoidoscopy and biopsy, consistent with ulcerative colitis, treating with parenteral and topical/enteral steroids.  Significant rectal pain since last evening, GI seen, treating for fecal impaction.   Assessment & Plan:  Principal Problem:   GI bleed Active Problems:   Hypertension   OA (osteoarthritis) of hip   Hyponatremia   Hypokalemia   Colitis, acute   Bloody stool   Newly diagnosed severe left-sided/rectosigmoid ulcerative colitis:  Patient presented with painless hematochezia and diarrhea.  CT concerning for sigmoid colitis  C. difficile PCR and GI panel PCR negative.  Empirically started on IV Cipro and metronidazole.  GI consulted, s/p flexible sigmoidoscopy with findings as noted below.  Initial concern for ischemic versus inflammatory colitis.  GI continues to follow.  Treating for distal ulcerative colitis.  As per GI follow-up today,steroids being changed to oral prednisone 40 mg daily, continuing budesonide, oral mesalamine and mesalamine  enemas, metronidazole stopped.  Improving from the standpoint.  Decreased diarrhea frequency and volume.  KUB without acute abnormalities.  Fecal impaction  Significant rectal pain since last evening, GI seen, treating for fecal impaction.  MiraLAX and milk of molasses enema.  Discussed with Dr. Cristina Gong  GERD  Continue Protonix 40 mg IV twice daily, can transition to oral at discharge.  Dehydration with hyponatremia  Hyponatremia likely due to GI losses, poor oral intake and HCTZ.  HCTZ on hold.  Resolved after IV fluids.  Stopped IV fluids.  Essential hypertension  Mildly uncontrolled.  Home dose of amiloride and HCTZ held and likely will DC indefinitely at discharge due to significant hyponatremia and risk for recurrence of same in the elderly patient.  Continue hydralazine orally as needed.  Added amlodipine 5 mg daily.  Hypokalemia/hypomagnesemia  Replace and follow.  Chronic pain/osteoarthritis  Supportive/symptomatic treatment with home regimen of hydrocodone.  Anemia of chronic disease  Hemoglobin stable in the 9 g range.  Follow CBC periodically and transfuse if hemoglobin 7 g or less.  Body mass index is 30.42 kg/m.    DVT prophylaxis: SCDs Start: 04/21/20 2220     Code Status: Full Code Family Communication: None at bedside.   Disposition:  Status is: Inpatient  Remains inpatient appropriate because:Inpatient level of care appropriate due to severity of illness   Dispo: The patient is from: Home              Anticipated d/c is to: Home              Anticipated d/c date is: 3 days as  per GI follow-up, discharge is still several days away.              Patient currently is not medically stable to d/c.        Consultants:   [facility].  Procedures:   Flexible sigmoidoscopy 04/25/2020:  Impression:  - Preparation of the colon was fair. - Congested, erythematous, eroded, friable (with spontaneous bleeding), granular, hemorrhagic,  inflamed, nodular, ulcerated, vascular-pattern-decreased and thickened folds of the mucosa in the rectum, in the recto-sigmoid colon and in the sigmoid colon. Biopsied. - Endoscopic findings concerning for ischemic colitis but ulcerative colitis also in differential.  Pathology report:  FINAL MICROSCOPIC DIAGNOSIS:   A. COLON, SIGMOID AND RECTUM, BIOPSY:  - Chronic severally active colitis.  - No dysplasia or malignancy.   COMMENT:   The features are consistent with inflammatory bowel disease in the  proper clinical setting. Ischemic changes are not seen.   Antimicrobials:    Anti-infectives (From admission, onward)   Start     Dose/Rate Route Frequency Ordered Stop   04/25/20 2200  ciprofloxacin (CIPRO) IVPB 200 mg  Status:  Discontinued        200 mg 100 mL/hr over 60 Minutes Intravenous Every 12 hours 04/25/20 1426 04/26/20 1439   04/25/20 1500  metroNIDAZOLE (FLAGYL) IVPB 250 mg 50 mL  Status:  Discontinued        250 mg 50 mL/hr over 60 Minutes Intravenous Every 8 hours 04/25/20 1426 04/28/20 1039   04/24/20 1400  metroNIDAZOLE (FLAGYL) tablet 500 mg  Status:  Discontinued        500 mg Oral Every 8 hours 04/24/20 1219 04/25/20 1426   04/24/20 1315  ciprofloxacin (CIPRO) tablet 500 mg  Status:  Discontinued        500 mg Oral 2 times daily 04/24/20 1219 04/25/20 1426   04/23/20 0000  ciprofloxacin (CIPRO) 250 MG tablet        250 mg Oral 2 times daily 04/23/20 1008 04/28/20 2359   04/23/20 0000  metroNIDAZOLE (FLAGYL) 250 MG tablet        250 mg Oral 3 times daily 04/23/20 1008 04/28/20 2359   04/21/20 2330  metroNIDAZOLE (FLAGYL) IVPB 500 mg  Status:  Discontinued        500 mg 100 mL/hr over 60 Minutes Intravenous Every 8 hours 04/21/20 2301 04/24/20 1219   04/21/20 2315  ciprofloxacin (CIPRO) IVPB 400 mg  Status:  Discontinued        400 mg 200 mL/hr over 60 Minutes Intravenous Every 12 hours 04/21/20 2306 04/24/20 1219        Subjective:  Ongoing intermittent  episodes of severe rectal pain since last evening, feels as though there is stool stuck there.  Scared to eat so as to make her constipation worse.  No vomiting.  Objective:   Vitals:   04/28/20 2045 04/29/20 0415 04/29/20 0416 04/29/20 1454  BP: (!) 162/84 (!) 171/80 (!) 171/80 (!) 156/79  Pulse: (!) 105 81 80 89  Resp:    18  Temp: 98.2 F (36.8 C) 97.8 F (36.6 C) 97.8 F (36.6 C) 98.6 F (37 C)  TempSrc: Oral Oral Oral Oral  SpO2: 94% 93% 93% 95%  Weight:      Height:        General exam: Elderly female, moderately built and overweight, lying comfortably in bed in left lateral position. Had just received mesalamine enema. Respiratory system: Clear to auscultation.  No increased work of breathing. Cardiovascular system:  S1 & S2 heard, RRR. No JVD, murmurs, rubs, gallops or clicks. No pedal edema. Gastrointestinal system: Abdomen is not distended, soft and nontender.  No organomegaly or masses appreciated.  Normal bowel sounds heard. Central nervous system: Alert and oriented. No focal neurological deficits.  Extremely hard of hearing. Extremities: Symmetric 5 x 5 power. Skin: No rashes, lesions or ulcers Psychiatry: Judgement and insight appear normal. Mood & affect pleasant and appropriate.     Data Reviewed:   I have personally reviewed following labs and imaging studies   CBC: Recent Labs  Lab 04/26/20 0222 04/27/20 0235 04/28/20 0031  WBC 8.0 9.9 9.0  HGB 9.2* 10.6* 9.9*  HCT 26.3* 30.8* 29.3*  MCV 91.0 90.1 90.2  PLT 418* 457* 502*    Basic Metabolic Panel: Recent Labs  Lab 04/27/20 0235 04/28/20 0031 04/29/20 0456  NA 130* 132* 135  K 3.5 3.7 3.2*  CL 97* 102 101  CO2 23 23 26   GLUCOSE 157* 153* 119*  BUN 11 17 18   CREATININE 0.87 0.83 0.69  CALCIUM 8.0* 7.9* 7.9*  MG  --   --  1.4*    Liver Function Tests: No results for input(s): AST, ALT, ALKPHOS, BILITOT, PROT, ALBUMIN in the last 168 hours.  CBG: No results for input(s): GLUCAP in the  last 168 hours.  Microbiology Studies:   Recent Results (from the past 240 hour(s))  Resp Panel by RT-PCR (Flu A&B, Covid) Nasopharyngeal Swab     Status: None   Collection Time: 04/21/20  5:19 PM   Specimen: Nasopharyngeal Swab; Nasopharyngeal(NP) swabs in vial transport medium  Result Value Ref Range Status   SARS Coronavirus 2 by RT PCR NEGATIVE NEGATIVE Final    Comment: (NOTE) SARS-CoV-2 target nucleic acids are NOT DETECTED.  The SARS-CoV-2 RNA is generally detectable in upper respiratory specimens during the acute phase of infection. The lowest concentration of SARS-CoV-2 viral copies this assay can detect is 138 copies/mL. A negative result does not preclude SARS-Cov-2 infection and should not be used as the sole basis for treatment or other patient management decisions. A negative result may occur with  improper specimen collection/handling, submission of specimen other than nasopharyngeal swab, presence of viral mutation(s) within the areas targeted by this assay, and inadequate number of viral copies(<138 copies/mL). A negative result must be combined with clinical observations, patient history, and epidemiological information. The expected result is Negative.  Fact Sheet for Patients:  EntrepreneurPulse.com.au  Fact Sheet for Healthcare Providers:  IncredibleEmployment.be  This test is no t yet approved or cleared by the [place] FDA and  has been authorized for detection and/or diagnosis of SARS-CoV-2 by FDA under an Emergency Use Authorization (EUA). This EUA will remain  in effect (meaning this test can be used) for the duration of the COVID-19 declaration under Section 564(b)(1) of the Act, 21 U.S.C.section 360bbb-3(b)(1), unless the authorization is terminated  or revoked sooner.       Influenza A by PCR NEGATIVE NEGATIVE Final   Influenza B by PCR NEGATIVE NEGATIVE Final    Comment: (NOTE) The Xpert Xpress  SARS-CoV-2/FLU/RSV plus assay is intended as an aid in the diagnosis of influenza from Nasopharyngeal swab specimens and should not be used as a sole basis for treatment. Nasal washings and aspirates are unacceptable for Xpert Xpress SARS-CoV-2/FLU/RSV testing.  Fact Sheet for Patients: EntrepreneurPulse.com.au  Fact Sheet for Healthcare Providers: IncredibleEmployment.be  This test is not yet approved or cleared by the [place] FDA and has been  authorized for detection and/or diagnosis of SARS-CoV-2 by FDA under an Emergency Use Authorization (EUA). This EUA will remain in effect (meaning this test can be used) for the duration of the COVID-19 declaration under Section 564(b)(1) of the Act, 21 U.S.C. section 360bbb-3(b)(1), unless the authorization is terminated or revoked.  Performed at [facility], [place] [address] [place], [place] [address]   Gastrointestinal Panel by PCR , Stool     Status: None   Collection Time: 04/21/20  9:56 PM   Specimen: Stool  Result Value Ref Range Status   Campylobacter species NOT DETECTED NOT DETECTED Final   Plesimonas shigelloides NOT DETECTED NOT DETECTED Final   Salmonella species NOT DETECTED NOT DETECTED Final   Yersinia enterocolitica NOT DETECTED NOT DETECTED Final   Vibrio species NOT DETECTED NOT DETECTED Final   Vibrio cholerae NOT DETECTED NOT DETECTED Final   Enteroaggregative E coli (EAEC) NOT DETECTED NOT DETECTED Final   Enteropathogenic E coli (EPEC) NOT DETECTED NOT DETECTED Final   Enterotoxigenic E coli (ETEC) NOT DETECTED NOT DETECTED Final   Shiga like toxin producing E coli (STEC) NOT DETECTED NOT DETECTED Final   Shigella/Enteroinvasive E coli (EIEC) NOT DETECTED NOT DETECTED Final   Cryptosporidium NOT DETECTED NOT DETECTED Final   Cyclospora cayetanensis NOT DETECTED NOT DETECTED Final   Entamoeba histolytica NOT DETECTED NOT DETECTED Final   Giardia lamblia NOT DETECTED  NOT DETECTED Final   Adenovirus F40/41 NOT DETECTED NOT DETECTED Final   Astrovirus NOT DETECTED NOT DETECTED Final   Norovirus GI/GII NOT DETECTED NOT DETECTED Final   Rotavirus A NOT DETECTED NOT DETECTED Final   Sapovirus (I, II, IV, and V) NOT DETECTED NOT DETECTED Final    Comment: Performed at [facility], [address] [place], [place] [address]  C Difficile Quick Screen (NO PCR Reflex)     Status: None   Collection Time: 04/21/20  9:56 PM   Specimen: Stool  Result Value Ref Range Status   C Diff antigen NEGATIVE NEGATIVE Final   C Diff toxin NEGATIVE NEGATIVE Final   C Diff interpretation No C. difficile detected.  Final    Comment: Performed at [facility], [place] [address], [place], [place] [address]     Radiology Studies:  DG Abd Portable 1V  Result Date: 04/29/2020 CLINICAL DATA:  Abdominal distension. Patient was having diarrhea before change to constipation over the last 1-1/2 days. EXAM: PORTABLE ABDOMEN - 1 VIEW COMPARISON:  None. FINDINGS: Bilateral hip replacements partially visualized. No evidence of obstruction. No free air, portal venous gas, or pneumatosis seen on supine imaging. Mild opacity in left base, incompletely evaluated. IMPRESSION: 1. No acute intra-abdominal abnormalities. 2. Mild opacity in left base is incompletely evaluated but could represent atelectasis or pneumonia. Recommend clinical correlation. Electronically Signed   By: Dorise Bullion III M.D   On: 04/29/2020 11:56     Scheduled Meds:   . budesonide  9 mg Oral Daily  . Gerhardt's butt cream   Topical QID  . mesalamine  4.8 g Oral Q breakfast  . mesalamine  4 g Rectal QHS  . pantoprazole (PROTONIX) IV  40 mg Intravenous Q12H  . polyethylene glycol  17 g Oral BID  . predniSONE  40 mg Oral Q breakfast    Continuous Infusions:   . sodium chloride 50 mL/hr at 04/29/20 1347     LOS: 8 days     Vernell Leep, MD, [place], [facility]. [facility]  To contact the  attending provider between 7A-7P or the covering provider during after hours 7P-7A, please log into the web site [email or web address] and access using universal [place] password for that web site. If you do not have the password, please call the hospital operator.  04/29/2020, 3:35 PM

## 2020-04-29 NOTE — Plan of Care (Signed)

## 2020-04-30 LAB — MAGNESIUM: Magnesium: 2.1 mg/dL (ref 1.7–2.4)

## 2020-04-30 LAB — BASIC METABOLIC PANEL
Anion gap: 11 (ref 5–15)
BUN: 11 mg/dL (ref 8–23)
CO2: 24 mmol/L (ref 22–32)
Calcium: 7.7 mg/dL — ABNORMAL LOW (ref 8.9–10.3)
Chloride: 99 mmol/L (ref 98–111)
Creatinine, Ser: 0.72 mg/dL (ref 0.44–1.00)
GFR, Estimated: >60 mL/min (ref >60)
Glucose, Bld: 108 mg/dL — ABNORMAL HIGH (ref 70–99)
Potassium: 3.7 mmol/L (ref 3.5–5.1)
Sodium: 134 mmol/L — ABNORMAL LOW (ref 135–145)

## 2020-04-30 LAB — CBC
HCT: 27.8 % — ABNORMAL LOW (ref 36.0–46.0)
Hemoglobin: 9.7 g/dL — ABNORMAL LOW (ref 12.0–15.0)
MCH: 31.3 pg (ref 26.0–34.0)
MCHC: 34.9 g/dL (ref 30.0–36.0)
MCV: 89.7 fL (ref 80.0–100.0)
Platelets: 427 10*3/uL — ABNORMAL HIGH (ref 150–400)
RBC: 3.1 MIL/uL — ABNORMAL LOW (ref 3.87–5.11)
RDW: 13.4 % (ref 11.5–15.5)
WBC: 5.5 10*3/uL (ref 4.0–10.5)
nRBC: 0 % (ref 0.0–0.2)

## 2020-04-30 MED ORDER — POLYETHYLENE GLYCOL 3350 17 G PO PACK
17.0000 g | PACK | Freq: Every day | ORAL | Status: DC
  Administered 2020-04-30 – 2020-05-01 (×2): 17 g via ORAL
  Filled 2020-04-30: qty 1

## 2020-04-30 MED ORDER — PANTOPRAZOLE SODIUM 40 MG PO TBEC
40.0000 mg | DELAYED_RELEASE_TABLET | Freq: Every day | ORAL | Status: DC
  Administered 2020-05-01: 11:00:00 40 mg via ORAL
  Filled 2020-04-30: qty 1

## 2020-04-30 MED ORDER — MESALAMINE 1000 MG RE SUPP
1000.0000 mg | Freq: Every day | RECTAL | Status: DC
  Administered 2020-05-01: 11:00:00 1000 mg via RECTAL
  Filled 2020-04-30: qty 1

## 2020-04-30 MED ORDER — PREDNISONE 20 MG PO TABS
20.0000 mg | ORAL_TABLET | Freq: Every day | ORAL | Status: DC
Start: 2020-05-01 — End: 2020-05-01
  Administered 2020-05-01: 11:00:00 20 mg via ORAL
  Filled 2020-04-30: qty 1

## 2020-04-30 NOTE — Progress Notes (Signed)
The patient is feeling better.  She feels like she passed her fecal impaction yesterday and now has much greater comfort in the anal area when she has bowel movements.  However, she continues to have diarrhea throughout the night, roughly every 2 hours.  Moreover, she feels she is getting weaker in the hospital, presumably a consequence of prolonged bedrest and interruption of sleep.  On exam, the patient is quite chipper and in good spirits.  The abdomen is mildly adipose and slightly distended but soft and without tenderness.  I repeated her rectal exam today, and the rectal ampulla is empty, confirming resolution of her fecal impaction noted yesterday.  No blood evident on the examining glove.  Discussion:  The fact that she continues to have frequent small-volume bowel movements, despite resolution of her fecal impaction, means that this was not simply "overflow" diarrhea.  It could be the result of the MiraLAX we are giving her, or it could be due to persistent colitis.  Recommendations:  1.  Potassium has been repleted and supplementation has been discontinued  2.  Okay to decrease prednisone (done)  3.  Continue budesonide, oral mesalamine, and mesalamine enemas  4.  Decrease MiraLAX dose to prevent "overshoot" diarrhea (done)  5.  For now, add mesalamine suppository in the morning, in addition to the mesalamine enema she is already on at night  6.  I think discharge tomorrow is reasonable if she continues to do okay.  The patient herself thinks that she would do better at home.  Follow-up with Dr. Michail Sermon will need to be arranged for approximately 1 to 2 weeks post discharge.  In particular, if there is going to be a delay in outpatient follow-up, I think that a further prednisone taper would be appropriate, since she is getting topical steroids in the form of budesonide.  Ideally, she would go home on Uceris rather than Entocort, because the Uceris is an extended release preparation  that covers the entire colon, whereas Entocort tends to be released primarily in the proximal colon.  However, depending on the patient's insurance, Uceris could be prohibitively expensive ($3000 per month).  Cleotis Nipper, M.D. Pager [phone] If no answer or after 5 PM call [phone]

## 2020-04-30 NOTE — Progress Notes (Signed)
PROGRESS NOTE   Crystal Ramsey  EQA:[id number]    DOB: 1936-01-23    DOA: 04/21/2020  PCP: Lavone Orn, MD   I have briefly reviewed patients previous medical records in [facility].  Chief Complaint  Patient presents with  . Abdominal Pain    Brief Narrative:  84 year old female with history of DJD/chronic back pain, chronic constipation, GERD, developed diarrhea with blood specks about 10 to 12 days prior to admission while she was vacationing in [place].  She was seen in an ER there and decided to come back to [place].  Diarrhea briefly improved for couple of days and then worsened again 3 days prior to admission along with specks of bright red blood.  She was seen at [place] office and advised to come to the ED.  In the ED, hemoglobin around 10, serum sodium 124, mild hypokalemia, CT abdomen and pelvis noted thickening of the sigmoid colon concerning for sigmoid colitis.  GI consulted, s/p flexible sigmoidoscopy and biopsy, consistent with ulcerative colitis, treating with parenteral and topical/enteral steroids.  Significant rectal pain since last evening, GI seen, treating for fecal impaction.   Assessment & Plan:  Principal Problem:   GI bleed Active Problems:   Hypertension   OA (osteoarthritis) of hip   Hyponatremia   Hypokalemia   Colitis, acute   Bloody stool   Newly diagnosed severe left-sided/rectosigmoid ulcerative colitis:  Patient presented with painless hematochezia and diarrhea.  CT concerning for sigmoid colitis  C. difficile PCR and GI panel PCR negative.  Empirically started on IV Cipro and metronidazole.  GI consulted, s/p flexible sigmoidoscopy with findings as noted below.  Initial concern for ischemic versus inflammatory colitis.  GI continues to follow.  Treating for distal ulcerative colitis.  As per GI follow-up today,steroids being changed to oral prednisone 40 mg daily, continuing budesonide, oral mesalamine and mesalamine  enemas, metronidazole stopped.  Improving from the standpoint.  Decreased diarrhea frequency and volume.  KUB without acute abnormalities.  Continues to improve.  GI following and have made medication adjustments.  Tapering prednisone.  Indicate that she could be stable for DC tomorrow but will reassess.  Fecal impaction  Significant rectal pain since last evening, GI seen, treating for fecal impaction.  MiraLAX and milk of molasses enema.  Resolved after enema.  MiraLAX decreased to avoid diarrhea related to it.  GERD  Continue Protonix 40 mg IV twice daily, can transition to oral at discharge.  Dehydration with hyponatremia  Hyponatremia likely due to GI losses, poor oral intake and HCTZ.  HCTZ on hold.  Resolved after IV fluids.  Stopped IV fluids.  Essential hypertension  Mildly uncontrolled.  Home dose of amiloride and HCTZ held and likely will DC indefinitely at discharge due to significant hyponatremia and risk for recurrence of same in the elderly patient.  Continue hydralazine orally as needed.  Added amlodipine 5 mg daily.  Better.  Hypokalemia/hypomagnesemia  Replaced  Chronic pain/osteoarthritis  Supportive/symptomatic treatment with home regimen of hydrocodone.  Anemia of chronic disease  Hemoglobin stable in the 9 g range.  Follow CBC periodically and transfuse if hemoglobin 7 g or less.  Body mass index is 30.42 kg/m.    DVT prophylaxis: SCDs Start: 04/21/20 2220     Code Status: Full Code Family Communication: None at bedside.   Disposition:  Status is: Inpatient  Remains inpatient appropriate because:Inpatient level of care appropriate due to severity of illness   Dispo: The patient is from: Home  Anticipated d/c is to: Home              Anticipated d/c date is: [place] for 12/20.  Since patient reports weakness, PT consulted to evaluate.  Advised patient that she should not get out of bed without nursing supervision  and assistance.  She verbalized understanding.              Patient currently is not medically stable to d/c.        Consultants:   [facility].  Procedures:   Flexible sigmoidoscopy 04/25/2020:  Impression:  - Preparation of the colon was fair. - Congested, erythematous, eroded, friable (with spontaneous bleeding), granular, hemorrhagic, inflamed, nodular, ulcerated, vascular-pattern-decreased and thickened folds of the mucosa in the rectum, in the recto-sigmoid colon and in the sigmoid colon. Biopsied. - Endoscopic findings concerning for ischemic colitis but ulcerative colitis also in differential.  Pathology report:  FINAL MICROSCOPIC DIAGNOSIS:   A. COLON, SIGMOID AND RECTUM, BIOPSY:  - Chronic severally active colitis.  - No dysplasia or malignancy.   COMMENT:   The features are consistent with inflammatory bowel disease in the  proper clinical setting. Ischemic changes are not seen.   Antimicrobials:    Anti-infectives (From admission, onward)   Start     Dose/Rate Route Frequency Ordered Stop   04/25/20 2200  ciprofloxacin (CIPRO) IVPB 200 mg  Status:  Discontinued        200 mg 100 mL/hr over 60 Minutes Intravenous Every 12 hours 04/25/20 1426 04/26/20 1439   04/25/20 1500  metroNIDAZOLE (FLAGYL) IVPB 250 mg 50 mL  Status:  Discontinued        250 mg 50 mL/hr over 60 Minutes Intravenous Every 8 hours 04/25/20 1426 04/28/20 1039   04/24/20 1400  metroNIDAZOLE (FLAGYL) tablet 500 mg  Status:  Discontinued        500 mg Oral Every 8 hours 04/24/20 1219 04/25/20 1426   04/24/20 1315  ciprofloxacin (CIPRO) tablet 500 mg  Status:  Discontinued        500 mg Oral 2 times daily 04/24/20 1219 04/25/20 1426   04/23/20 0000  ciprofloxacin (CIPRO) 250 MG tablet        250 mg Oral 2 times daily 04/23/20 1008 04/28/20 2359   04/23/20 0000  metroNIDAZOLE (FLAGYL) 250 MG tablet        250 mg Oral 3 times daily 04/23/20 1008 04/28/20 2359   04/21/20 2330  metroNIDAZOLE  (FLAGYL) IVPB 500 mg  Status:  Discontinued        500 mg 100 mL/hr over 60 Minutes Intravenous Every 8 hours 04/21/20 2301 04/24/20 1219   04/21/20 2315  ciprofloxacin (CIPRO) IVPB 400 mg  Status:  Discontinued        400 mg 200 mL/hr over 60 Minutes Intravenous Every 12 hours 04/21/20 2306 04/24/20 1219        Subjective:  Reports good BM after enema with resolution of fecal impaction.  Continue to have BMs every 2 hours overnight but these were low in volume and progressively more formed.  No blood.  No abdominal pain.  Reports some weakness when attempting to get up from bed.  Objective:   Vitals:   04/29/20 1454 04/29/20 2038 04/30/20 0439 04/30/20 1404  BP: (!) 156/79 (!) 160/78 (!) 167/74 (!) 163/80  Pulse: 89 92 87 99  Resp: 18 16 20 18   Temp: 98.6 F (37 C) 98.1 F (36.7 C) 98.2 F (36.8 C) 98 F (36.7 C)  TempSrc: Oral Oral Oral Oral  SpO2: 95% 93% 95% 95%  Weight:      Height:        General exam: Elderly female, moderately built and overweight, lying comfortably in bed in left lateral position.  Oral mucosa moist Respiratory system: Clear to auscultation.  No increased work of breathing. Cardiovascular system: S1 & S2 heard, RRR. No JVD, murmurs, rubs, gallops or clicks. No pedal edema. Gastrointestinal system: Abdomen remains nondistended, soft and nontender.  No organomegaly or masses appreciated.  Normal bowel sounds heard. Central nervous system: Alert and oriented. No focal neurological deficits.  Extremely hard of hearing. Extremities: Symmetric 5 x 5 power. Skin: No rashes, lesions or ulcers Psychiatry: Judgement and insight appear normal. Mood & affect pleasant and appropriate.     Data Reviewed:   I have personally reviewed following labs and imaging studies   CBC: Recent Labs  Lab 04/27/20 0235 04/28/20 0031 04/30/20 0149  WBC 9.9 9.0 5.5  HGB 10.6* 9.9* 9.7*  HCT 30.8* 29.3* 27.8*  MCV 90.1 90.2 89.7  PLT 457* 502* 427*    Basic  Metabolic Panel: Recent Labs  Lab 04/28/20 0031 04/29/20 0456 04/30/20 0149  NA 132* 135 134*  K 3.7 3.2* 3.7  CL 102 101 99  CO2 23 26 24   GLUCOSE 153* 119* 108*  BUN 17 18 11   CREATININE 0.83 0.69 0.72  CALCIUM 7.9* 7.9* 7.7*  MG  --  1.4* 2.1    Liver Function Tests: No results for input(s): AST, ALT, ALKPHOS, BILITOT, PROT, ALBUMIN in the last 168 hours.  CBG: No results for input(s): GLUCAP in the last 168 hours.  Microbiology Studies:   Recent Results (from the past 240 hour(s))  Resp Panel by RT-PCR (Flu A&B, Covid) Nasopharyngeal Swab     Status: None   Collection Time: 04/21/20  5:19 PM   Specimen: Nasopharyngeal Swab; Nasopharyngeal(NP) swabs in vial transport medium  Result Value Ref Range Status   SARS Coronavirus 2 by RT PCR NEGATIVE NEGATIVE Final    Comment: (NOTE) SARS-CoV-2 target nucleic acids are NOT DETECTED.  The SARS-CoV-2 RNA is generally detectable in upper respiratory specimens during the acute phase of infection. The lowest concentration of SARS-CoV-2 viral copies this assay can detect is 138 copies/mL. A negative result does not preclude SARS-Cov-2 infection and should not be used as the sole basis for treatment or other patient management decisions. A negative result may occur with  improper specimen collection/handling, submission of specimen other than nasopharyngeal swab, presence of viral mutation(s) within the areas targeted by this assay, and inadequate number of viral copies(<138 copies/mL). A negative result must be combined with clinical observations, patient history, and epidemiological information. The expected result is Negative.  Fact Sheet for Patients:  EntrepreneurPulse.com.au  Fact Sheet for Healthcare Providers:  IncredibleEmployment.be  This test is no t yet approved or cleared by the [place] FDA and  has been authorized for detection and/or diagnosis of SARS-CoV-2 by FDA  under an Emergency Use Authorization (EUA). This EUA will remain  in effect (meaning this test can be used) for the duration of the COVID-19 declaration under Section 564(b)(1) of the Act, 21 U.S.C.section 360bbb-3(b)(1), unless the authorization is terminated  or revoked sooner.       Influenza A by PCR NEGATIVE NEGATIVE Final   Influenza B by PCR NEGATIVE NEGATIVE Final    Comment: (NOTE) The Xpert Xpress SARS-CoV-2/FLU/RSV plus assay is intended as an aid in the diagnosis of influenza  from Nasopharyngeal swab specimens and should not be used as a sole basis for treatment. Nasal washings and aspirates are unacceptable for Xpert Xpress SARS-CoV-2/FLU/RSV testing.  Fact Sheet for Patients: EntrepreneurPulse.com.au  Fact Sheet for Healthcare Providers: IncredibleEmployment.be  This test is not yet approved or cleared by the [place] FDA and has been authorized for detection and/or diagnosis of SARS-CoV-2 by FDA under an Emergency Use Authorization (EUA). This EUA will remain in effect (meaning this test can be used) for the duration of the COVID-19 declaration under Section 564(b)(1) of the Act, 21 U.S.C. section 360bbb-3(b)(1), unless the authorization is terminated or revoked.  Performed at [facility], [place] [address], [place], [place] [address]   Gastrointestinal Panel by PCR , Stool     Status: None   Collection Time: 04/21/20  9:56 PM   Specimen: Stool  Result Value Ref Range Status   Campylobacter species NOT DETECTED NOT DETECTED Final   Plesimonas shigelloides NOT DETECTED NOT DETECTED Final   Salmonella species NOT DETECTED NOT DETECTED Final   Yersinia enterocolitica NOT DETECTED NOT DETECTED Final   Vibrio species NOT DETECTED NOT DETECTED Final   Vibrio cholerae NOT DETECTED NOT DETECTED Final   Enteroaggregative E coli (EAEC) NOT DETECTED NOT DETECTED Final   Enteropathogenic E coli (EPEC) NOT DETECTED NOT  DETECTED Final   Enterotoxigenic E coli (ETEC) NOT DETECTED NOT DETECTED Final   Shiga like toxin producing E coli (STEC) NOT DETECTED NOT DETECTED Final   Shigella/Enteroinvasive E coli (EIEC) NOT DETECTED NOT DETECTED Final   Cryptosporidium NOT DETECTED NOT DETECTED Final   Cyclospora cayetanensis NOT DETECTED NOT DETECTED Final   Entamoeba histolytica NOT DETECTED NOT DETECTED Final   Giardia lamblia NOT DETECTED NOT DETECTED Final   Adenovirus F40/41 NOT DETECTED NOT DETECTED Final   Astrovirus NOT DETECTED NOT DETECTED Final   Norovirus GI/GII NOT DETECTED NOT DETECTED Final   Rotavirus A NOT DETECTED NOT DETECTED Final   Sapovirus (I, II, IV, and V) NOT DETECTED NOT DETECTED Final    Comment: Performed at [facility], [address] [place], [place] [address]  C Difficile Quick Screen (NO PCR Reflex)     Status: None   Collection Time: 04/21/20  9:56 PM   Specimen: Stool  Result Value Ref Range Status   C Diff antigen NEGATIVE NEGATIVE Final   C Diff toxin NEGATIVE NEGATIVE Final   C Diff interpretation No C. difficile detected.  Final    Comment: Performed at [facility], [place] [address], [place], [place] [address]     Radiology Studies:  DG Abd Portable 1V  Result Date: 04/29/2020 CLINICAL DATA:  Abdominal distension. Patient was having diarrhea before change to constipation over the last 1-1/2 days. EXAM: PORTABLE ABDOMEN - 1 VIEW COMPARISON:  None. FINDINGS: Bilateral hip replacements partially visualized. No evidence of obstruction. No free air, portal venous gas, or pneumatosis seen on supine imaging. Mild opacity in left base, incompletely evaluated. IMPRESSION: 1. No acute intra-abdominal abnormalities. 2. Mild opacity in left base is incompletely evaluated but could represent atelectasis or pneumonia. Recommend clinical correlation. Electronically Signed   By: Dorise Bullion III M.D   On: 04/29/2020 11:56     Scheduled Meds:   . amLODipine  5 mg  Oral Daily  . budesonide  9 mg Oral Daily  . Gerhardt's butt cream   Topical QID  . mesalamine  4.8 g Oral Q breakfast  . mesalamine  4 g Rectal  QHS  . pantoprazole (PROTONIX) IV  40 mg Intravenous Q12H  . polyethylene glycol  17 g Oral Daily  . [START ON 05/01/2020] predniSONE  20 mg Oral Q breakfast    Continuous Infusions:      LOS: 9 days     Vernell Leep, MD, [place], [facility]. [facility]    To contact the attending provider between 7A-7P or the covering provider during after hours 7P-7A, please log into the web site [email or web address] and access using universal [place] password for that web site. If you do not have the password, please call the hospital operator.  04/30/2020, 3:01 PM

## 2020-04-30 NOTE — Progress Notes (Signed)
PT Cancellation Note  Patient Details Name: Crystal Ramsey MRN: [id number] DOB: 1935-10-28   Cancelled Treatment:    Reason Eval/Treat Not Completed: Patient declined, no reason specified. PT attempts to see pt for evaluation however pt declines. Pt reports she is weaker than normal but has been walking well around room with her cane. Pt reports she plans to use a walker upon her return home to improve her stability when ambulating and that she is really not far from her baseline. Pt refusing PT evaluation at this time. PT will complete order, please re-consult if the pt becomes agreeable to PT evaluation.    Zenaida Niece 04/30/2020, 4:27 PM

## 2020-05-01 LAB — BASIC METABOLIC PANEL
Anion gap: 10 (ref 5–15)
BUN: 15 mg/dL (ref 8–23)
CO2: 27 mmol/L (ref 22–32)
Calcium: 7.5 mg/dL — ABNORMAL LOW (ref 8.9–10.3)
Chloride: 95 mmol/L — ABNORMAL LOW (ref 98–111)
Creatinine, Ser: 0.71 mg/dL (ref 0.44–1.00)
GFR, Estimated: >60 mL/min (ref >60)
Glucose, Bld: 127 mg/dL — ABNORMAL HIGH (ref 70–99)
Potassium: 3.4 mmol/L — ABNORMAL LOW (ref 3.5–5.1)
Sodium: 132 mmol/L — ABNORMAL LOW (ref 135–145)

## 2020-05-01 MED ORDER — BUDESONIDE 3 MG PO CPEP: 9.0000 mg | ORAL_CAPSULE | Freq: Every day | ORAL | 0 refills | Status: DC

## 2020-05-01 MED ORDER — MESALAMINE 1.2 G PO TBEC: 4.8000 g | DELAYED_RELEASE_TABLET | Freq: Every day | ORAL | 0 refills | Status: AC

## 2020-05-01 MED ORDER — PREDNISONE 20 MG PO TABS: 20.0000 mg | ORAL_TABLET | Freq: Every day | ORAL | 0 refills | Status: DC

## 2020-05-01 MED ORDER — AMLODIPINE BESYLATE 5 MG PO TABS: 5.0000 mg | ORAL_TABLET | Freq: Every day | ORAL | 0 refills | Status: DC

## 2020-05-01 MED ORDER — POTASSIUM CHLORIDE CRYS ER 20 MEQ PO TBCR
40.0000 meq | EXTENDED_RELEASE_TABLET | Freq: Once | ORAL | Status: AC
  Administered 2020-05-01: 11:00:00 40 meq via ORAL
  Filled 2020-05-01: qty 2

## 2020-05-01 MED ORDER — POLYETHYLENE GLYCOL 3350 17 G PO PACK: 17.0000 g | PACK | Freq: Every day | ORAL | 0 refills | Status: AC

## 2020-05-01 MED ORDER — OMEPRAZOLE MAGNESIUM 20 MG PO TBEC: 20.0000 mg | DELAYED_RELEASE_TABLET | Freq: Every day | ORAL | 0 refills | Status: AC

## 2020-05-01 MED ORDER — MESALAMINE 1000 MG RE SUPP: 1000.0000 mg | Freq: Every day | RECTAL | 0 refills | Status: DC

## 2020-05-01 NOTE — Discharge Instructions (Addendum)

## 2020-05-01 NOTE — Discharge Summary (Signed)
Physician Discharge Summary  FLORIA BRANDAU OVZ:[id number] DOB: 1936/03/15  PCP: Lavone Orn, MD  Admitted from: Home Discharged to: Home  Admit date: 04/21/2020 Discharge date: 05/01/2020  Recommendations for Outpatient Follow-up:    Follow-up Information    Gastroenterology, [facility]. Schedule an appointment as soon as possible for a visit in 2 week(s).   Why: colitis  Contact information: [place] [address] [phone]        Lavone Orn, MD. Schedule an appointment as soon as possible for a visit in 1 week(s).   Specialty: Internal Medicine Why: To be seen with repeat labs (CBC & CMP). Contact information: [address], [address] [place] [address] [phone]                Home Health: None    Equipment/Devices: None    Discharge Condition: Improved and stable.   Code Status: Full Code Diet recommendation:  Discharge Diet Orders (From admission, onward)    Start     Ordered   05/01/20 0000  Diet - low sodium heart healthy       Comments: Diet consistency: Soft diet for several days and then gradually advance to regular consistency as tolerated   05/01/20 1122           Discharge Diagnoses:  Principal Problem:   GI bleed Active Problems:   Hypertension   OA (osteoarthritis) of hip   Hyponatremia   Hypokalemia   Colitis, acute   Bloody stool   Brief Summary: 84 year old female with history of DJD/chronic back pain, chronic constipation, GERD, developed diarrhea with blood specks about 10 to 12 days prior to admission while she was vacationing in [place].  She was seen in an ER there and decided to come back to [place].  Diarrhea briefly improved for couple of days and then worsened again 3 days prior to admission along with specks of bright red blood.  She was seen at [place] office and advised to come to the ED.  In the ED, hemoglobin around 10, serum sodium 124, mild hypokalemia, CT abdomen  and pelvis noted thickening of the sigmoid colon concerning for sigmoid colitis.  GI consulted, s/p flexible sigmoidoscopy and biopsy, consistent with ulcerative colitis, treating with parenteral and topical/enteral steroids.  Significant rectal pain since last evening, GI seen, treated for fecal impaction.  Improved.   Assessment & Plan:   Newly diagnosed severe left-sided/rectosigmoid ulcerative colitis:  Patient presented with painless hematochezia and diarrhea.  CT concerning for sigmoid colitis  C. difficile PCR and GI panel PCR negative.  Empirically started on IV Cipro and metronidazole-have since been discontinued several days back.  GI consulted, s/p flexible sigmoidoscopy with findings as noted below.  Initial concern for ischemic versus inflammatory colitis.  GI continued to follow.  Treating for distal ulcerative colitis.  As per GI follow-up,IV steroids being changed to oral prednisone 40 mg daily and tapered, continued budesonide, oral mesalamine and mesalamine enemas, metronidazole stopped.  KUB without acute abnormalities.  Course complicated by fecal impaction which resolved after a milk and molasses enema.  Her symptoms have not completely resolved but she is significantly better than on admission.  Now having BMs about every 2.5 hours, minimal volume, no tenesmus and mostly without bleeding except a speck of blood during this morning's BM.  Tolerating diet well.  No abdominal pain.  Patient expressed frustration of the prolonged hospitalization and insists on being discharged today.  I communicated with rounding  [facility] MD who have cleared her for discharge on the following IBD medications: Canasa suppositories 1000 mg PR nightly, Lialda 1.2 g p.o. daily, Entocort 9 mg p.o. daily, prednisone 20 mg p.o. daily, additional GI medications including MiraLAX 17 g p.o. daily and omeprazole 20 mg daily (her home medications, instead of recommended Protonix 40 daily).  They  feel that Rowasa enema that she was getting in the hospital is a bit redundant and cumbersome and hence recommended discontinuing this.  They also recommended that she follow-up with Dr. Michail Sermon within the next 2 to 4 weeks and he can then titrate or adjust medications as needed.  GI signed off.  I have provided with 1 month prescriptions and patient to be seen by her PCP or GI prior to that and will need to have refills if needed and will need to taper down prednisone.  Fecal impaction  Significant rectal pain since last evening, GI seen, treating for fecal impaction.  MiraLAX and milk of molasses enema.  Resolved after enema.  MiraLAX decreased to avoid diarrhea related to it.  Continue MiraLAX at discharge.  GERD  Continue home omeprazole at discharge  Dehydration with hyponatremia  Hyponatremia likely due to GI losses, poor oral intake and HCTZ.  Held home dose of Moduretic in the hospital.  Dehydration resolved with IV fluids.  Continue to hold Moduretic at discharge due to still not fully resolved diarrhea, risk of dehydration and further hyponatremia.  Follow BMP closely as outpatient.  Essential hypertension  Discontinued home dose of Moduretic due to dehydration, hyponatremia, ongoing GI losses in the hospital.  Initiated low-dose amlodipine 5 mg daily.  Mildly uncontrolled at times but would not increase dose just yet to avoid side effects.  Close outpatient follow-up and can consider increasing to 10 mg daily if needed.  Hypokalemia/hypomagnesemia  Replaced PTA.  Continue low-dose home oral potassium supplements as prior.  Close follow-up of BMP as outpatient.  Chronic pain/osteoarthritis  Supportive/symptomatic treatment with home regimen of hydrocodone.  Anemia of chronic disease  Hemoglobin stable in the 9 g range.  Follow CBC periodically as outpatient.  Body mass index is 30.42 kg/m.       Consultants:   [facility].  Procedures:    Flexible sigmoidoscopy 04/25/2020:  Impression:  - Preparation of the colon was fair. - Congested, erythematous, eroded, friable (with spontaneous bleeding), granular, hemorrhagic, inflamed, nodular, ulcerated, vascular-pattern-decreased and thickened folds of the mucosa in the rectum, in the recto-sigmoid colon and in the sigmoid colon. Biopsied. - Endoscopic findings concerning for ischemic colitis but ulcerative colitis also in differential.  Pathology report:  FINAL MICROSCOPIC DIAGNOSIS:   A. COLON, SIGMOID AND RECTUM, BIOPSY:  - Chronic severally active colitis.  - No dysplasia or malignancy.   COMMENT:   The features are consistent with inflammatory bowel disease in the  proper clinical setting. Ischemic changes are not seen.       Discharge Instructions  Discharge Instructions    Call MD for:   Complete by: As directed    Recurrent blood in the stools.   Call MD for:  difficulty breathing, headache or visual disturbances   Complete by: As directed    Call MD for:  extreme fatigue   Complete by: As directed    Call MD for:  persistant dizziness or light-headedness   Complete by: As directed    Call MD for:  persistant nausea and vomiting   Complete by: As directed    Call MD for:  severe uncontrolled pain   Complete by: As directed    Call MD for:  temperature >100.4   Complete by: As directed    Diet - low sodium heart healthy   Complete by: As directed    Diet consistency: Soft diet for several days and then gradually advance to regular consistency as tolerated   Increase activity slowly   Complete by: As directed        Medication List    STOP taking these medications   amiloride-hydrochlorothiazide 5-50 MG tablet Commonly known as: MODURETIC   aspirin 325 MG EC tablet   ciprofloxacin 250 MG tablet Commonly known as: CIPRO   methocarbamol 500 MG tablet Commonly known as: ROBAXIN   ondansetron 4 MG tablet Commonly known as: ZOFRAN    traMADol 50 MG tablet Commonly known as: ULTRAM     TAKE these medications   amLODipine 5 MG tablet Commonly known as: NORVASC Take 1 tablet (5 mg total) by mouth daily. Start taking on: May 02, 2020   budesonide 3 MG 24 hr capsule Commonly known as: ENTOCORT EC Take 3 capsules (9 mg total) by mouth daily. Start taking on: May 02, 2020   calcium carbonate 500 MG chewable tablet Commonly known as: TUMS - dosed in mg elemental calcium Chew 2 tablets by mouth 3 (three) times daily as needed for indigestion or heartburn.   clobetasol cream 0.05 % Commonly known as: TEMOVATE Apply 1 application topically 2 (two) times daily as needed. To external vulvar area for itching   HYDROcodone-acetaminophen 7.5-325 MG tablet Commonly known as: NORCO Take 1-2 tablets by mouth every 4 (four) hours as needed for severe pain.   mesalamine 1000 MG suppository Commonly known as: CANASA Place 1 suppository (1,000 mg total) rectally at bedtime.   mesalamine 1.2 g EC tablet Commonly known as: LIALDA Take 4 tablets (4.8 g total) by mouth daily with breakfast. Start taking on: May 02, 2020   multivitamin with minerals Tabs tablet Take 1 tablet by mouth daily.   omeprazole 20 MG tablet Commonly known as: PRILOSEC OTC Take 1 tablet (20 mg total) by mouth daily. What changed:   when to take this  reasons to take this   polyethylene glycol 17 g packet Commonly known as: MIRALAX / GLYCOLAX Take 17 g by mouth daily. Start taking on: May 02, 2020   potassium chloride 10 MEQ tablet Commonly known as: KLOR-CON Take 10 mEq by mouth 2 (two) times daily.   predniSONE 20 MG tablet Commonly known as: DELTASONE Take 1 tablet (20 mg total) by mouth daily with breakfast. Start taking on: May 02, 2020      Allergies  Allergen Reactions  . Other Other (See Comments)    CONTRAST DYE-unknown  . Demerol [Meperidine] Itching and Nausea And Vomiting  . Dilaudid  [Hydromorphone Hcl] Itching and Nausea And Vomiting  . Macrodantin [Nitrofurantoin] Other (See Comments)    unknown  . Sulfa Antibiotics Other (See Comments)    unknown  . Zithromax [Azithromycin] Other (See Comments)    flu  . Chlorhexidine Gluconate Rash  . Penicillins Other (See Comments)    Has patient had a PCN reaction causing immediate rash, facial/tongue/throat swelling, SOB or lightheadedness with hypotension: NO Has patient had a PCN reaction causing severe rash involving mucus membranes or skin necrosis: unknown Has patient had a PCN reaction that required hospitalization: unknown Has patient had a PCN reaction occurring within the last 10 years: No If all of the above answers  are "NO", then may proceed with Cephalosporin use.  Unknown  Think it was vomiting, NO anaphylaxis      Procedures/Studies: CT Abdomen Pelvis W Contrast  Result Date: 04/21/2020 CLINICAL DATA:  Abdominal pain, rectal bleeding, diarrhea for 3 weeks EXAM: CT ABDOMEN AND PELVIS WITH CONTRAST TECHNIQUE: Multidetector CT imaging of the abdomen and pelvis was performed using the standard protocol following bolus administration of intravenous contrast. CONTRAST:  168mL OMNIPAQUE IOHEXOL 300 MG/ML  SOLN COMPARISON:  None. FINDINGS: Lower chest: Moderate hiatal hernia with intrathoracic position of the gastric fundus. Scarring or atelectasis of the bilateral lung bases. Hepatobiliary: No solid liver abnormality is seen. Multiple small low and fluid attenuation liver lesions, likely small cysts and/or hemangiomata. No gallstones, gallbladder wall thickening, or biliary dilatation. Pancreas: Unremarkable. No pancreatic ductal dilatation or surrounding inflammatory changes. Spleen: Normal in size without significant abnormality. Adrenals/Urinary Tract: Adrenal glands are unremarkable. Kidneys are normal, without renal calculi, solid lesion, or hydronephrosis. Mildly distended urinary bladder. Stomach/Bowel: Stomach is  within normal limits. Appendix is not clearly visualized and may be surgically absent. Long segment wall thickening of the sigmoid colon (series 4, image 58). There does not appear to be significant underlying diverticular disease. Fluid in the rectum. Vascular/Lymphatic: Aortic atherosclerosis. No enlarged abdominal or pelvic lymph nodes. Reproductive: No mass or other significant abnormality. Other: No abdominal wall hernia or abnormality. No abdominopelvic ascites. Musculoskeletal: No acute or significant osseous findings. Status post bilateral hip total arthroplasty. IMPRESSION: 1. Long segment wall thickening of the sigmoid colon , consistent with nonspecific infectious, inflammatory, or ischemic colitis. There does not appear to be significant underlying diverticular disease. Findings are consistent with nonspecific infectious, inflammatory, or ischemic colitis, and without specific findings to localize nidus of GI bleeding. 2. Fluid in the rectum, consistent with diarrheal illness. 3. Moderate hiatal hernia with intrathoracic position of the gastric fundus. 4. Mildly distended urinary bladder. Correlate for urinary retention. Aortic Atherosclerosis (ICD10-I70.0). Electronically Signed   By: Eddie Candle M.D.   On: 04/21/2020 19:01   DG Abd Portable 1V  Result Date: 04/29/2020 CLINICAL DATA:  Abdominal distension. Patient was having diarrhea before change to constipation over the last 1-1/2 days. EXAM: PORTABLE ABDOMEN - 1 VIEW COMPARISON:  None. FINDINGS: Bilateral hip replacements partially visualized. No evidence of obstruction. No free air, portal venous gas, or pneumatosis seen on supine imaging. Mild opacity in left base, incompletely evaluated. IMPRESSION: 1. No acute intra-abdominal abnormalities. 2. Mild opacity in left base is incompletely evaluated but could represent atelectasis or pneumonia. Recommend clinical correlation. Electronically Signed   By: Dorise Bullion III M.D   On: 04/29/2020  11:56      Subjective: Patient expressed extreme frustration at her prolonged hospitalization.  Insists on being discharged today no matter what.  Overall feels better.  Still has diarrhea about every 2.5 hours but extremely low volume, some urgency, no rectal pain or abdominal pain.  Noticed a speck of blood during last stooling. Eating well.  Denies nausea or vomiting.  No weakness, dizziness or lightheadedness reported today.  Declined PT evaluation yesterday.  As per RN, no acute issues reported.  Discharge Exam:  Vitals:   04/30/20 0439 04/30/20 1404 04/30/20 1946 05/01/20 0337  BP: (!) 167/74 (!) 163/80 (!) 158/90 (!) 157/89  Pulse: 87 99 (!) 108 100  Resp: 20 18  18   Temp: 98.2 F (36.8 C) 98 F (36.7 C) 98.9 F (37.2 C) 98.5 F (36.9 C)  TempSrc: Oral Oral  Oral  SpO2: 95% 95% 97% 97%  Weight:      Height:        General exam: Elderly female, moderately built and overweight, lying comfortably in bed in left lateral position.  Oral mucosa moist Respiratory system: Clear to auscultation.  No increased work of breathing. Cardiovascular system: S1 & S2 heard, RRR. No JVD, murmurs, rubs, gallops or clicks. No pedal edema. Gastrointestinal system:  Abdomen is nondistended, soft and nontender.  No organomegaly or masses appreciated.  Normal bowel sounds heard. Central nervous system: Alert and oriented. No focal neurological deficits.  Extremely hard of hearing. Extremities: Symmetric 5 x 5 power. Skin: No rashes, lesions or ulcers Psychiatry: Judgement and insight appear normal. Mood & affect irritable today.    The results of significant diagnostics from this hospitalization (including imaging, microbiology, ancillary and laboratory) are listed below for reference.     Microbiology: Recent Results (from the past 240 hour(s))  Resp Panel by RT-PCR (Flu A&B, Covid) Nasopharyngeal Swab     Status: None   Collection Time: 04/21/20  5:19 PM   Specimen: Nasopharyngeal Swab;  Nasopharyngeal(NP) swabs in vial transport medium  Result Value Ref Range Status   SARS Coronavirus 2 by RT PCR NEGATIVE NEGATIVE Final    Comment: (NOTE) SARS-CoV-2 target nucleic acids are NOT DETECTED.  The SARS-CoV-2 RNA is generally detectable in upper respiratory specimens during the acute phase of infection. The lowest concentration of SARS-CoV-2 viral copies this assay can detect is 138 copies/mL. A negative result does not preclude SARS-Cov-2 infection and should not be used as the sole basis for treatment or other patient management decisions. A negative result may occur with  improper specimen collection/handling, submission of specimen other than nasopharyngeal swab, presence of viral mutation(s) within the areas targeted by this assay, and inadequate number of viral copies(<138 copies/mL). A negative result must be combined with clinical observations, patient history, and epidemiological information. The expected result is Negative.  Fact Sheet for Patients:  EntrepreneurPulse.com.au  Fact Sheet for Healthcare Providers:  IncredibleEmployment.be  This test is no t yet approved or cleared by the [place] FDA and  has been authorized for detection and/or diagnosis of SARS-CoV-2 by FDA under an Emergency Use Authorization (EUA). This EUA will remain  in effect (meaning this test can be used) for the duration of the COVID-19 declaration under Section 564(b)(1) of the Act, 21 U.S.C.section 360bbb-3(b)(1), unless the authorization is terminated  or revoked sooner.       Influenza A by PCR NEGATIVE NEGATIVE Final   Influenza B by PCR NEGATIVE NEGATIVE Final    Comment: (NOTE) The Xpert Xpress SARS-CoV-2/FLU/RSV plus assay is intended as an aid in the diagnosis of influenza from Nasopharyngeal swab specimens and should not be used as a sole basis for treatment. Nasal washings and aspirates are unacceptable for Xpert Xpress  SARS-CoV-2/FLU/RSV testing.  Fact Sheet for Patients: EntrepreneurPulse.com.au  Fact Sheet for Healthcare Providers: IncredibleEmployment.be  This test is not yet approved or cleared by the [place] FDA and has been authorized for detection and/or diagnosis of SARS-CoV-2 by FDA under an Emergency Use Authorization (EUA). This EUA will remain in effect (meaning this test can be used) for the duration of the COVID-19 declaration under Section 564(b)(1) of the Act, 21 U.S.C. section 360bbb-3(b)(1), unless the authorization is terminated or revoked.  Performed at [facility], [place] [address] [place], [place] [address]   Gastrointestinal Panel by PCR , Stool     Status: None  Collection Time: 04/21/20  9:56 PM   Specimen: Stool  Result Value Ref Range Status   Campylobacter species NOT DETECTED NOT DETECTED Final   Plesimonas shigelloides NOT DETECTED NOT DETECTED Final   Salmonella species NOT DETECTED NOT DETECTED Final   Yersinia enterocolitica NOT DETECTED NOT DETECTED Final   Vibrio species NOT DETECTED NOT DETECTED Final   Vibrio cholerae NOT DETECTED NOT DETECTED Final   Enteroaggregative E coli (EAEC) NOT DETECTED NOT DETECTED Final   Enteropathogenic E coli (EPEC) NOT DETECTED NOT DETECTED Final   Enterotoxigenic E coli (ETEC) NOT DETECTED NOT DETECTED Final   Shiga like toxin producing E coli (STEC) NOT DETECTED NOT DETECTED Final   Shigella/Enteroinvasive E coli (EIEC) NOT DETECTED NOT DETECTED Final   Cryptosporidium NOT DETECTED NOT DETECTED Final   Cyclospora cayetanensis NOT DETECTED NOT DETECTED Final   Entamoeba histolytica NOT DETECTED NOT DETECTED Final   Giardia lamblia NOT DETECTED NOT DETECTED Final   Adenovirus F40/41 NOT DETECTED NOT DETECTED Final   Astrovirus NOT DETECTED NOT DETECTED Final   Norovirus GI/GII NOT DETECTED NOT DETECTED Final   Rotavirus A NOT DETECTED NOT DETECTED Final   Sapovirus (I, II,  IV, and V) NOT DETECTED NOT DETECTED Final    Comment: Performed at [facility], [address] [place], [place] [address]  C Difficile Quick Screen (NO PCR Reflex)     Status: None   Collection Time: 04/21/20  9:56 PM   Specimen: Stool  Result Value Ref Range Status   C Diff antigen NEGATIVE NEGATIVE Final   C Diff toxin NEGATIVE NEGATIVE Final   C Diff interpretation No C. difficile detected.  Final    Comment: Performed at [facility], [place] [address] [place], [place] [address]     Labs: CBC: Recent Labs  Lab 04/25/20 0143 04/26/20 0222 04/27/20 0235 04/28/20 0031 04/30/20 0149  WBC 8.0 8.0 9.9 9.0 5.5  HGB 9.5* 9.2* 10.6* 9.9* 9.7*  HCT 27.1* 26.3* 30.8* 29.3* 27.8*  MCV 90.3 91.0 90.1 90.2 89.7  PLT 421* 418* 457* 502* 427*    Basic Metabolic Panel: Recent Labs  Lab 04/27/20 0235 04/28/20 0031 04/29/20 0456 04/30/20 0149 05/01/20 0019  NA 130* 132* 135 134* 132*  K 3.5 3.7 3.2* 3.7 3.4*  CL 97* 102 101 99 95*  CO2 23 23 26 24 27   GLUCOSE 157* 153* 119* 108* 127*  BUN 11 17 18 11 15   CREATININE 0.87 0.83 0.69 0.72 0.71  CALCIUM 8.0* 7.9* 7.9* 7.7* 7.5*  MG  --   --  1.4* 2.1  --    The course of the hospitalization, patient declined MDs offer to discuss her care with her family.  Time coordinating discharge: 40 minutes  SIGNED:  Vernell Leep, MD, [place], [facility]. [facility]  To contact the attending provider between 7A-7P or the covering provider during after hours 7P-7A, please log into the web site [email or web address] and access using universal [place] password for that web site. If you do not have the password, please call the hospital operator.

## 2020-05-01 NOTE — Plan of Care (Signed)

## 2020-05-01 NOTE — Progress Notes (Signed)
Per hospitalist team, patient would like to go home today.  Advise IBD discharge medications as such:  1.  Canasa suppositories 1000 mg PR QHS  2.  Lialda 1.2 gram tablets, take 4 tabs (4.8 g) po qd.  3.  Budesonide (Entocort) 9 mg po qd.  4.  Prednisone 20 mg po qd.  Other GI medications:    1.  Continue Miralax 17 grams po qd.  2.  Continue pantoprazole 40 mg po qd.  I feel Rowasa enemas are a bit redundant and cumbersome, and would stop this.  Patient should have follow-up with Dr. Michail Sermon within the next 2-4 weeks.  Dr. Michail Sermon can then titrate/adjust medications as needed at that point.  [facility] will sign-off; please call with further questions/concerns.

## 2020-05-16 DIAGNOSIS — I10 Essential (primary) hypertension: Secondary | ICD-10-CM | POA: Diagnosis not present

## 2020-05-16 DIAGNOSIS — R197 Diarrhea, unspecified: Secondary | ICD-10-CM | POA: Diagnosis not present

## 2020-05-16 DIAGNOSIS — K51511 Left sided colitis with rectal bleeding: Secondary | ICD-10-CM | POA: Diagnosis not present

## 2020-06-01 DIAGNOSIS — E876 Hypokalemia: Secondary | ICD-10-CM | POA: Diagnosis not present

## 2020-06-06 DIAGNOSIS — D649 Anemia, unspecified: Secondary | ICD-10-CM | POA: Diagnosis not present

## 2020-06-06 DIAGNOSIS — R000 Tachycardia, unspecified: Secondary | ICD-10-CM | POA: Diagnosis not present

## 2020-06-06 DIAGNOSIS — H9193 Unspecified hearing loss, bilateral: Secondary | ICD-10-CM | POA: Diagnosis not present

## 2020-06-06 DIAGNOSIS — K51511 Left sided colitis with rectal bleeding: Secondary | ICD-10-CM | POA: Diagnosis not present

## 2020-06-06 DIAGNOSIS — R609 Edema, unspecified: Secondary | ICD-10-CM | POA: Diagnosis not present

## 2020-06-06 DIAGNOSIS — R0600 Dyspnea, unspecified: Secondary | ICD-10-CM | POA: Diagnosis not present

## 2020-06-16 DIAGNOSIS — D509 Iron deficiency anemia, unspecified: Secondary | ICD-10-CM | POA: Diagnosis not present

## 2020-06-16 DIAGNOSIS — E876 Hypokalemia: Secondary | ICD-10-CM | POA: Diagnosis not present

## 2020-06-22 DIAGNOSIS — M150 Primary generalized (osteo)arthritis: Secondary | ICD-10-CM | POA: Diagnosis not present

## 2020-06-22 DIAGNOSIS — Z79891 Long term (current) use of opiate analgesic: Secondary | ICD-10-CM | POA: Diagnosis not present

## 2020-06-22 DIAGNOSIS — M47816 Spondylosis without myelopathy or radiculopathy, lumbar region: Secondary | ICD-10-CM | POA: Diagnosis not present

## 2020-06-22 DIAGNOSIS — G894 Chronic pain syndrome: Secondary | ICD-10-CM | POA: Diagnosis not present

## 2020-07-04 DIAGNOSIS — E441 Mild protein-calorie malnutrition: Secondary | ICD-10-CM | POA: Diagnosis not present

## 2020-07-04 DIAGNOSIS — I10 Essential (primary) hypertension: Secondary | ICD-10-CM | POA: Diagnosis not present

## 2020-07-04 DIAGNOSIS — K51511 Left sided colitis with rectal bleeding: Secondary | ICD-10-CM | POA: Diagnosis not present

## 2020-07-04 DIAGNOSIS — D638 Anemia in other chronic diseases classified elsewhere: Secondary | ICD-10-CM | POA: Diagnosis not present

## 2020-07-17 DIAGNOSIS — H838X3 Other specified diseases of inner ear, bilateral: Secondary | ICD-10-CM | POA: Diagnosis not present

## 2020-07-17 DIAGNOSIS — H6983 Other specified disorders of Eustachian tube, bilateral: Secondary | ICD-10-CM | POA: Diagnosis not present

## 2020-07-17 DIAGNOSIS — H903 Sensorineural hearing loss, bilateral: Secondary | ICD-10-CM | POA: Diagnosis not present

## 2020-07-25 DIAGNOSIS — K5150 Left sided colitis without complications: Secondary | ICD-10-CM | POA: Diagnosis not present

## 2020-08-17 DIAGNOSIS — M150 Primary generalized (osteo)arthritis: Secondary | ICD-10-CM | POA: Diagnosis not present

## 2020-08-17 DIAGNOSIS — Z79891 Long term (current) use of opiate analgesic: Secondary | ICD-10-CM | POA: Diagnosis not present

## 2020-08-17 DIAGNOSIS — M47816 Spondylosis without myelopathy or radiculopathy, lumbar region: Secondary | ICD-10-CM | POA: Diagnosis not present

## 2020-08-17 DIAGNOSIS — G894 Chronic pain syndrome: Secondary | ICD-10-CM | POA: Diagnosis not present

## 2020-08-23 DIAGNOSIS — Z96652 Presence of left artificial knee joint: Secondary | ICD-10-CM | POA: Diagnosis not present

## 2020-10-03 DIAGNOSIS — K5150 Left sided colitis without complications: Secondary | ICD-10-CM | POA: Diagnosis not present

## 2020-10-16 DIAGNOSIS — E871 Hypo-osmolality and hyponatremia: Secondary | ICD-10-CM | POA: Diagnosis not present

## 2020-10-25 DIAGNOSIS — G894 Chronic pain syndrome: Secondary | ICD-10-CM | POA: Diagnosis not present

## 2020-10-25 DIAGNOSIS — M150 Primary generalized (osteo)arthritis: Secondary | ICD-10-CM | POA: Diagnosis not present

## 2020-10-25 DIAGNOSIS — M47816 Spondylosis without myelopathy or radiculopathy, lumbar region: Secondary | ICD-10-CM | POA: Diagnosis not present

## 2020-10-25 DIAGNOSIS — Z79891 Long term (current) use of opiate analgesic: Secondary | ICD-10-CM | POA: Diagnosis not present

## 2020-10-31 DIAGNOSIS — I10 Essential (primary) hypertension: Secondary | ICD-10-CM | POA: Diagnosis not present

## 2020-10-31 DIAGNOSIS — Z1389 Encounter for screening for other disorder: Secondary | ICD-10-CM | POA: Diagnosis not present

## 2020-10-31 DIAGNOSIS — M5136 Other intervertebral disc degeneration, lumbar region: Secondary | ICD-10-CM | POA: Diagnosis not present

## 2020-10-31 DIAGNOSIS — Z0000 Encounter for general adult medical examination without abnormal findings: Secondary | ICD-10-CM | POA: Diagnosis not present

## 2020-10-31 DIAGNOSIS — K51511 Left sided colitis with rectal bleeding: Secondary | ICD-10-CM | POA: Diagnosis not present

## 2020-10-31 DIAGNOSIS — K219 Gastro-esophageal reflux disease without esophagitis: Secondary | ICD-10-CM | POA: Diagnosis not present

## 2020-10-31 DIAGNOSIS — M4696 Unspecified inflammatory spondylopathy, lumbar region: Secondary | ICD-10-CM | POA: Diagnosis not present

## 2020-10-31 DIAGNOSIS — Z862 Personal history of diseases of the blood and blood-forming organs and certain disorders involving the immune mechanism: Secondary | ICD-10-CM | POA: Diagnosis not present

## 2020-11-09 DIAGNOSIS — L570 Actinic keratosis: Secondary | ICD-10-CM | POA: Diagnosis not present

## 2020-11-09 DIAGNOSIS — D2262 Melanocytic nevi of left upper limb, including shoulder: Secondary | ICD-10-CM | POA: Diagnosis not present

## 2020-11-09 DIAGNOSIS — D225 Melanocytic nevi of trunk: Secondary | ICD-10-CM | POA: Diagnosis not present

## 2020-11-09 DIAGNOSIS — L821 Other seborrheic keratosis: Secondary | ICD-10-CM | POA: Diagnosis not present

## 2020-11-09 DIAGNOSIS — L720 Epidermal cyst: Secondary | ICD-10-CM | POA: Diagnosis not present

## 2020-11-09 DIAGNOSIS — D1801 Hemangioma of skin and subcutaneous tissue: Secondary | ICD-10-CM | POA: Diagnosis not present

## 2020-11-09 DIAGNOSIS — D2261 Melanocytic nevi of right upper limb, including shoulder: Secondary | ICD-10-CM | POA: Diagnosis not present

## 2020-11-22 DIAGNOSIS — K5150 Left sided colitis without complications: Secondary | ICD-10-CM | POA: Diagnosis not present

## 2021-01-03 DIAGNOSIS — Z79891 Long term (current) use of opiate analgesic: Secondary | ICD-10-CM | POA: Diagnosis not present

## 2021-01-03 DIAGNOSIS — G894 Chronic pain syndrome: Secondary | ICD-10-CM | POA: Diagnosis not present

## 2021-01-03 DIAGNOSIS — M150 Primary generalized (osteo)arthritis: Secondary | ICD-10-CM | POA: Diagnosis not present

## 2021-01-03 DIAGNOSIS — M47816 Spondylosis without myelopathy or radiculopathy, lumbar region: Secondary | ICD-10-CM | POA: Diagnosis not present

## 2021-02-26 DIAGNOSIS — Z23 Encounter for immunization: Secondary | ICD-10-CM | POA: Diagnosis not present

## 2021-03-14 DIAGNOSIS — M47816 Spondylosis without myelopathy or radiculopathy, lumbar region: Secondary | ICD-10-CM | POA: Diagnosis not present

## 2021-03-14 DIAGNOSIS — G894 Chronic pain syndrome: Secondary | ICD-10-CM | POA: Diagnosis not present

## 2021-03-14 DIAGNOSIS — Z79891 Long term (current) use of opiate analgesic: Secondary | ICD-10-CM | POA: Diagnosis not present

## 2021-03-14 DIAGNOSIS — M150 Primary generalized (osteo)arthritis: Secondary | ICD-10-CM | POA: Diagnosis not present

## 2021-03-16 DIAGNOSIS — Z23 Encounter for immunization: Secondary | ICD-10-CM | POA: Diagnosis not present

## 2021-03-28 DIAGNOSIS — H52203 Unspecified astigmatism, bilateral: Secondary | ICD-10-CM | POA: Diagnosis not present

## 2021-03-28 DIAGNOSIS — Z961 Presence of intraocular lens: Secondary | ICD-10-CM | POA: Diagnosis not present

## 2021-04-19 DIAGNOSIS — I10 Essential (primary) hypertension: Secondary | ICD-10-CM | POA: Diagnosis not present

## 2021-04-19 DIAGNOSIS — K219 Gastro-esophageal reflux disease without esophagitis: Secondary | ICD-10-CM | POA: Diagnosis not present

## 2021-04-19 DIAGNOSIS — K51511 Left sided colitis with rectal bleeding: Secondary | ICD-10-CM | POA: Diagnosis not present

## 2021-05-21 DIAGNOSIS — Z1231 Encounter for screening mammogram for malignant neoplasm of breast: Secondary | ICD-10-CM | POA: Diagnosis not present

## 2021-05-23 ENCOUNTER — Encounter: Payer: Self-pay | Admitting: Obstetrics & Gynecology

## 2021-05-23 DIAGNOSIS — M150 Primary generalized (osteo)arthritis: Secondary | ICD-10-CM | POA: Diagnosis not present

## 2021-05-23 DIAGNOSIS — G894 Chronic pain syndrome: Secondary | ICD-10-CM | POA: Diagnosis not present

## 2021-05-23 DIAGNOSIS — Z79891 Long term (current) use of opiate analgesic: Secondary | ICD-10-CM | POA: Diagnosis not present

## 2021-05-23 DIAGNOSIS — M47816 Spondylosis without myelopathy or radiculopathy, lumbar region: Secondary | ICD-10-CM | POA: Diagnosis not present

## 2021-06-27 DIAGNOSIS — K5150 Left sided colitis without complications: Secondary | ICD-10-CM | POA: Diagnosis not present

## 2021-06-27 DIAGNOSIS — K51319 Ulcerative (chronic) rectosigmoiditis with unspecified complications: Secondary | ICD-10-CM | POA: Diagnosis not present

## 2021-07-05 ENCOUNTER — Other Ambulatory Visit: Payer: Self-pay

## 2021-07-05 ENCOUNTER — Ambulatory Visit (INDEPENDENT_AMBULATORY_CARE_PROVIDER_SITE_OTHER): Payer: Medicare Other | Admitting: Obstetrics & Gynecology

## 2021-07-05 ENCOUNTER — Encounter: Payer: Self-pay | Admitting: Obstetrics & Gynecology

## 2021-07-05 VITALS — BP 134/82 | HR 76 | Resp 16 | Ht 61.0 in | Wt 154.0 lb

## 2021-07-05 DIAGNOSIS — Z01419 Encounter for gynecological examination (general) (routine) without abnormal findings: Secondary | ICD-10-CM | POA: Diagnosis not present

## 2021-07-05 DIAGNOSIS — Z90710 Acquired absence of both cervix and uterus: Secondary | ICD-10-CM

## 2021-07-05 DIAGNOSIS — Z87440 Personal history of urinary (tract) infections: Secondary | ICD-10-CM | POA: Diagnosis not present

## 2021-07-05 DIAGNOSIS — Z780 Asymptomatic menopausal state: Secondary | ICD-10-CM

## 2021-07-05 DIAGNOSIS — Z9189 Other specified personal risk factors, not elsewhere classified: Secondary | ICD-10-CM | POA: Diagnosis not present

## 2021-07-05 MED ORDER — CIPROFLOXACIN HCL 250 MG PO TABS: 250.0000 mg | ORAL_TABLET | Freq: Two times a day (BID) | ORAL | 2 refills | Status: AC

## 2021-07-05 NOTE — Progress Notes (Signed)
Crystal Ramsey 05/06/1936 [id number]   History:    86 y.o. G2P1A1L1  RP:  Established patient presenting for annual gyn exam   HPI:  Postmenopausal. Prior TVH and unilateral Oophorectomy.  No pelvic pain.  Abstinent. Pap Neg 03/2020.  History of DES use.  No significant history of abnormal Pap smears.  Breasts normal.  Mammogram 05/2021.  Colonoscopy at age 62.  Dx of Ulcerative Colitis last year, under control.  DEXA Normal in 2015.  History of occasional UTIs and she likes to keep a prescription for ciprofloxacin in the event she has one. BMI 29.1.  Health labs with Fam MD.  Past medical history,surgical history, family history and social history were all reviewed and documented in the EPIC chart.  Gynecologic History Postmenopause  Obstetric History OB History  Gravida Para Term Preterm AB Living  2 1 1   1 1   SAB IAB Ectopic Multiple Live Births               # Outcome Date GA Lbr Len/2nd Weight Sex Delivery Anes PTL Lv  2 AB           1 Term              ROS: A ROS was performed and pertinent positives and negatives are included in the history.  GENERAL: No fevers or chills. HEENT: No change in vision, no earache, sore throat or sinus congestion. NECK: No pain or stiffness. CARDIOVASCULAR: No chest pain or pressure. No palpitations. PULMONARY: No shortness of breath, cough or wheeze. GASTROINTESTINAL: No abdominal pain, nausea, vomiting or diarrhea, melena or bright red blood per rectum. GENITOURINARY: No urinary frequency, urgency, hesitancy or dysuria. MUSCULOSKELETAL: No joint or muscle pain, no back pain, no recent trauma. DERMATOLOGIC: No rash, no itching, no lesions. ENDOCRINE: No polyuria, polydipsia, no heat or cold intolerance. No recent change in weight. HEMATOLOGICAL: No anemia or easy bruising or bleeding. NEUROLOGIC: No headache, seizures, numbness, tingling or weakness. PSYCHIATRIC: No depression, no loss of interest in normal activity or change in sleep pattern.      Exam:   BP 134/82    Pulse 76    Resp 16    Ht 5\' 1"  (1.549 m)    Wt 154 lb (69.9 kg)    BMI 29.10 kg/m   Body mass index is 29.1 kg/m.  General appearance : Well developed well nourished female. No acute distress HEENT: Eyes: no retinal hemorrhage or exudates,  Neck supple, trachea midline, no carotid bruits, no thyroidmegaly Lungs: Clear to auscultation, no rhonchi or wheezes, or rib retractions  Heart: Regular rate and rhythm, no murmurs or gallops Breast:Examined in sitting and supine position were symmetrical in appearance, no palpable masses or tenderness,  no skin retraction, no nipple inversion, no nipple discharge, no skin discoloration, no axillary or supraclavicular lymphadenopathy Abdomen: no palpable masses or tenderness, no rebound or guarding Extremities: no edema or skin discoloration or tenderness  Pelvic: Vulva: Normal             Vagina: No gross lesions or discharge  Cervix/Uterus absent  Adnexa  Without masses or tenderness  Anus: Normal   Assessment/Plan:  86 y.o. female for annual exam   1. Encounter for gynecological examination without abnormal finding Postmenopausal. Prior TVH and unilateral Oophorectomy.  No pelvic pain.  Abstinent. Pap Neg 03/2020.  History of DES use.  No significant history of abnormal Pap smears.  Breasts normal.  Mammogram 05/2021.  Colonoscopy at age  5.  Dx of Ulcerative Colitis last year, under control.  DEXA Normal in 2015.  History of occasional UTIs and she likes to keep a prescription for ciprofloxacin in the event she has one. BMI 29.1.  Health labs with Fam MD.  2. DES exposure in utero  3. Status post total hysterectomy with unilateral Oophorectomy  4. Postmenopause Postmenopausal. Prior TVH and unilateral Oophorectomy.  No pelvic pain.  Abstinent.  BD normal in 2015.  5. Hx: UTI (urinary tract infection) Cipro prescription sent to pharmacy for occasional UTIs.  Other orders - diphenhydrAMINE HCl (BENADRYL PO) -  Ferrous Sulfate (IRON PO); Take by mouth. - amiloride-hydrochlorothiazide (MODURETIC) 5-50 MG tablet; Take 1 tablet by mouth daily. - ciprofloxacin (CIPRO) 250 MG tablet; Take 1 tablet (250 mg total) by mouth 2 (two) times daily for 7 days.   Princess Bruins MD, 2:43 PM 07/05/2021

## 2021-07-09 ENCOUNTER — Other Ambulatory Visit: Payer: Self-pay | Admitting: Obstetrics & Gynecology

## 2021-07-09 NOTE — Telephone Encounter (Signed)
Per Dr. Delilah Shan, Hx of chronic intermittent vulvitis.  AEX-07/05/21

## 2021-08-01 DIAGNOSIS — Z79891 Long term (current) use of opiate analgesic: Secondary | ICD-10-CM | POA: Diagnosis not present

## 2021-08-01 DIAGNOSIS — M47816 Spondylosis without myelopathy or radiculopathy, lumbar region: Secondary | ICD-10-CM | POA: Diagnosis not present

## 2021-08-01 DIAGNOSIS — M150 Primary generalized (osteo)arthritis: Secondary | ICD-10-CM | POA: Diagnosis not present

## 2021-08-01 DIAGNOSIS — G894 Chronic pain syndrome: Secondary | ICD-10-CM | POA: Diagnosis not present

## 2021-10-17 IMAGING — DX DG ABD PORTABLE 1V
1 series · 2 of 2 positions shown · non-contrast
Comparison: None.

CLINICAL DATA: Abdominal distension. Patient was having diarrhea
before change to constipation over the last 1-1/2 days.

EXAM:
PORTABLE ABDOMEN - 1 VIEW

[Series 1: abdomen · 0.14mm/px · 2 of 2 slices shown]
[im 1/2]
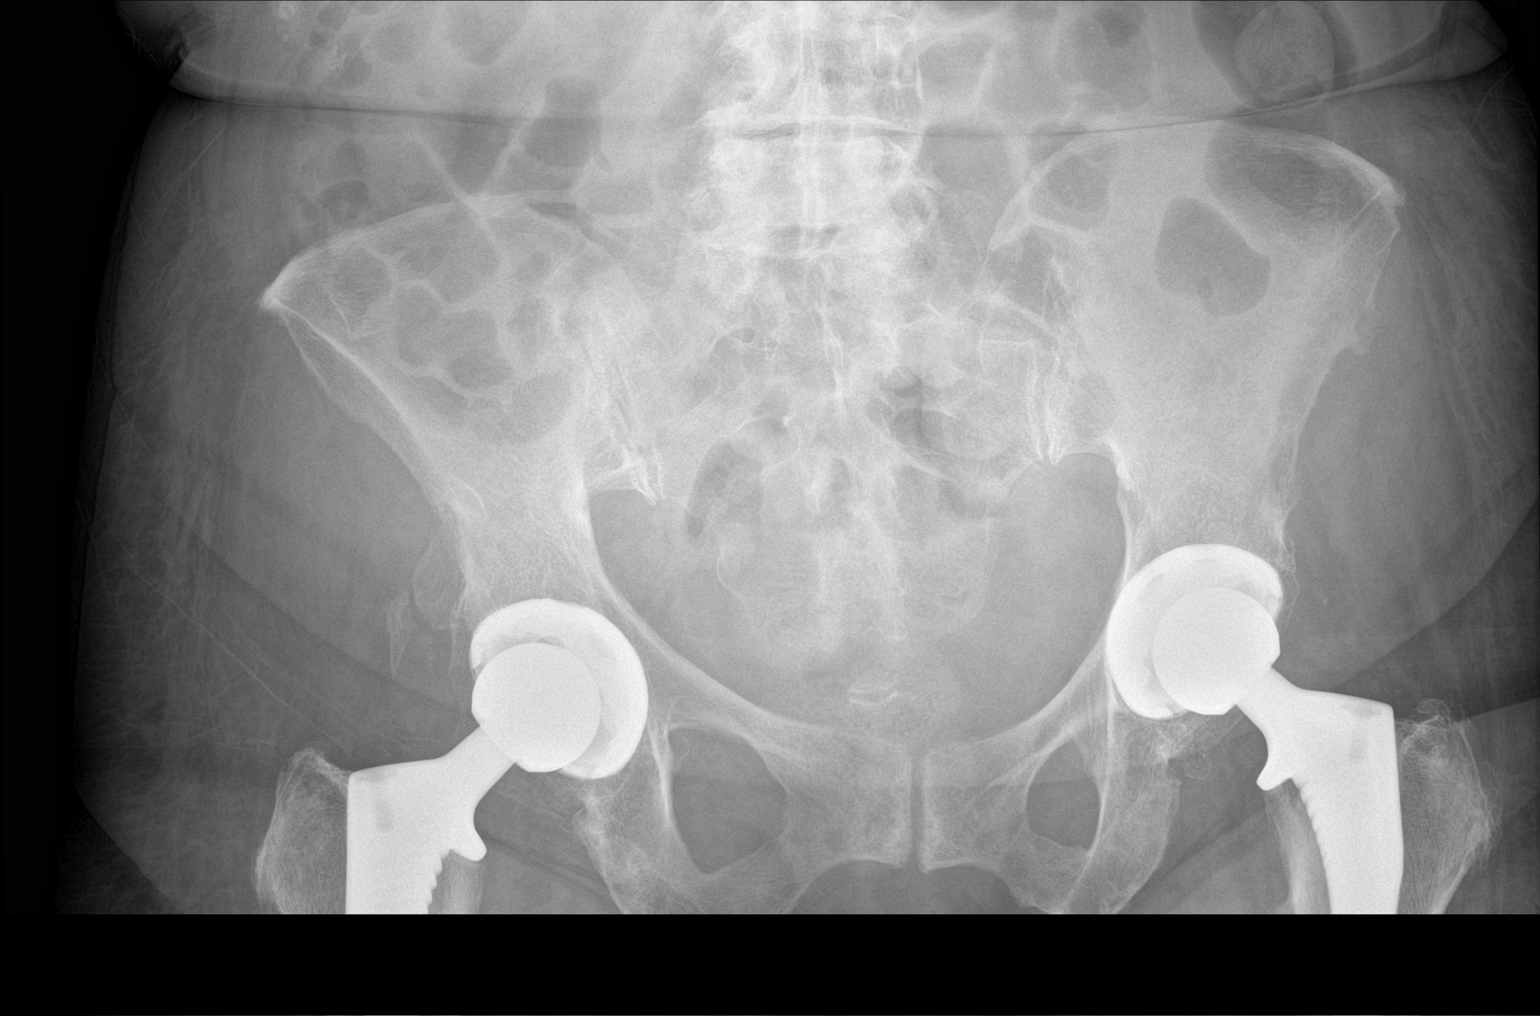
[im 2/2]
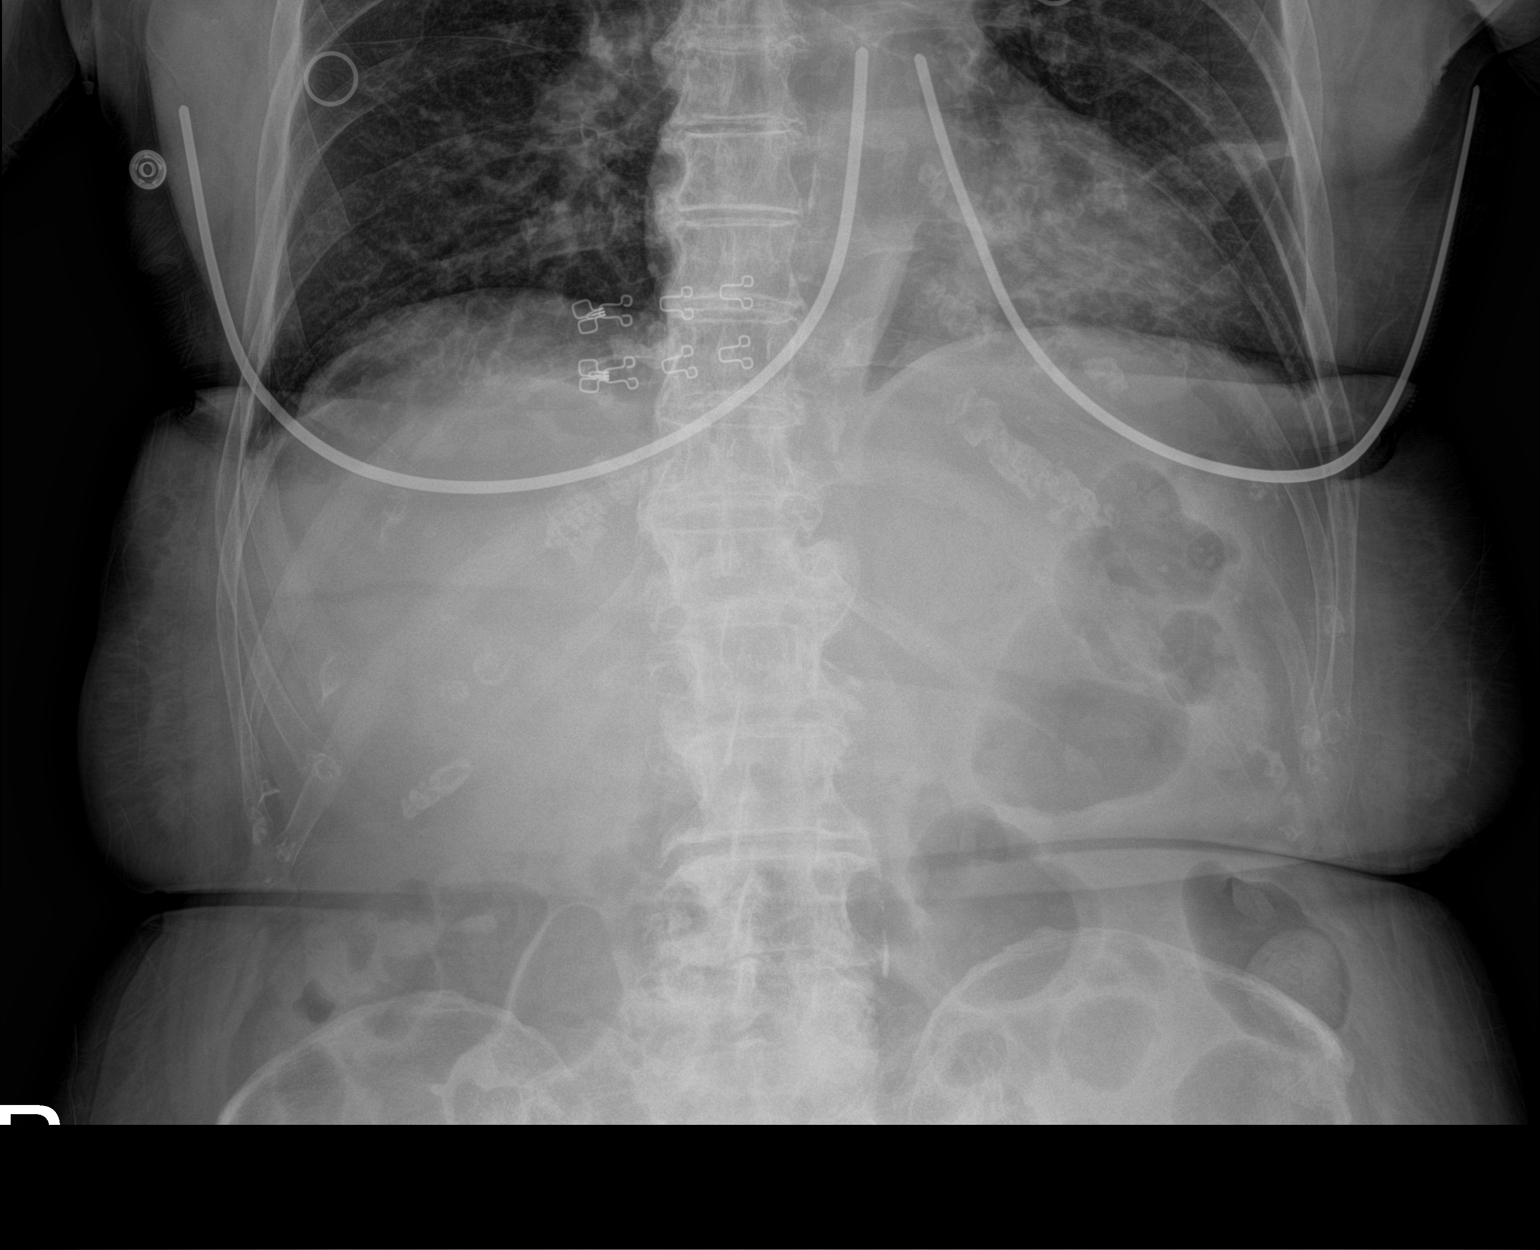

[2 of 2 positions shown; findings below may reference images not displayed]

FINDINGS: Bilateral hip replacements partially visualized. No evidence of
obstruction. No free air, portal venous gas, or pneumatosis seen on
supine imaging. Mild opacity in left base, incompletely evaluated.
IMPRESSION: 1. No acute intra-abdominal abnormalities.
2. Mild opacity in left base is incompletely evaluated but could
represent atelectasis or pneumonia. Recommend clinical correlation.

## 2021-10-22 DIAGNOSIS — G894 Chronic pain syndrome: Secondary | ICD-10-CM | POA: Diagnosis not present

## 2021-10-22 DIAGNOSIS — M150 Primary generalized (osteo)arthritis: Secondary | ICD-10-CM | POA: Diagnosis not present

## 2021-10-22 DIAGNOSIS — M47816 Spondylosis without myelopathy or radiculopathy, lumbar region: Secondary | ICD-10-CM | POA: Diagnosis not present

## 2021-10-22 DIAGNOSIS — Z79891 Long term (current) use of opiate analgesic: Secondary | ICD-10-CM | POA: Diagnosis not present

## 2021-12-18 DIAGNOSIS — K5150 Left sided colitis without complications: Secondary | ICD-10-CM | POA: Diagnosis not present

## 2021-12-18 DIAGNOSIS — M4696 Unspecified inflammatory spondylopathy, lumbar region: Secondary | ICD-10-CM | POA: Diagnosis not present

## 2021-12-18 DIAGNOSIS — I10 Essential (primary) hypertension: Secondary | ICD-10-CM | POA: Diagnosis not present

## 2021-12-18 DIAGNOSIS — M5136 Other intervertebral disc degeneration, lumbar region: Secondary | ICD-10-CM | POA: Diagnosis not present

## 2021-12-18 DIAGNOSIS — D509 Iron deficiency anemia, unspecified: Secondary | ICD-10-CM | POA: Diagnosis not present

## 2021-12-18 DIAGNOSIS — Z0000 Encounter for general adult medical examination without abnormal findings: Secondary | ICD-10-CM | POA: Diagnosis not present

## 2021-12-18 DIAGNOSIS — Z1331 Encounter for screening for depression: Secondary | ICD-10-CM | POA: Diagnosis not present

## 2021-12-18 DIAGNOSIS — K219 Gastro-esophageal reflux disease without esophagitis: Secondary | ICD-10-CM | POA: Diagnosis not present

## 2022-01-01 DIAGNOSIS — L821 Other seborrheic keratosis: Secondary | ICD-10-CM | POA: Diagnosis not present

## 2022-01-01 DIAGNOSIS — L661 Lichen planopilaris: Secondary | ICD-10-CM | POA: Diagnosis not present

## 2022-01-01 DIAGNOSIS — D1801 Hemangioma of skin and subcutaneous tissue: Secondary | ICD-10-CM | POA: Diagnosis not present

## 2022-01-01 DIAGNOSIS — L738 Other specified follicular disorders: Secondary | ICD-10-CM | POA: Diagnosis not present

## 2022-01-01 DIAGNOSIS — D225 Melanocytic nevi of trunk: Secondary | ICD-10-CM | POA: Diagnosis not present

## 2022-01-01 DIAGNOSIS — D485 Neoplasm of uncertain behavior of skin: Secondary | ICD-10-CM | POA: Diagnosis not present

## 2022-01-01 DIAGNOSIS — L570 Actinic keratosis: Secondary | ICD-10-CM | POA: Diagnosis not present

## 2022-01-23 DIAGNOSIS — Z79891 Long term (current) use of opiate analgesic: Secondary | ICD-10-CM | POA: Diagnosis not present

## 2022-01-23 DIAGNOSIS — G894 Chronic pain syndrome: Secondary | ICD-10-CM | POA: Diagnosis not present

## 2022-01-23 DIAGNOSIS — M150 Primary generalized (osteo)arthritis: Secondary | ICD-10-CM | POA: Diagnosis not present

## 2022-01-23 DIAGNOSIS — M47816 Spondylosis without myelopathy or radiculopathy, lumbar region: Secondary | ICD-10-CM | POA: Diagnosis not present

## 2022-04-02 DIAGNOSIS — Z961 Presence of intraocular lens: Secondary | ICD-10-CM | POA: Diagnosis not present

## 2022-04-02 DIAGNOSIS — H5213 Myopia, bilateral: Secondary | ICD-10-CM | POA: Diagnosis not present

## 2022-04-16 DIAGNOSIS — Z23 Encounter for immunization: Secondary | ICD-10-CM | POA: Diagnosis not present

## 2022-04-23 DIAGNOSIS — G894 Chronic pain syndrome: Secondary | ICD-10-CM | POA: Diagnosis not present

## 2022-04-23 DIAGNOSIS — M47816 Spondylosis without myelopathy or radiculopathy, lumbar region: Secondary | ICD-10-CM | POA: Diagnosis not present

## 2022-04-23 DIAGNOSIS — Z79891 Long term (current) use of opiate analgesic: Secondary | ICD-10-CM | POA: Diagnosis not present

## 2022-04-23 DIAGNOSIS — M150 Primary generalized (osteo)arthritis: Secondary | ICD-10-CM | POA: Diagnosis not present

## 2022-05-15 DIAGNOSIS — Z23 Encounter for immunization: Secondary | ICD-10-CM | POA: Diagnosis not present

## 2022-07-03 DIAGNOSIS — Z1231 Encounter for screening mammogram for malignant neoplasm of breast: Secondary | ICD-10-CM | POA: Diagnosis not present

## 2022-07-04 DIAGNOSIS — M4696 Unspecified inflammatory spondylopathy, lumbar region: Secondary | ICD-10-CM | POA: Diagnosis not present

## 2022-07-04 DIAGNOSIS — I10 Essential (primary) hypertension: Secondary | ICD-10-CM | POA: Diagnosis not present

## 2022-07-04 DIAGNOSIS — R54 Age-related physical debility: Secondary | ICD-10-CM | POA: Diagnosis not present

## 2022-07-04 DIAGNOSIS — D509 Iron deficiency anemia, unspecified: Secondary | ICD-10-CM | POA: Diagnosis not present

## 2022-07-04 DIAGNOSIS — M5136 Other intervertebral disc degeneration, lumbar region: Secondary | ICD-10-CM | POA: Diagnosis not present

## 2022-07-04 DIAGNOSIS — Z79899 Other long term (current) drug therapy: Secondary | ICD-10-CM | POA: Diagnosis not present

## 2022-07-04 DIAGNOSIS — K5150 Left sided colitis without complications: Secondary | ICD-10-CM | POA: Diagnosis not present

## 2022-07-04 DIAGNOSIS — K219 Gastro-esophageal reflux disease without esophagitis: Secondary | ICD-10-CM | POA: Diagnosis not present

## 2022-07-05 ENCOUNTER — Encounter: Payer: Self-pay | Admitting: Obstetrics & Gynecology

## 2022-07-22 DIAGNOSIS — M150 Primary generalized (osteo)arthritis: Secondary | ICD-10-CM | POA: Diagnosis not present

## 2022-07-22 DIAGNOSIS — Z79891 Long term (current) use of opiate analgesic: Secondary | ICD-10-CM | POA: Diagnosis not present

## 2022-07-22 DIAGNOSIS — G894 Chronic pain syndrome: Secondary | ICD-10-CM | POA: Diagnosis not present

## 2022-07-22 DIAGNOSIS — M47816 Spondylosis without myelopathy or radiculopathy, lumbar region: Secondary | ICD-10-CM | POA: Diagnosis not present

## 2022-07-24 ENCOUNTER — Encounter: Payer: Self-pay | Admitting: Obstetrics & Gynecology

## 2022-07-24 ENCOUNTER — Ambulatory Visit (INDEPENDENT_AMBULATORY_CARE_PROVIDER_SITE_OTHER): Payer: Medicare Other | Admitting: Obstetrics & Gynecology

## 2022-07-24 VITALS — BP 130/82 | HR 74 | Resp 16 | Ht 60.75 in | Wt 155.0 lb

## 2022-07-24 DIAGNOSIS — L661 Lichen planopilaris: Secondary | ICD-10-CM | POA: Diagnosis not present

## 2022-07-24 DIAGNOSIS — Z87440 Personal history of urinary (tract) infections: Secondary | ICD-10-CM

## 2022-07-24 DIAGNOSIS — Z780 Asymptomatic menopausal state: Secondary | ICD-10-CM

## 2022-07-24 DIAGNOSIS — Z9189 Other specified personal risk factors, not elsewhere classified: Secondary | ICD-10-CM | POA: Diagnosis not present

## 2022-07-24 DIAGNOSIS — Z90710 Acquired absence of both cervix and uterus: Secondary | ICD-10-CM | POA: Diagnosis not present

## 2022-07-24 DIAGNOSIS — Z01419 Encounter for gynecological examination (general) (routine) without abnormal findings: Secondary | ICD-10-CM

## 2022-07-24 MED ORDER — CIPROFLOXACIN HCL 500 MG PO TABS
500.0000 mg | ORAL_TABLET | Freq: Two times a day (BID) | ORAL | 2 refills | Status: DC
Start: 2022-07-24 — End: 2023-01-14

## 2022-07-24 MED ORDER — CLOBETASOL PROPIONATE 0.05 % EX CREA: TOPICAL_CREAM | CUTANEOUS | 4 refills | Status: DC

## 2022-07-24 NOTE — Progress Notes (Signed)
Crystal Ramsey [birth year] [id number]   History:    87 y.o. G2P1A1L1   RP:  Established patient presenting for annual gyn exam    HPI:  Postmenopausal. Prior TVH and unilateral Oophorectomy.  No pelvic pain. Abstinent. Pap Neg 03/2020.  History of DES use.  No significant history of abnormal Pap smears. Clobetasol for Lichen Sclerosus of Vulva 2-3 times a week.  Breasts normal.  Mammogram 06/2022.  Colonoscopy at age 12.  Dx of Ulcerative Colitis last year, under control.  DEXA Normal in 2015.  History of occasional UTIs and she likes to keep a prescription for ciprofloxacin in the event she has one. BMI 29.53.  Health labs with Fam MD.  Past medical history,surgical history, family history and social history were all reviewed and documented in the EPIC chart.  Gynecologic History No LMP recorded. Patient has had a hysterectomy.  Obstetric History OB History  Gravida Para Term Preterm AB Living  '2 1 1   1 1  '$ SAB IAB Ectopic Multiple Live Births               # Outcome Date GA Lbr Len/2nd Weight Sex Delivery Anes PTL Lv  2 AB           1 Term              ROS: A ROS was performed and pertinent positives and negatives are included in the history. GENERAL: No fevers or chills. HEENT: No change in vision, no earache, sore throat or sinus congestion. NECK: No pain or stiffness. CARDIOVASCULAR: No chest pain or pressure. No palpitations. PULMONARY: No shortness of breath, cough or wheeze. GASTROINTESTINAL: No abdominal pain, nausea, vomiting or diarrhea, melena or bright red blood per rectum. GENITOURINARY: No urinary frequency, urgency, hesitancy or dysuria. MUSCULOSKELETAL: No joint or muscle pain, no back pain, no recent trauma. DERMATOLOGIC: No rash, no itching, no lesions. ENDOCRINE: No polyuria, polydipsia, no heat or cold intolerance. No recent change in weight. HEMATOLOGICAL: No anemia or easy bruising or bleeding. NEUROLOGIC: No headache, seizures, numbness, tingling or weakness.  PSYCHIATRIC: No depression, no loss of interest in normal activity or change in sleep pattern.     Exam:   BP 130/82   Pulse 74   Resp 16   Ht 5' 0.75" (1.543 m)   Wt 155 lb (70.3 kg)   BMI 29.53 kg/m   Body mass index is 29.53 kg/m.  General appearance : Well developed well nourished female. No acute distress HEENT: Eyes: no retinal hemorrhage or exudates,  Neck supple, trachea midline, no carotid bruits, no thyroidmegaly Lungs: Clear to auscultation, no rhonchi or wheezes, or rib retractions  Heart: Regular rate and rhythm, no murmurs or gallops Breast:Examined in sitting and supine position were symmetrical in appearance, no palpable masses or tenderness,  no skin retraction, no nipple inversion, no nipple discharge, no skin discoloration, no axillary or supraclavicular lymphadenopathy Abdomen: no palpable masses or tenderness, no rebound or guarding Extremities: no edema or skin discoloration or tenderness  Pelvic: Vulva: Normal             Vagina: No gross lesions or discharge  Cervix/Uterus absent  Adnexa  Without masses or tenderness  Anus: Normal   Assessment/Plan:  87 y.o. female for annual exam   1. Encounter for gynecological examination without abnormal finding Postmenopausal. Prior TVH and unilateral Oophorectomy.  No pelvic pain. Abstinent. Pap Neg 03/2020.  History of DES use.  No significant history of abnormal Pap smears.  Clobetasol for Lichen Sclerosus of Vulva 2-3 times a week.  Breasts normal.  Mammogram 06/2022.  Colonoscopy at age 33.  Dx of Ulcerative Colitis last year, under control.  DEXA Normal in 2015.  History of occasional UTIs and she likes to keep a prescription for ciprofloxacin in the event she has one. BMI 29.53.  Health labs with Fam MD.  2. Postmenopause Postmenopausal. Prior TVH and unilateral Oophorectomy.  No pelvic pain. Abstinent.   3. Status post total hysterectomy with unilateral Oophorectomy  4. Lichen plano-pilaris Controled with  Clobetasol cream.  5. Hx: UTI (urinary tract infection) Cipro as needed for recurrences of UTIs.  6. DES exposure in utero S/P Total Hysterectomy.  Other orders - clobetasol cream (TEMOVATE) 0.05 %; APPLY TOPICALLY TWICE TO 3 TIMES WEEKLY TO EXTERNAL VULVAR AREA AS NEEDED FOR ITCHING - ciprofloxacin (CIPRO) 500 MG tablet; Take 1 tablet (500 mg total) by mouth 2 (two) times daily for 3 days.   Princess Bruins MD, 2:23 PM

## 2022-10-01 DIAGNOSIS — M150 Primary generalized (osteo)arthritis: Secondary | ICD-10-CM | POA: Diagnosis not present

## 2022-10-01 DIAGNOSIS — G894 Chronic pain syndrome: Secondary | ICD-10-CM | POA: Diagnosis not present

## 2022-10-01 DIAGNOSIS — M47816 Spondylosis without myelopathy or radiculopathy, lumbar region: Secondary | ICD-10-CM | POA: Diagnosis not present

## 2022-10-01 DIAGNOSIS — Z79891 Long term (current) use of opiate analgesic: Secondary | ICD-10-CM | POA: Diagnosis not present

## 2022-10-18 DIAGNOSIS — K5130 Ulcerative (chronic) rectosigmoiditis without complications: Secondary | ICD-10-CM | POA: Diagnosis not present

## 2022-10-25 DIAGNOSIS — R350 Frequency of micturition: Secondary | ICD-10-CM | POA: Diagnosis not present

## 2022-11-11 DIAGNOSIS — K5130 Ulcerative (chronic) rectosigmoiditis without complications: Secondary | ICD-10-CM | POA: Diagnosis not present

## 2022-12-10 DIAGNOSIS — M47816 Spondylosis without myelopathy or radiculopathy, lumbar region: Secondary | ICD-10-CM | POA: Diagnosis not present

## 2022-12-10 DIAGNOSIS — G894 Chronic pain syndrome: Secondary | ICD-10-CM | POA: Diagnosis not present

## 2022-12-10 DIAGNOSIS — Z79891 Long term (current) use of opiate analgesic: Secondary | ICD-10-CM | POA: Diagnosis not present

## 2022-12-10 DIAGNOSIS — M150 Primary generalized (osteo)arthritis: Secondary | ICD-10-CM | POA: Diagnosis not present

## 2022-12-17 DIAGNOSIS — E871 Hypo-osmolality and hyponatremia: Secondary | ICD-10-CM | POA: Diagnosis not present

## 2022-12-23 DIAGNOSIS — K5150 Left sided colitis without complications: Secondary | ICD-10-CM | POA: Diagnosis not present

## 2022-12-23 DIAGNOSIS — Z79899 Other long term (current) drug therapy: Secondary | ICD-10-CM | POA: Diagnosis not present

## 2022-12-23 DIAGNOSIS — Z1331 Encounter for screening for depression: Secondary | ICD-10-CM | POA: Diagnosis not present

## 2022-12-23 DIAGNOSIS — K219 Gastro-esophageal reflux disease without esophagitis: Secondary | ICD-10-CM | POA: Diagnosis not present

## 2022-12-23 DIAGNOSIS — Z0000 Encounter for general adult medical examination without abnormal findings: Secondary | ICD-10-CM | POA: Diagnosis not present

## 2022-12-23 DIAGNOSIS — M5136 Other intervertebral disc degeneration, lumbar region: Secondary | ICD-10-CM | POA: Diagnosis not present

## 2022-12-23 DIAGNOSIS — D509 Iron deficiency anemia, unspecified: Secondary | ICD-10-CM | POA: Diagnosis not present

## 2022-12-23 DIAGNOSIS — I10 Essential (primary) hypertension: Secondary | ICD-10-CM | POA: Diagnosis not present

## 2022-12-23 DIAGNOSIS — E871 Hypo-osmolality and hyponatremia: Secondary | ICD-10-CM | POA: Diagnosis not present

## 2022-12-23 DIAGNOSIS — M4696 Unspecified inflammatory spondylopathy, lumbar region: Secondary | ICD-10-CM | POA: Diagnosis not present

## 2022-12-23 DIAGNOSIS — R54 Age-related physical debility: Secondary | ICD-10-CM | POA: Diagnosis not present

## 2023-01-11 ENCOUNTER — Other Ambulatory Visit: Payer: Self-pay | Admitting: Obstetrics & Gynecology

## 2023-01-11 DIAGNOSIS — Z87440 Personal history of urinary (tract) infections: Secondary | ICD-10-CM

## 2023-01-14 NOTE — Telephone Encounter (Signed)
Former ML pt  Med refill request: Cipro (hx of recurrent UTIs) Last AEX: 07/24/2022 Next AEX: recall placed for 07/2023. Last MMG (if hormonal med) Refill authorized: rx pend.  Per request: last refill picked up on 10/11/2022.

## 2023-02-18 DIAGNOSIS — G894 Chronic pain syndrome: Secondary | ICD-10-CM | POA: Diagnosis not present

## 2023-02-18 DIAGNOSIS — M150 Primary generalized (osteo)arthritis: Secondary | ICD-10-CM | POA: Diagnosis not present

## 2023-02-18 DIAGNOSIS — M47816 Spondylosis without myelopathy or radiculopathy, lumbar region: Secondary | ICD-10-CM | POA: Diagnosis not present

## 2023-02-18 DIAGNOSIS — Z79891 Long term (current) use of opiate analgesic: Secondary | ICD-10-CM | POA: Diagnosis not present

## 2023-02-21 DIAGNOSIS — Z23 Encounter for immunization: Secondary | ICD-10-CM | POA: Diagnosis not present

## 2023-03-17 DIAGNOSIS — M7918 Myalgia, other site: Secondary | ICD-10-CM | POA: Diagnosis not present

## 2023-03-17 DIAGNOSIS — M5416 Radiculopathy, lumbar region: Secondary | ICD-10-CM | POA: Diagnosis not present

## 2023-03-17 DIAGNOSIS — M545 Low back pain, unspecified: Secondary | ICD-10-CM | POA: Diagnosis not present

## 2023-04-01 DIAGNOSIS — L821 Other seborrheic keratosis: Secondary | ICD-10-CM | POA: Diagnosis not present

## 2023-04-01 DIAGNOSIS — L738 Other specified follicular disorders: Secondary | ICD-10-CM | POA: Diagnosis not present

## 2023-04-01 DIAGNOSIS — L6611 Classic lichen planopilaris: Secondary | ICD-10-CM | POA: Diagnosis not present

## 2023-04-01 DIAGNOSIS — D225 Melanocytic nevi of trunk: Secondary | ICD-10-CM | POA: Diagnosis not present

## 2023-04-01 DIAGNOSIS — D1801 Hemangioma of skin and subcutaneous tissue: Secondary | ICD-10-CM | POA: Diagnosis not present

## 2023-04-07 DIAGNOSIS — M545 Low back pain, unspecified: Secondary | ICD-10-CM | POA: Diagnosis not present

## 2023-04-14 DIAGNOSIS — M791 Myalgia, unspecified site: Secondary | ICD-10-CM | POA: Diagnosis not present

## 2023-04-14 DIAGNOSIS — M545 Low back pain, unspecified: Secondary | ICD-10-CM | POA: Diagnosis not present

## 2023-04-14 DIAGNOSIS — M5416 Radiculopathy, lumbar region: Secondary | ICD-10-CM | POA: Diagnosis not present

## 2023-04-16 DIAGNOSIS — M47816 Spondylosis without myelopathy or radiculopathy, lumbar region: Secondary | ICD-10-CM | POA: Diagnosis not present

## 2023-04-16 DIAGNOSIS — M150 Primary generalized (osteo)arthritis: Secondary | ICD-10-CM | POA: Diagnosis not present

## 2023-04-16 DIAGNOSIS — G894 Chronic pain syndrome: Secondary | ICD-10-CM | POA: Diagnosis not present

## 2023-04-16 DIAGNOSIS — M5431 Sciatica, right side: Secondary | ICD-10-CM | POA: Diagnosis not present

## 2023-07-21 ENCOUNTER — Encounter: Payer: Self-pay | Admitting: Obstetrics and Gynecology

## 2023-07-29 ENCOUNTER — Ambulatory Visit (INDEPENDENT_AMBULATORY_CARE_PROVIDER_SITE_OTHER): Payer: Medicare Other | Admitting: Obstetrics and Gynecology

## 2023-07-29 ENCOUNTER — Encounter: Payer: Self-pay | Admitting: Obstetrics and Gynecology

## 2023-07-29 VITALS — BP 120/78 | HR 93 | Ht 60.75 in

## 2023-07-29 DIAGNOSIS — E2839 Other primary ovarian failure: Secondary | ICD-10-CM

## 2023-07-29 DIAGNOSIS — N952 Postmenopausal atrophic vaginitis: Secondary | ICD-10-CM | POA: Diagnosis not present

## 2023-07-29 DIAGNOSIS — Z9189 Other specified personal risk factors, not elsewhere classified: Secondary | ICD-10-CM | POA: Diagnosis not present

## 2023-07-29 DIAGNOSIS — Z779 Other contact with and (suspected) exposures hazardous to health: Secondary | ICD-10-CM

## 2023-07-29 DIAGNOSIS — L900 Lichen sclerosus et atrophicus: Secondary | ICD-10-CM | POA: Diagnosis not present

## 2023-07-29 DIAGNOSIS — Z01419 Encounter for gynecological examination (general) (routine) without abnormal findings: Secondary | ICD-10-CM

## 2023-07-29 MED ORDER — CLOBETASOL PROPIONATE 0.05 % EX CREA: TOPICAL_CREAM | CUTANEOUS | 4 refills | Status: AC

## 2023-07-29 NOTE — Progress Notes (Signed)
 88 y.o. y.o. female here for annual exam. No LMP recorded. Patient has had a hysterectomy.     [id number]   RP:  Established patient presenting for annual gyn exam    HPI:  Postmenopausal. Prior TVH and unilateral Oophorectomy.  No pelvic pain. Abstinent. Pap Neg 03/2020.  History of DES use.  No significant history of abnormal Pap smears. Clobetasol for Lichen Sclerosus of Vulva 2-3 times a week.  Breasts normal.  Mammogram 06/2022.  Colonoscopy at age 44.  Dx of Ulcerative Colitis last year, under control.  DEXA Normal in 2015 declines any further testing or treatment despite counseling.  History of occasional UTIs and she likes to keep a prescription for ciprofloxacin in the event she has one. BMI 29.53 last visit.  Health labs with Fam MD.  Past medical history,surgical history, family history and social history were all reviewed and documented in the EPIC chart. Body mass index is 29.53 kg/m.      No data to display          Height 5' 0.75" (1.543 m).  No results found for: "DIAGPAP", "HPVHIGH", "ADEQPAP"  GYN HISTORY: No results found for: "DIAGPAP", "HPVHIGH", "ADEQPAP"  OB History  Gravida Para Term Preterm AB Living  2 1 1  1 1   SAB IAB Ectopic Multiple Live Births          # Outcome Date GA Lbr Len/2nd Weight Sex Type Anes PTL Lv  2 AB           1 Term             Past Medical History:  Diagnosis Date   Arthritis    Back pain    Colitis    Complication of anesthesia    Constipation, chronic    DES exposure in utero    Family history of adverse reaction to anesthesia    VOMITING   Fibromyalgia    PT THINKS SHE HAS FIBROMYALGIA   GERD (gastroesophageal reflux disease)    High risk HPV infection 12/2012   Pap smear cytology normal with positive HR HPV   Hypertension    Lichen plano-pilaris    PONV (postoperative nausea and vomiting)    Ulcerative colitis (HCC)     Past Surgical History:  Procedure Laterality Date   ABDOMINAL SURGERY      Laparotomy   BIOPSY  04/25/2020   Procedure: BIOPSY;  Surgeon: Charlott Rakes, MD;  Location[facility] ENDOSCOPY;  Service: Endoscopy;;   BREAST SURGERY     Biopsy-benign   CATARACT EXTRACTION     FLEXIBLE SIGMOIDOSCOPY N/A 04/25/2020   Procedure: FLEXIBLE SIGMOIDOSCOPY;  Surgeon: Charlott Rakes, MD;  Location: [facility] ENDOSCOPY;  Service: Endoscopy;  Laterality: N/A;   OOPHORECTOMY     LSO   PELVIC LAPAROSCOPY     DL   TOTAL HIP ARTHROPLASTY Right 09/07/2014   Procedure: RIGHT TOTAL HIP ARTHROPLASTY ANTERIOR APPROACH;  Surgeon: Ollen Gross, MD;  Location: [facility] ORS;  Service: Orthopedics;  Laterality: Right;   TOTAL HIP ARTHROPLASTY Left 12/13/2015   Procedure: LEFT TOTAL HIP ARTHROPLASTY ANTERIOR APPROACH;  Surgeon: Ollen Gross, MD;  Location: [facility] ORS;  Service: Orthopedics;  Laterality: Left;   TOTAL KNEE ARTHROPLASTY  05/25/2012   Procedure: TOTAL KNEE ARTHROPLASTY;  Surgeon: Loanne Drilling, MD;  Location: [facility] ORS;  Service: Orthopedics;  Laterality: Right;   TOTAL KNEE ARTHROPLASTY Left 07/19/2019   Procedure: TOTAL KNEE ARTHROPLASTY;  Surgeon: Ollen Gross, MD;  Location: [facility] ORS;  Service: Orthopedics;  Laterality: Left;    VAGINAL HYSTERECTOMY     varicose vein ligation      Current Outpatient Medications on File Prior to Visit  Medication Sig Dispense Refill   amiloride-hydrochlorothiazide (MODURETIC) 5-50 MG tablet Take 1 tablet by mouth daily.     B Complex Vitamins (B COMPLEX PO) Take by mouth 2 (two) times a week.     calcium carbonate (TUMS - DOSED IN MG ELEMENTAL CALCIUM) 500 MG chewable tablet Chew 2 tablets by mouth 3 (three) times daily as needed for indigestion or heartburn.     clobetasol cream (TEMOVATE) 0.05 % APPLY TOPICALLY TWICE TO 3 TIMES WEEKLY TO EXTERNAL VULVAR AREA AS NEEDED FOR ITCHING 30 g 4   diphenhydrAMINE HCl (BENADRYL PO)      Ferrous Sulfate (IRON PO) Take by mouth.     HYDROcodone-acetaminophen (NORCO) 7.5-325 MG tablet Take 1-2 tablets by mouth  every 4 (four) hours as needed for severe pain. 40 tablet 0   mesalamine (LIALDA) 1.2 g EC tablet Take 4 tablets (4.8 g total) by mouth daily with breakfast. (Patient taking differently: Take 1.2 g by mouth daily with breakfast. Takes 4) 120 tablet 0   Multiple Vitamin (MULTIVITAMIN WITH MINERALS) TABS tablet Take 1 tablet by mouth daily.     omeprazole (PRILOSEC OTC) 20 MG tablet Take 1 tablet (20 mg total) by mouth daily. 30 tablet 0   polyethylene glycol (MIRALAX / GLYCOLAX) 17 g packet Take 17 g by mouth daily. 30 each 0   ciprofloxacin (CIPRO) 500 MG tablet TAKE 1 TABLET(500 MG) BY MOUTH TWICE DAILY FOR 3 DAYS (Patient not taking: Reported on 07/29/2023) 6 tablet 2   potassium chloride (KLOR-CON M) 10 MEQ tablet 3 tablet with food Orally once a day for 90 days     No current facility-administered medications on file prior to visit.    Social History   Socioeconomic History   Marital status: Married    Spouse name: Not on file   Number of children: Not on file   Years of education: Not on file   Highest education level: Not on file  Occupational History   Not on file  Tobacco Use   Smoking status: Former    Current packs/day: 0.00    Types: Cigarettes    Quit date: 05/16/1983    Years since quitting: 40.2   Smokeless tobacco: Never  Vaping Use   Vaping status: Never Used  Substance and Sexual Activity   Alcohol use: Yes    Comment: WINE   Drug use: No   Sexual activity: Not Currently    Partners: Male    Birth control/protection: Surgical    Comment: 1st intercourse 88 yo-Fewer than 5 partners, hysterectomy  Other Topics Concern   Not on file  Social History Narrative   Not on file   Social Drivers of Health   Financial Resource Strain: Not on file  Food Insecurity: Not on file  Transportation Needs: Not on file  Physical Activity: Not on file  Stress: Not on file  Social Connections: Unknown (07/10/2022)   Received from [facility], [facility]   Social  Network    Social Network: Not on file  Intimate Partner Violence: Unknown (07/10/2022)   Received from [facility], [facility]   HITS    Physically Hurt: Not on file    Insult or Talk Down To: Not on file    Threaten Physical Harm: Not on file    Scream or Curse:  Not on file    Family History  Problem Relation Age of Onset   Uterine cancer Mother    Hypertension Father    Lung cancer Brother    Hypertension Sister    Breast cancer Sister        Age 15   Breast cancer Paternal Aunt        Age 25   Stomach cancer Maternal Uncle    Lung cancer Paternal Aunt    Breast cancer Daughter 81     Allergies  Allergen Reactions   Other Other (See Comments)    CONTRAST DYE-unknown   Amlodipine Besylate     Other reaction(s): insomnia, edema   Covid-19 (Mrna) Vaccine     Sciatica on leg that caused weakness from injection   Demerol [Meperidine] Itching and Nausea And Vomiting   Dilaudid [Hydromorphone Hcl] Itching and Nausea And Vomiting   Lisinopril     Other reaction(s): cough   Macrodantin [Nitrofurantoin] Other (See Comments)    unknown   Sulfa Antibiotics Other (See Comments)    unknown   Valsartan-Hydrochlorothiazide     Other reaction(s): insomnia   Zithromax [Azithromycin] Other (See Comments)    flu   Chlorhexidine Gluconate Rash   Penicillins Other (See Comments)    Has patient had a PCN reaction causing immediate rash, facial/tongue/throat swelling, SOB or lightheadedness with hypotension: NO Has patient had a PCN reaction causing severe rash involving mucus membranes or skin necrosis: unknown Has patient had a PCN reaction that required hospitalization: unknown Has patient had a PCN reaction occurring within the last 10 years: No If all of the above answers are "NO", then may proceed with Cephalosporin use.  Unknown  Think it was vomiting, NO anaphylaxis      Patient's last menstrual period was No LMP recorded. Patient has had a hysterectomy..             Review of Systems Alls systems reviewed and are negative.     Physical Exam Constitutional:      Appearance: Normal appearance.  Genitourinary:     Moderate vaginal atrophy present.    Cervix is absent.     Uterus is absent.  Cardiovascular:     Rate and Rhythm: Normal rate and regular rhythm.  Pulmonary:     Effort: Pulmonary effort is normal.     Breath sounds: Normal breath sounds.  Musculoskeletal:     Cervical back: Normal range of motion.  Neurological:     Mental Status: She is alert and oriented to person, place, and time.  Psychiatric:        Mood and Affect: Mood normal.        Behavior: Behavior normal.  Vitals and nursing note reviewed.    Joy, MA was present for entire exam   A:         Medicare breast and pelvic exam                             P:        Pap smear not indicated Encouraged annual mammogram screening Colon cancer screening aged out DXA declines testing or treatment, despite counseling on importance and risk for fracture. Labs and immunizations to do with PMD Encouraged healthy lifestyle practices Encouraged Vit D and Calcium   No follow-ups on file.  Earley Favor

## 2023-07-30 ENCOUNTER — Telehealth: Payer: Self-pay | Admitting: Obstetrics and Gynecology

## 2023-07-30 NOTE — Telephone Encounter (Signed)
-----   Message from Earley Favor sent at 07/30/2023 10:54 AM EDT ----- Regarding: RE: B& P No she had hysterectomy. Mother with DES exposure, but no sequela in patient. Dr. Karma Greaser ----- Message ----- From: Jerilynn Mages Sent: 07/30/2023   9:00 AM EDT To: Earley Favor, MD Subject: B& P                                           Patient is high risk/ does she need to return in  year?

## 2024-06-03 ENCOUNTER — Ambulatory Visit (INDEPENDENT_AMBULATORY_CARE_PROVIDER_SITE_OTHER): Admitting: Otolaryngology

## 2024-06-03 ENCOUNTER — Encounter (INDEPENDENT_AMBULATORY_CARE_PROVIDER_SITE_OTHER): Payer: Self-pay | Admitting: Otolaryngology

## 2024-06-03 VITALS — BP 177/83 | HR 88

## 2024-06-03 DIAGNOSIS — H903 Sensorineural hearing loss, bilateral: Secondary | ICD-10-CM | POA: Diagnosis not present

## 2024-06-03 DIAGNOSIS — H6983 Other specified disorders of Eustachian tube, bilateral: Secondary | ICD-10-CM | POA: Diagnosis not present

## 2024-06-03 DIAGNOSIS — H6123 Impacted cerumen, bilateral: Secondary | ICD-10-CM

## 2024-06-04 DIAGNOSIS — H6983 Other specified disorders of Eustachian tube, bilateral: Secondary | ICD-10-CM | POA: Insufficient documentation

## 2024-06-04 DIAGNOSIS — H903 Sensorineural hearing loss, bilateral: Secondary | ICD-10-CM | POA: Insufficient documentation

## 2024-06-04 DIAGNOSIS — H6123 Impacted cerumen, bilateral: Secondary | ICD-10-CM | POA: Insufficient documentation

## 2024-06-04 NOTE — Progress Notes (Signed)
 CC: Hearing loss, eustachian tube dysfunction  Discussed the use of AI scribe software for clinical note transcription with the patient, who gave verbal consent to proceed.  History of Present Illness Crystal Ramsey is an 89 year old female with bilateral sensorineural hearing loss and eustachian tube dysfunction who presents for evaluation of worsening hearing.  She reports persistent hearing impairment that has not improved and may have worsened since her last evaluation approximately four years ago. Her hearing fluctuates, with subjective worsening during allergy season, accompanied by a sensation of her head being filled with water . She denies otalgia and otorrhea. She has not used hearing aids, though she was previously evaluated for them but did not proceed with their use.  A prior audiogram in 2022 demonstrated moderate sensorineural hearing loss at low frequencies and severe loss at high frequencies bilaterally. At that time, middle ear effusion was noted on the right. She previously used intranasal corticosteroid spray for eustachian tube dysfunction, but discontinued due to ocular irritation. She denies a history of glaucoma and is not currently using any otologic medications.  She notes the left ear is more prone to cerumen accumulation than the right. She uses cotton swabs  for ear cleaning and has not had professional cerumen removal in several years, suspecting impaction due to the duration since her last cleaning. She inquires about appropriate ear hygiene.     Past Medical History:  Diagnosis Date   Arthritis    Back pain    Colitis    Complication of anesthesia    Constipation, chronic    DES exposure in utero    Family history of adverse reaction to anesthesia    VOMITING   Fibromyalgia    PT THINKS SHE HAS FIBROMYALGIA   GERD (gastroesophageal reflux disease)    High risk HPV infection 12/2012   Pap smear cytology normal with positive HR HPV   Hypertension     Lichen plano-pilaris    PONV (postoperative nausea and vomiting)    Ulcerative colitis (HCC)     Past Surgical History:  Procedure Laterality Date   ABDOMINAL SURGERY     Laparotomy   BIOPSY  04/25/2020   Procedure: BIOPSY;  Surgeon: Dianna Specking, MD;  Location: [facility] ENDOSCOPY;  Service: Endoscopy;;   BREAST SURGERY     Biopsy-benign   CATARACT EXTRACTION     FLEXIBLE SIGMOIDOSCOPY N/A 04/25/2020   Procedure: FLEXIBLE SIGMOIDOSCOPY;  Surgeon: Dianna Specking, MD;  Location: [facility] ENDOSCOPY;  Service: Endoscopy;  Laterality: N/A;   OOPHORECTOMY     LSO   PELVIC LAPAROSCOPY     DL   TOTAL HIP ARTHROPLASTY Right 09/07/2014   Procedure: RIGHT TOTAL HIP ARTHROPLASTY ANTERIOR APPROACH;  Surgeon: Dempsey Moan, MD;  Location: [facility] ORS;  Service: Orthopedics;  Laterality: Right;   TOTAL HIP ARTHROPLASTY Left 12/13/2015   Procedure: LEFT TOTAL HIP ARTHROPLASTY ANTERIOR APPROACH;  Surgeon: Dempsey Moan, MD;  Location: [facility] ORS;  Service: Orthopedics;  Laterality: Left;   TOTAL KNEE ARTHROPLASTY  05/25/2012   Procedure: TOTAL KNEE ARTHROPLASTY;  Surgeon: Dempsey LULLA Moan, MD;  Location: [facility] ORS;  Service: Orthopedics;  Laterality: Right;   TOTAL KNEE ARTHROPLASTY Left 07/19/2019   Procedure: TOTAL KNEE ARTHROPLASTY;  Surgeon: Moan Dempsey, MD;  Location: [facility];  Service: Orthopedics;  Laterality: Left;    VAGINAL HYSTERECTOMY     varicose vein ligation      Family History  Problem Relation Age of Onset   Uterine cancer Mother    Hypertension Father  Lung cancer Brother    Hypertension Sister    Breast cancer Sister        Age 70   Breast cancer Paternal Aunt        Age 48   Stomach cancer Maternal Uncle    Lung cancer Paternal Aunt    Breast cancer Daughter 22    Social History:  reports that she quit smoking about 41 years ago. Her smoking use included cigarettes. She has never used smokeless tobacco. She reports current alcohol use. She reports that she does not use  drugs.  Allergies: Allergies[1]  Prior to Admission medications  Medication Sig Start Date End Date Taking? Authorizing Provider  amiloride -hydrochlorothiazide  (MODURETIC) 5-50 MG tablet Take 1 tablet by mouth daily.    [provider]  B Complex Vitamins (B COMPLEX PO) Take by mouth 2 (two) times a week.    [provider]  calcium  carbonate (TUMS - DOSED IN MG ELEMENTAL CALCIUM ) 500 MG chewable tablet Chew 2 tablets by mouth 3 (three) times daily as needed for indigestion or heartburn.    [provider]  ciprofloxacin  (CIPRO ) 500 MG tablet TAKE 1 TABLET(500 MG) BY MOUTH TWICE DAILY FOR 3 DAYS Patient not taking: Reported on 06/03/2024 01/14/23   Prentiss Riggs A, NP  clobetasol  cream (TEMOVATE ) 0.05 % APPLY TOPICALLY TWICE TO 3 TIMES WEEKLY TO EXTERNAL VULVAR AREA AS NEEDED FOR ITCHING 07/29/23   Glennon Almarie POUR, MD  diphenhydrAMINE  HCl (BENADRYL  PO)  03/08/21   [provider]  Ferrous Sulfate (IRON PO) Take by mouth.    [provider]  HYDROcodone -acetaminophen  (NORCO) 7.5-325 MG tablet Take 1-2 tablets by mouth every 4 (four) hours as needed for severe pain. 07/20/19   [place], [facility], PA-C  mesalamine  (LIALDA ) 1.2 g EC tablet Take 4 tablets (4.8 g total) by mouth daily with breakfast. Patient taking differently: Take 1.2 g by mouth daily with breakfast. Takes 4 05/02/20   Hongalgi, Anand D, MD  Multiple Vitamin (MULTIVITAMIN WITH MINERALS) TABS tablet Take 1 tablet by mouth daily.    [provider]  omeprazole  (PRILOSEC  OTC) 20 MG tablet Take 1 tablet (20 mg total) by mouth daily. 05/01/20   Hongalgi, Anand D, MD  polyethylene glycol (MIRALAX  / GLYCOLAX ) 17 g packet Take 17 g by mouth daily. 05/02/20   Hongalgi, Anand D, MD  potassium chloride  (KLOR-CON  M) 10 MEQ tablet 3 tablet with food Orally once a day for 90 days    [provider]    Blood pressure (!) 177/83, pulse 88, SpO2 95%. Exam: General: Communicates  without difficulty, well nourished, no acute distress. Head: Normocephalic, no evidence injury, no tenderness, facial buttresses intact without stepoff. Face/sinus: No tenderness to palpation and percussion. Facial movement is normal and symmetric. Eyes: PERRL, EOMI. No scleral icterus, conjunctivae clear. Neuro: CN II exam reveals vision grossly intact.  No nystagmus at any point of gaze. Ears: Auricles well formed without lesions.  Bilateral cerumen impaction.  Nose: External evaluation reveals normal support and skin without lesions.  Dorsum is intact.  Anterior rhinoscopy reveals congested mucosa over anterior aspect of inferior turbinates and intact septum.  No purulence noted. Oral:  Oral cavity and oropharynx are intact, symmetric, without erythema or edema.  Mucosa is moist without lesions. Neck: Full range of motion without pain.  There is no significant lymphadenopathy.  No masses palpable.  Thyroid  bed within normal limits to palpation.  Parotid glands and submandibular glands equal bilaterally without mass.  Trachea is  midline. Neuro:  CN 2-12 grossly intact.   Procedure: Bilateral cerumen disimpaction Anesthesia: None Description: Under the operating microscope, the cerumen is carefully removed with a combination of cerumen currette, alligator forceps, and suction catheters.  After the cerumen is removed, the TMs are noted to be normal.  No mass, erythema, or lesions. The patient tolerated the procedure well.   Assessment & Plan Bilateral sensorineural hearing loss Bilateral sensorineural hearing loss with subjective worsening and impaired speech discrimination. Hearing loss fluctuates with allergy symptoms. She has not previously used hearing aids. - Ordered audiometric evaluation to reassess hearing status and compare to prior results. - Discussed potential future consideration of hearing aids if hearing loss remains bothersome. - Recommended follow-up in six months.  Bilateral impacted  cerumen Significant bilateral impacted cerumen, more pronounced on the left, with hard wax adherent to the tympanic membranes, contributing to hearing impairment. Cerumen removal resulted in immediate subjective improvement in hearing. - Otomicroscopy with bilateral cerumen disimpaction. - Provided education on ear hygiene, advising against use of cotton swabs  and recommending cleaning only the outer ear with a washcloth.  Bilateral eustachian tube dysfunction Bilateral eustachian tube dysfunction, previously associated with right-sided middle ear effusion. She is unable to tolerate intranasal steroids due to ocular irritation. Eustachian tube dysfunction may contribute to fluctuating hearing loss, especially during allergy season. - Instructed on performing Valsalva maneuvers 20-30 times daily to promote eustachian tube patency.   Ridley Schewe W Omero Kowal 06/04/2024, 8:13 PM      [1]  Allergies Allergen Reactions   Other Other (See Comments)    CONTRAST DYE-unknown   Amlodipine  Besylate     Other reaction(s): insomnia, edema   Covid-19 (Mrna) Vaccine     Sciatica on leg that caused weakness from injection   Demerol [Meperidine] Itching and Nausea And Vomiting   Dilaudid  [Hydromorphone  Hcl] Itching and Nausea And Vomiting   Lisinopril     Other reaction(s): cough   Macrodantin [Nitrofurantoin] Other (See Comments)    unknown   Sulfa Antibiotics Other (See Comments)    unknown   Valsartan-Hydrochlorothiazide      Other reaction(s): insomnia   Zithromax [Azithromycin] Other (See Comments)    flu   Chlorhexidine  Gluconate Rash   Penicillins Other (See Comments)    Has patient had a PCN reaction causing immediate rash, facial/tongue/throat swelling, SOB or lightheadedness with hypotension: NO Has patient had a PCN reaction causing severe rash involving mucus membranes or skin necrosis: unknown Has patient had a PCN reaction that required hospitalization: unknown Has patient had a PCN reaction  occurring within the last 10 years: No If all of the above answers are NO, then may proceed with Cephalosporin use.  Unknown  Think it was vomiting, NO anaphylaxis

## 2024-07-09 ENCOUNTER — Ambulatory Visit (INDEPENDENT_AMBULATORY_CARE_PROVIDER_SITE_OTHER): Admitting: Audiology

## 2024-08-09 ENCOUNTER — Encounter: Admitting: Obstetrics and Gynecology

## 2024-12-02 ENCOUNTER — Ambulatory Visit (INDEPENDENT_AMBULATORY_CARE_PROVIDER_SITE_OTHER): Admitting: Otolaryngology
